# Patient Record
Sex: Male | Born: 1950 | Race: White | Hispanic: No | Marital: Married | Smoking: Current every day smoker
Health system: Southern US, Community
[De-identification: ages and names within clinical notes are randomized; demographics above are authoritative.]

## PROBLEM LIST (undated history)

## (undated) DIAGNOSIS — R7303 Prediabetes: Secondary | ICD-10-CM

## (undated) DIAGNOSIS — M1990 Unspecified osteoarthritis, unspecified site: Secondary | ICD-10-CM

## (undated) DIAGNOSIS — E559 Vitamin D deficiency, unspecified: Secondary | ICD-10-CM

## (undated) DIAGNOSIS — Z87442 Personal history of urinary calculi: Secondary | ICD-10-CM

## (undated) DIAGNOSIS — K649 Unspecified hemorrhoids: Secondary | ICD-10-CM

## (undated) DIAGNOSIS — F419 Anxiety disorder, unspecified: Secondary | ICD-10-CM

## (undated) DIAGNOSIS — L981 Factitial dermatitis: Secondary | ICD-10-CM

## (undated) DIAGNOSIS — E349 Endocrine disorder, unspecified: Secondary | ICD-10-CM

## (undated) DIAGNOSIS — I10 Essential (primary) hypertension: Principal | ICD-10-CM

## (undated) DIAGNOSIS — D126 Benign neoplasm of colon, unspecified: Secondary | ICD-10-CM

## (undated) HISTORY — DX: Morbid (severe) obesity due to excess calories: E66.01

## (undated) HISTORY — DX: Factitial dermatitis: L98.1

## (undated) HISTORY — DX: Benign neoplasm of colon, unspecified: D12.6

## (undated) HISTORY — PX: UMBILICAL HERNIA REPAIR: SHX196

## (undated) HISTORY — DX: Endocrine disorder, unspecified: E34.9

## (undated) HISTORY — DX: Vitamin D deficiency, unspecified: E55.9

## (undated) HISTORY — DX: Essential (primary) hypertension: I10

## (undated) HISTORY — DX: Prediabetes: R73.03

## (undated) HISTORY — PX: KIDNEY STONE SURGERY: SHX686

## (undated) HISTORY — DX: Unspecified hemorrhoids: K64.9

---

## 2009-04-01 LAB — HM COLONOSCOPY

## 2009-11-11 HISTORY — PX: SHOULDER SURGERY: SHX246

## 2011-06-29 ENCOUNTER — Ambulatory Visit (INDEPENDENT_AMBULATORY_CARE_PROVIDER_SITE_OTHER): Payer: BC Managed Care – PPO | Admitting: Family Medicine

## 2011-06-29 ENCOUNTER — Encounter: Payer: Self-pay | Admitting: Family Medicine

## 2011-06-29 DIAGNOSIS — T1490XA Injury, unspecified, initial encounter: Secondary | ICD-10-CM

## 2011-06-29 DIAGNOSIS — I10 Essential (primary) hypertension: Secondary | ICD-10-CM

## 2011-06-29 DIAGNOSIS — R21 Rash and other nonspecific skin eruption: Secondary | ICD-10-CM

## 2011-06-29 DIAGNOSIS — IMO0002 Reserved for concepts with insufficient information to code with codable children: Secondary | ICD-10-CM

## 2011-06-29 HISTORY — DX: Essential (primary) hypertension: I10

## 2011-06-29 NOTE — Progress Notes (Signed)
Subjective:    Patient ID: Erik Estrada, male    DOB: 02-Sep-1951, 60 y.o.   MRN: [id number]  HPI Recently dx with high blood pressure, put on med ( he can't remember the name). Says it made his muscle feel like jelly. Stayed on it for 1 mo. Also felt unfocused on the med.  Then given a new rx and he never had it filled because of the sun sensitivity adn he works outside all the time.    Has had scalp issues for years ago. Daughter had similar issues.  Currently using clindamycin gel which does seem to be working. He says he recently saw a dermatologist for this who prescribed this medication. He was noted if he could have a scan of his pituitary gland. He did some reading online and noted that this could cause a lot of the skin condition similar to what he has and to see if this a possible cause. He has had the symptoms from his 30 years.  He complains that he has gotten some metal fragments in his skin. 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 In fact there is a lesion near his umbilicus as well as a lesion on his left inner earlobe that he says he tried to cut open himself to get out the middle fragment. He actually takes a piece of the metal fragment or piece of paper to show me. He also saw a r dermatologist for for this who gave him a prescription medicated type of tape to use over the lesions for approximately one month to see if this would help.   Review of Systems  Constitutional: Negative for fever, diaphoresis and unexpected weight change.  HENT: Negative for hearing loss, rhinorrhea, sneezing and tinnitus.   Eyes: Negative for visual disturbance.  Respiratory: Negative for cough and wheezing.   Cardiovascular: Negative for chest pain and palpitations.  Gastrointestinal: Negative for nausea, vomiting, diarrhea and blood in stool.  Genitourinary: Negative for dysuria and discharge.  Musculoskeletal: Positive for myalgias. Negative for arthralgias.  Skin: Negative for rash.    Neurological: Negative for headaches.  Hematological: Negative for adenopathy.  Psychiatric/Behavioral: Positive for sleep disturbance. Negative for dysphoric mood. The patient is nervous/anxious.     BP 129/86  Pulse 89  Ht 5\' 4"  (1.626 m)  Wt 214 lb (97.07 kg)  BMI 36.73 kg/m2  SpO2 96%    Not on File  No past medical history on file.  Past Surgical History  Procedure Date  . Shoulder surgery December 2010    Left  . Kidney stone surgery   . Umbilical hernia repair     History   Social History  . Marital Status: Unknown    Spouse Name: Erik Estrada    Number of Children: 2  . Years of Education: N/A   Occupational History  . Auto Body Tech     [employer]   Social History Main Topics  . Smoking status: Current Everyday Smoker -- 2.0 packs/day    Types: Cigarettes  . Smokeless tobacco: Not on file  . Alcohol Use: No  . Drug Use: No  . Sexually Active: Yes -- Male partner(s)   Other Topics Concern  . Not on file   Social History Narrative   2 caffeinated drinks daily. No regular exercise he does have a physical job.    Family History  Problem Relation Age of Onset  . Colon cancer Brother   . Hypertension Mother   . Stroke Brother  Mr. Erik Estrada does not currently have medications on file.      Objective:   Physical Exam  Constitutional: He is oriented to person, place, and time. He appears well-developed and well-nourished.  HENT:  Head: Normocephalic and atraumatic.  Neck: Neck supple. No thyromegaly present.  Cardiovascular: Normal rate, regular rhythm and normal heart sounds.        No carotid bruits  Pulmonary/Chest: Effort normal and breath sounds normal.  Lymphadenopathy:    He has no cervical adenopathy.  Neurological: He is alert and oriented to person, place, and time.  Skin: Skin is warm and dry.       On his scalp he has several erythematous papular lesions that he is clearly scratch to have small scabs in the center. No active  sign of impetigo or infection or drainage. On his left inner earlobe he has a slice mark which appears to be actually down to the cartilage. It is very tender to touch but no sign of infection or drainage. Around the umbilicus he actually has 2 lesions that appear somewhat ulcerated patch he has also tried to cut to remove metal fragments. There again no sign of active infection or cellulitis. He also has a smaller lesion on his left jaw that is erythematous and is clearly been taking as well.  Psychiatric: He has a normal mood and affect. His behavior is normal.          Assessment & Plan:  Scalp rash-I really think that this is self-induced. I do think the clindamycin gel helps mainly because he scratches at the areas and creates mild infections and hair follicles. I do think he has some scarring as well. I would like him to start in acetylcysteine 600 mg twice a day to see if this helps her for the next month. He can certainly continue with the clindamycin topical treatment as well. I explained to him that I do not think this is related to his pituitary gland. He was somewhat fearful that he could have a tumor but I explained that after 30 years but that tumor would have caused a lot of problems in his neurologic system including seizures, vision changes, abnormal changes as well. Thus it is extraordinarily unlikely for this to be related to his pituitary gland.  Foreign body-metal fragments-at this point time I don't see any evidence of metal fragments and certainly they could be embedded in the skin. I do think he should call the dermatologist instructions to continue using the taper of the lesions and to not pick at them. I explained to him that oftentimes foreign bodies will be push themselves out of the body on their own. And certainly in the next month he would be able to tell this. Also let him know that we could get an x-ray of his ear if he is still worried about a metal fragment. I explained  him that this point is still very tender because it is trying to heal and it is an open wound.

## 2011-06-29 NOTE — Assessment & Plan Note (Signed)
His blood pressure is just borderline today. We discussed following the DASH diet. Followup in one month for repeat check. If it is elevated at the next visit then I would like to start medication. We will try avoid medications that will increase his sun sensitivity since he does work outside. I also want him to get me the name of the first blood pressure prescription so that I don't prescribe the exact same 1. We will try to get a copy of his records.

## 2011-06-29 NOTE — Patient Instructions (Addendum)
NAC (N-acetyl cysteine) 600 twice a day for one month.   Can try B 12 complex.  DASH diet (google that .nih/gov) - Goes over a low sodium diet.   Work on weight loss.

## 2011-07-20 ENCOUNTER — Encounter: Payer: Self-pay | Admitting: Family Medicine

## 2011-07-20 DIAGNOSIS — Z720 Tobacco use: Secondary | ICD-10-CM | POA: Insufficient documentation

## 2011-07-20 DIAGNOSIS — E785 Hyperlipidemia, unspecified: Secondary | ICD-10-CM | POA: Insufficient documentation

## 2011-07-20 DIAGNOSIS — D72829 Elevated white blood cell count, unspecified: Secondary | ICD-10-CM | POA: Insufficient documentation

## 2011-07-20 DIAGNOSIS — E291 Testicular hypofunction: Secondary | ICD-10-CM | POA: Insufficient documentation

## 2011-08-04 ENCOUNTER — Ambulatory Visit (INDEPENDENT_AMBULATORY_CARE_PROVIDER_SITE_OTHER): Payer: BC Managed Care – PPO | Admitting: Family Medicine

## 2011-08-04 ENCOUNTER — Encounter: Payer: Self-pay | Admitting: Family Medicine

## 2011-08-04 VITALS — BP 150/87 | HR 77 | Wt 217.0 lb

## 2011-08-04 DIAGNOSIS — Z0000 Encounter for general adult medical examination without abnormal findings: Secondary | ICD-10-CM

## 2011-08-04 DIAGNOSIS — I10 Essential (primary) hypertension: Secondary | ICD-10-CM

## 2011-08-04 LAB — TSH: TSH: 1.502 u[IU]/mL (ref 0.350–4.500)

## 2011-08-04 LAB — LIPID PANEL
HDL: 40 mg/dL (ref >39)
Total CHOL/HDL Ratio: 5 Ratio

## 2011-08-04 MED ORDER — LISINOPRIL 20 MG PO TABS: 20.0000 mg | ORAL_TABLET | Freq: Every day | ORAL | Status: DC

## 2011-08-04 MED ORDER — HYDROCHLOROTHIAZIDE 25 MG PO TABS: 25.0000 mg | ORAL_TABLET | Freq: Every day | ORAL | Status: DC

## 2011-08-04 NOTE — Progress Notes (Signed)
  Subjective:    Patient ID: Erik Estrada, male    DOB: January 13, 1951, 60 y.o.   MRN: [id number]  HPI Here for CPE.  Doing well overall.  No specific concerns today.  BP still elevated. No regular exercise.     Review of Systems Comprehensive ROS is neg.     BP 150/87  Pulse 77  Wt 217 lb (98.431 kg)    Allergies  Allergen Reactions  . Lisinopril Other (See Comments)    Muscle weakness    No past medical history on file.  Past Surgical History  Procedure Date  . Shoulder surgery December 2010    Left  . Kidney stone surgery   . Umbilical hernia repair     History   Social History  . Marital Status: Unknown    Spouse Name: Erik Estrada    Number of Children: 2  . Years of Education: N/A   Occupational History  . Auto Body Tech     [employer]   Social History Main Topics  . Smoking status: Current Everyday Smoker -- 2.0 packs/day    Types: Cigarettes  . Smokeless tobacco: Not on file  . Alcohol Use: No  . Drug Use: No  . Sexually Active: Yes -- Male partner(s)   Other Topics Concern  . Not on file   Social History Narrative   2 caffeinated drinks daily. No regular exercise he does have a physical job.    Family History  Problem Relation Age of Onset  . Colon cancer Brother   . Hypertension Mother   . Stroke Brother     Erik Estrada does not currently have medications on file.  Objective:   Physical Exam  Constitutional: He is oriented to person, place, and time. He appears well-developed and well-nourished.       Overweight.   HENT:  Head: Normocephalic and atraumatic.  Right Ear: External ear normal.  Left Ear: External ear normal.  Nose: Nose normal.  Mouth/Throat: Oropharynx is clear and moist.  Eyes: Conjunctivae and EOM are normal. Pupils are equal, round, and reactive to light.  Neck: Normal range of motion. Neck supple. No thyromegaly present.  Cardiovascular: Normal rate, regular rhythm, normal heart sounds and intact distal  pulses.   Pulmonary/Chest: Effort normal and breath sounds normal.  Abdominal: Soft. Bowel sounds are normal. He exhibits no distension and no mass. There is no tenderness. There is no rebound and no guarding.  Musculoskeletal: Normal range of motion.  Lymphadenopathy:    He has no cervical adenopathy.  Neurological: He is alert and oriented to person, place, and time. He has normal reflexes.  Skin: Skin is warm and dry.  Psychiatric: He has a normal mood and affect. His behavior is normal. Judgment and thought content normal.          Assessment & Plan:  Start a regular exercise program and make sure you are eating a healthy diet Try to eat 4 servings of dairy a day or take a calcium supplement (500mg  twice a day). Your vaccines are up to date.  Due fore screening labs. Did have coffee wth milk this AM

## 2011-08-04 NOTE — Patient Instructions (Signed)
Start a regular exercise program and make sure you are eating a healthy diet Try to eat 4 servings of dairy a day or take a calcium supplement (500mg twice a day). Your vaccines are up to date.   

## 2011-08-04 NOTE — Assessment & Plan Note (Signed)
Discussed need to start BP med. Will start lisinopril and f/u in 1 months.  Will get gasting labs today. Discussed DASH diet.  Work on weight loss as well.

## 2011-08-05 ENCOUNTER — Telehealth: Payer: Self-pay | Admitting: Family Medicine

## 2011-08-05 LAB — COMPLETE METABOLIC PANEL WITH GFR
ALT: 16 U/L (ref 0–53)
Alkaline Phosphatase: 64 U/L (ref 39–117)
GFR, Est Non African American: >60 mL/min (ref >60)
Glucose, Bld: 106 mg/dL — ABNORMAL HIGH (ref 70–99)
Sodium: 136 mEq/L (ref 135–145)
Total Bilirubin: 0.7 mg/dL (ref 0.3–1.2)
Total Protein: 7.5 g/dL (ref 6.0–8.3)

## 2011-08-05 NOTE — Telephone Encounter (Signed)
Call pt: thyroid is normal. Blood sugar is elevated in teh prediebetic range. Ecletrolytes are normal Chol is high. I really want him to work on weight loss and lets recheck LDL chol in 6 mo.

## 2011-08-05 NOTE — Telephone Encounter (Signed)
Pt notified of results. KJ LPN 

## 2011-09-06 ENCOUNTER — Ambulatory Visit (INDEPENDENT_AMBULATORY_CARE_PROVIDER_SITE_OTHER): Payer: BC Managed Care – PPO | Admitting: Family Medicine

## 2011-09-06 ENCOUNTER — Encounter: Payer: Self-pay | Admitting: Family Medicine

## 2011-09-06 DIAGNOSIS — I10 Essential (primary) hypertension: Secondary | ICD-10-CM

## 2011-09-06 NOTE — Progress Notes (Signed)
  Subjective:    Patient ID: Erik Estrada, male    DOB: 1951/11/01, 60 y.o.   MRN: [id number]  HPI HTN- He never filled the BP pill. Has started health smoothies and has dec his sugar intake.  Says the shakes are supposed ot help his cholesterol and BP. Never filled the HCTZ.    Vitamin B really helped the spots on his scalp.  Says feels hte metal pieces are coming out of his skin in his ears, cheeks and and abdomen.    Review of Systems     Objective:   Physical Exam  Constitutional: He is oriented to person, place, and time. He appears well-developed and well-nourished.  HENT:  Head: Normocephalic and atraumatic.  Cardiovascular: Normal rate, regular rhythm and normal heart sounds.   Pulmonary/Chest: Effort normal and breath sounds normal.  Neurological: He is alert and oriented to person, place, and time.  Skin: Skin is warm and dry.  Psychiatric: He has a normal mood and affect. His behavior is normal.          Assessment & Plan:  HTN - Will continue to work on diet and exericise and then recheck in 2 months.  His BP is btter today and has lost some weigh  Ht is still picking at his skin and causing trauma. None of the lesions look infected today. He never tried the N-acetyl cysteine that I has prescribed him   He is clearly non-compliant.   Declined flu vaccine.

## 2011-09-28 ENCOUNTER — Telehealth: Payer: Self-pay | Admitting: Family Medicine

## 2011-09-28 NOTE — Telephone Encounter (Signed)
Pt's daughter called and said her father is experiencing blood in his urine and needs an appt.  Pt also needs another appt for fine bits metal in his scalp and facial area. Plan:  Nurse appt made for 09-29-11, and an appt made with Dr. Linford Arnold for 10-10-11. Jarvis Newcomer, LPN [facility]

## 2011-09-29 ENCOUNTER — Telehealth: Payer: Self-pay | Admitting: Family Medicine

## 2011-09-29 ENCOUNTER — Ambulatory Visit (INDEPENDENT_AMBULATORY_CARE_PROVIDER_SITE_OTHER): Payer: BC Managed Care – PPO | Admitting: Family Medicine

## 2011-09-29 VITALS — BP 150/87 | HR 73 | Temp 98.7°F | Ht 64.0 in

## 2011-09-29 DIAGNOSIS — R319 Hematuria, unspecified: Secondary | ICD-10-CM

## 2011-09-29 LAB — POCT URINALYSIS DIPSTICK
Bilirubin, UA: NEGATIVE
Ketones, UA: NEGATIVE
Nitrite, UA: NEGATIVE
pH, UA: 7

## 2011-09-29 NOTE — Progress Notes (Signed)
  Subjective:    Patient ID: Erik Estrada, male    DOB: 11-06-1951, 60 y.o.   MRN: [id number]  HPI   Blood in urine since Monday. Burning with urination Monday only. Denies any other sxs Review of Systems     Objective:   Physical Exam        Assessment & Plan:

## 2011-09-29 NOTE — Telephone Encounter (Signed)
Pt calling because he had received a call from Avon Gully, CMA earlier today. Plan:  Sue Lush was trying to let the pt know that the urinalysis showed large blood and that she was going ahead and sending for a UC.  Pt voiced understanding. Jarvis Newcomer, LPN [facility]

## 2011-10-02 NOTE — Patient Instructions (Signed)
Will need to treat if urine culture is positive. May need urological referral.

## 2011-10-03 ENCOUNTER — Telehealth: Payer: Self-pay | Admitting: Family Medicine

## 2011-10-03 DIAGNOSIS — R361 Hematospermia: Secondary | ICD-10-CM

## 2011-10-03 MED ORDER — SULFAMETHOXAZOLE-TRIMETHOPRIM 800-160 MG PO TABS: 1.0000 | ORAL_TABLET | Freq: Two times a day (BID) | ORAL | Status: AC

## 2011-10-03 NOTE — Telephone Encounter (Signed)
Pt called for results of his urine culture done last week.  Pt has noted blood in his semen over the weekend. Pt is also having a downward pulling sensation with his LT testicle. Plan:  Called [facility] and Darl Pikes downstairs will call client servs to check on culture.  In the interim will notify Dr. Linford Arnold to see if we can work this pt back on the schedule today or tomorrow to be seen or send to UC. Routed to Dr. Marlyne Beards, LPN [facility]

## 2011-10-03 NOTE — Telephone Encounter (Signed)
Called and spoke with Darl Pikes at [facility] and she called client servs and they do not have the urine culture.  Please advise. Routed to Dr. Linford Arnold. Jarvis Newcomer, LPN [facility]

## 2011-10-03 NOTE — Telephone Encounter (Signed)
LMOM

## 2011-10-03 NOTE — Telephone Encounter (Signed)
Will have to recollect urine specimen and send for culture. I will call in an antibiotic and refer him to urology. Bactrim for 6 weeks. He will need to drop off another urine specimen starting the antibiotic. Will put in order so can go directly downstairs for urine and culture.

## 2011-10-06 ENCOUNTER — Telehealth: Payer: Self-pay | Admitting: *Deleted

## 2011-10-06 LAB — URINE CULTURE

## 2011-10-06 MED ORDER — NITROFURANTOIN MONOHYD MACRO 100 MG PO CAPS: 100.0000 mg | ORAL_CAPSULE | Freq: Two times a day (BID) | ORAL | Status: AC

## 2011-10-06 NOTE — Telephone Encounter (Signed)
Per Dr. Thurmond Butts urine culture was not sensitive to bactrim so he wants to change rx to macrobid. Called and left message on pt's vm and sent rx

## 2011-10-06 NOTE — Telephone Encounter (Signed)
Message copied by Wyline Beady on Thu Oct 06, 2011  1:20 PM ------      Message from: Hassan Rowan      Created: Thu Oct 06, 2011  1:06 PM       Sue Lush can we place him on macrobid 100 mg 1 po twice a day for 7 days . But since he had blood he needs a repeat UA to prove cure.

## 2011-10-06 NOTE — Telephone Encounter (Signed)
Dr. Thurmond Butts i put in urine culture for this patient and Dr. Linford Arnold placed him on Bactrim and pt was notified

## 2011-10-10 ENCOUNTER — Ambulatory Visit: Payer: BC Managed Care – PPO | Admitting: Family Medicine

## 2011-11-07 ENCOUNTER — Ambulatory Visit: Payer: BC Managed Care – PPO | Admitting: Family Medicine

## 2012-05-16 DIAGNOSIS — R7303 Prediabetes: Secondary | ICD-10-CM

## 2012-05-16 DIAGNOSIS — Z800 Family history of malignant neoplasm of digestive organs: Secondary | ICD-10-CM | POA: Insufficient documentation

## 2012-05-16 HISTORY — DX: Prediabetes: R73.03

## 2015-11-30 LAB — HM COLONOSCOPY

## 2016-07-27 DIAGNOSIS — L981 Factitial dermatitis: Secondary | ICD-10-CM

## 2016-07-27 HISTORY — DX: Factitial dermatitis: L98.1

## 2017-08-01 DIAGNOSIS — E559 Vitamin D deficiency, unspecified: Secondary | ICD-10-CM

## 2017-08-01 HISTORY — DX: Vitamin D deficiency, unspecified: E55.9

## 2018-07-31 LAB — PSA: PSA: 4.5

## 2018-08-01 DIAGNOSIS — K649 Unspecified hemorrhoids: Secondary | ICD-10-CM

## 2018-08-01 HISTORY — DX: Unspecified hemorrhoids: K64.9

## 2018-09-10 ENCOUNTER — Other Ambulatory Visit: Payer: Self-pay | Admitting: Urology

## 2018-09-11 LAB — PSA: Prostate Specific Ag, Serum: 4.7 ng/mL — ABNORMAL HIGH (ref 0.0–4.0)

## 2018-10-12 ENCOUNTER — Encounter: Payer: Self-pay | Admitting: Family Medicine

## 2018-10-12 ENCOUNTER — Ambulatory Visit (INDEPENDENT_AMBULATORY_CARE_PROVIDER_SITE_OTHER): Payer: 59 | Admitting: Family Medicine

## 2018-10-12 VITALS — BP 131/63 | HR 75 | Wt 219.0 lb

## 2018-10-12 DIAGNOSIS — E782 Mixed hyperlipidemia: Secondary | ICD-10-CM

## 2018-10-12 DIAGNOSIS — M7061 Trochanteric bursitis, right hip: Secondary | ICD-10-CM | POA: Insufficient documentation

## 2018-10-12 DIAGNOSIS — Z720 Tobacco use: Secondary | ICD-10-CM

## 2018-10-12 DIAGNOSIS — R7303 Prediabetes: Secondary | ICD-10-CM | POA: Diagnosis not present

## 2018-10-12 DIAGNOSIS — E349 Endocrine disorder, unspecified: Secondary | ICD-10-CM | POA: Insufficient documentation

## 2018-10-12 DIAGNOSIS — E559 Vitamin D deficiency, unspecified: Secondary | ICD-10-CM

## 2018-10-12 DIAGNOSIS — L981 Factitial dermatitis: Secondary | ICD-10-CM

## 2018-10-12 DIAGNOSIS — E291 Testicular hypofunction: Secondary | ICD-10-CM

## 2018-10-12 DIAGNOSIS — R972 Elevated prostate specific antigen [PSA]: Secondary | ICD-10-CM | POA: Diagnosis not present

## 2018-10-12 DIAGNOSIS — M7062 Trochanteric bursitis, left hip: Secondary | ICD-10-CM | POA: Insufficient documentation

## 2018-10-12 DIAGNOSIS — I10 Essential (primary) hypertension: Secondary | ICD-10-CM

## 2018-10-12 DIAGNOSIS — E785 Hyperlipidemia, unspecified: Secondary | ICD-10-CM | POA: Insufficient documentation

## 2018-10-12 DIAGNOSIS — R5383 Other fatigue: Secondary | ICD-10-CM

## 2018-10-12 DIAGNOSIS — Z800 Family history of malignant neoplasm of digestive organs: Secondary | ICD-10-CM

## 2018-10-12 HISTORY — DX: Morbid (severe) obesity due to excess calories: E66.01

## 2018-10-12 HISTORY — DX: Endocrine disorder, unspecified: E34.9

## 2018-10-12 MED ORDER — TRIAMCINOLONE ACETONIDE 0.1 % EX OINT: 1.0000 "application " | TOPICAL_OINTMENT | Freq: Two times a day (BID) | CUTANEOUS | 6 refills | Status: DC

## 2018-10-12 NOTE — Patient Instructions (Addendum)
Thank you for coming in today. Get labs now.  You should hear from the urologist soon.  If you dont hear anything in 1 week let me know.  Recheck in 1 month.  Keep the wounds covered with Vaseline.  Use triamcinolone sparingly on the itchy wound.   Do the side leg raises 30 reps 2x daily.  Do the cross over stretch

## 2018-10-12 NOTE — Progress Notes (Signed)
Erik Estrada is a 67 y.o. male who presents to [facility] today for establish care and discuss multiple medical problems. He has been seen by several providers in the past but up until recently did not have health insurance.  He just was accepted to Medicare and today is his first visit to reestablish health care.  He has several outstanding issues some are worse than others.  1) elevated PSA.  Erik Estrada has a history of acute urinary retention seen in urgent care on August 19.  He was started on Flomax which seemed to help and is no longer taking it and doing reasonably well from a urinary standpoint.  PSA was assessed at that visit and it had increased from a baseline of about 2 up to about 4.5.  He denies any pelvic pain weight loss fevers or chills or significant bone pain.  He agrees that this is an important issue and should be followed up.  2) wounds on skin.  Erik Estrada has an extensive history of multiple wounds and skin irritation on his extremities and face and ears.  He attributes these wounds to retained foreign bodies.  He was a Building control surveyor and notes that multiple times he did have incidences of the cut off wheel and welding fragments hitting him in the arms and face causing small wounds.  He suspects that there is still pieces of the metal fragments or ceramic abrasive fragments in his skin causing irritation.  He notes he tends to pick at his skin in the hopes of removing these bothersome fragments and foreign bodies.  He notes his skin will become irritated and tingly or itchy and once there is a wound will become irritated and discharge.  He tends to treat the wounds using frequent applications of hydrogen peroxide.  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  He said trials of gabapentin which made him feel sleepy.  He was unable to afford Lyrica.   3)  hypogonadism: Erik Estrada notes a history of decreased testosterone and low libido.  He had labs for this back in 2011 and describes being on what sounds like topical testosterone which did help.  As he lost his insurance is not been on this and notes that he has low libido and fatigue.  4) hip pain bilaterally.  Erik Estrada notes pain in his bilateral lateral hips.  This is been ongoing for months without injury.  Pain is worse with activity standing from a seated position.  He denies any radiating pain weakness or numbness.  He denies any significant calf pain with exertion.  Erik Estrada has several other medical problems including hypertension managed with lisinopril, hyperlipidemia having refused statin therapy due to concern of side effects, vitamin D deficiency currently taking vitamin D supplementation.  Additionally he has [facility]  He is on a 5-year recall.  ROS as above: No headache, visual changes, nausea, vomiting, diarrhea, , dizziness, abdominal pain, , fevers, chills, night sweats, weight loss, swollen lymph nodes, joint swelling, muscle aches, chest pain, shortness of breath, mood changes, visual or auditory hallucinations.  Positive for hearing change, foot swelling, cough, constipation, muscle and joint pain, itching, difficulty urinating, numbness and tingling, and sleep problems  Exam:  BP 131/63   Pulse 75   Wt 219 lb (99.3 kg)   BMI 37.59 kg/m  Wt Readings from Last 5 Encounters:  10/12/18 219  lb (99.3 kg)  09/06/11 211 lb (95.7 kg)  08/04/11 217 lb (98.4 kg)  06/29/11 214 lb (97.1 kg)    Gen: Well NAD HEENT: EOMI,  MMM Lungs: Normal work of breathing. CTABL Heart: RRR no MRG Abd: NABS, Soft. Nondistended, Nontender Exts: Brisk capillary refill, warm and well perfused.  Skin: Multiple well healed scars on trunk and extremities with hyperpigmentation.  Multiple current open areas of excoriation and shallow  ulcerations on the left forearm and right ear and right lateral face.  No foreign bodies visible.  No surrounding erythema. Psych alert and oriented normal speech thought process and affect. MSK: Hips normal-appearing bilaterally with normal motion. Right hip tender to palpation greater trochanter.  Hip abduction strength diminished 4/5.  Pain with resisted abduction. Left hip: Normal-appearing tender palpation greater trochanter.  Hip abduction strength diminished 4+/5.  Pain with resisted abduction. Feet normal-appearing bilaterally with intact pulses capillary refill and sensation to monofilament testing.  Lab and Radiology Results Labs and radiology reviewed in care everywhere. Will be abstracted.  PSA trend 4.5 July 30, 2018, 2.5 August 03, 2015, 2.4 Apr 28, 2016, 1.7 July 01, 2014    Assessment and Plan: 67 y.o. male with establish care with multiple active and chronic medical problems.  1) elevated PSA.  Patient had a recent history of significantly elevated PSA compared to his baseline associated with acute urinary retention.  He is feeling better now however I am concerned.  Plan to recheck PSA and refer to urology.  Discussed expected outcome including possible serial monitoring versus possible prostate biopsy.  Patient declined rectal exam today.  2) low libido and hypogonadism: Patient has existing diagnosis of low testosterone from 2011.  He had improvement with testosterone supplementation.  Plan to recheck testosterone along with other basic fasting labs.  If PSA is found to be non-concerning for malignancy reasonable to consider testosterone supplementation in the future.  3) skin wounds.  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  I am very doubtful for any retained foreign body.  We discussed simple pragmatic approach.  Recommend patient discontinue hydrogen peroxide as that is interfering with wound healing.  Recommend continued use of topical Vaseline as this  will help with wound healing.  Prescribed triamcinolone ointment.  Use sparingly for itching sensations.  Noted that this may interfere with some wound healing however I believe he will likely do better overall with less itchy irritated skin and will likely have fewer wounds. Severe issue.   4) hip pain: Patient has bilateral lateral hip pain likely trochanteric bursitis versus hip abductor tendinopathy.  Plan for home exercise program including stretching and strengthening.  Erik Estrada has multiple other chronic medical issues that are on the back burner with the first visit.  These include hypertension obesity smoking further depression and anxiety evaluation, vitamin D deficiency, fatigue and hyperlipidemia with increased CVD risk.  Plan to check basic fasting labs below for these issues.  Additionally will slowly work our way through all of these chronic issues over the next few months.  Plan on rechecking in 1 month or sooner if needed.  Will obtain medical records not currently available in care everywhere.   Orders Placed This Encounter  Procedures  . CBC  . COMPLETE METABOLIC PANEL WITH GFR  . Lipid Panel w/reflex Direct LDL  . Hemoglobin A1c  . TSH  . PSA  . Testosterone  . VITAMIN D 25 Hydroxy (Vit-D Deficiency, Fractures)   Meds ordered this encounter  Medications  .  triamcinolone ointment (KENALOG) 0.1 %    Sig: Apply 1 application topically 2 (two) times daily. To affected areas    Dispense:  60 g    Refill:  6     Historical information moved to improve visibility of documentation.  Past Medical History:  Diagnosis Date  . Essential hypertension, benign 06/29/2011  . Hemorrhoids 08/01/2018  . Morbid obesity ([place]) 10/12/2018  . 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 Prediabetes 05/16/2012  . Testosterone deficiency 10/12/2018  . Vitamin D deficiency 08/01/2017   Past Surgical History:  Procedure Laterality Date  . KIDNEY STONE SURGERY    . SHOULDER SURGERY  December 2010    Left  . UMBILICAL HERNIA REPAIR     Social History   Tobacco Use  . Smoking status: Current Every Day Smoker    Packs/day: 2.00    Types: Cigarettes  . Smokeless tobacco: Never Used  Substance Use Topics  . Alcohol use: No   family history includes Colon cancer in his brother; Hypertension in his mother; Parkinson's disease in his mother; Stroke in his brother.  Medications: Current Outpatient Medications  Medication Sig Dispense Refill  . lisinopril (PRINIVIL,ZESTRIL) 5 MG tablet TK 1 T PO D  1  . triamcinolone ointment (KENALOG) 0.1 % Apply 1 application topically 2 (two) times daily. To affected areas 60 g 6  . Vitamin D, Cholecalciferol, 1000 units TABS Take 4,000 Units by mouth daily.     No current facility-administered medications for this visit.    Allergies  Allergen Reactions  . Lisinopril Other (See Comments)    Muscle weakness  . Hydrochlorothiazide Other (See Comments)    Fatigue     Discussed warning signs or symptoms. Please see discharge instructions. Patient expresses understanding.

## 2018-10-12 NOTE — Progress Notes (Signed)
Note duplication error 

## 2018-10-13 LAB — COMPLETE METABOLIC PANEL WITH GFR
AG Ratio: 1.6 (calc) (ref 1.0–2.5)
ALBUMIN MSPROF: 4.3 g/dL (ref 3.6–5.1)
ALT: 11 U/L (ref 9–46)
AST: 13 U/L (ref 10–35)
Alkaline phosphatase (APISO): 65 U/L (ref 40–115)
BILIRUBIN TOTAL: 0.6 mg/dL (ref 0.2–1.2)
BUN: 8 mg/dL (ref 7–25)
CHLORIDE: 104 mmol/L (ref 98–110)
CO2: 24 mmol/L (ref 20–32)
Calcium: 9.5 mg/dL (ref 8.6–10.3)
Creat: 1.04 mg/dL (ref 0.70–1.25)
GFR, EST AFRICAN AMERICAN: 88 mL/min/{1.73_m2} (ref >=60)
GFR, EST NON AFRICAN AMERICAN: 76 mL/min/{1.73_m2} (ref >=60)
GLUCOSE: 98 mg/dL (ref 65–139)
Globulin: 2.7 g/dL (calc) (ref 1.9–3.7)
Potassium: 4.4 mmol/L (ref 3.5–5.3)
Sodium: 138 mmol/L (ref 135–146)
TOTAL PROTEIN: 7 g/dL (ref 6.1–8.1)

## 2018-10-13 LAB — CBC
HCT: 44.9 % (ref 38.5–50.0)
HEMOGLOBIN: 15.1 g/dL (ref 13.2–17.1)
MCH: 30.9 pg (ref 27.0–33.0)
MCHC: 33.6 g/dL (ref 32.0–36.0)
MCV: 91.8 fL (ref 80.0–100.0)
MPV: 11 fL (ref 7.5–12.5)
Platelets: 250 10*3/uL (ref 140–400)
RBC: 4.89 10*6/uL (ref 4.20–5.80)
RDW: 13 % (ref 11.0–15.0)
WBC: 8.9 10*3/uL (ref 3.8–10.8)

## 2018-10-13 LAB — VITAMIN D 25 HYDROXY (VIT D DEFICIENCY, FRACTURES): Vit D, 25-Hydroxy: 49 ng/mL (ref 30–100)

## 2018-10-13 LAB — LIPID PANEL W/REFLEX DIRECT LDL
CHOL/HDL RATIO: 6.5 (calc) — AB (ref <5.0)
Cholesterol: 196 mg/dL (ref <200)
HDL: 30 mg/dL — ABNORMAL LOW (ref >40)
LDL CHOLESTEROL (CALC): 133 mg/dL — AB
NON-HDL CHOLESTEROL (CALC): 166 mg/dL — AB (ref <130)
Triglycerides: 192 mg/dL — ABNORMAL HIGH (ref <150)

## 2018-10-13 LAB — TSH: TSH: 3.21 mIU/L (ref 0.40–4.50)

## 2018-10-13 LAB — HEMOGLOBIN A1C
HEMOGLOBIN A1C: 5.9 %{Hb} — AB (ref <5.7)
MEAN PLASMA GLUCOSE: 123 (calc)
eAG (mmol/L): 6.8 (calc)

## 2018-10-13 LAB — TESTOSTERONE: Testosterone: 374 ng/dL (ref 250–827)

## 2018-10-13 LAB — PSA: PSA: 4 ng/mL (ref <=4.0)

## 2018-10-16 MED ORDER — ATORVASTATIN CALCIUM 40 MG PO TABS: 40.0000 mg | ORAL_TABLET | Freq: Every day | ORAL | 3 refills | Status: DC

## 2018-10-16 NOTE — Addendum Note (Signed)
Addended by: Gregor Hams on: 10/16/2018 07:26 AM   Modules accepted: Orders

## 2018-10-17 ENCOUNTER — Encounter: Payer: Self-pay | Admitting: Family Medicine

## 2018-10-17 ENCOUNTER — Telehealth: Payer: Self-pay | Admitting: Family Medicine

## 2018-10-17 DIAGNOSIS — D126 Benign neoplasm of colon, unspecified: Secondary | ICD-10-CM

## 2018-10-17 HISTORY — DX: Benign neoplasm of colon, unspecified: D12.6

## 2018-10-17 NOTE — Telephone Encounter (Signed)
Received colonoscopy records from 2016.  Plan for recheck in 5 years.  Pathology showed tubular adenoma

## 2018-11-12 ENCOUNTER — Encounter: Payer: Self-pay | Admitting: Family Medicine

## 2018-11-12 ENCOUNTER — Ambulatory Visit (INDEPENDENT_AMBULATORY_CARE_PROVIDER_SITE_OTHER): Payer: Medicare HMO | Admitting: Family Medicine

## 2018-11-12 VITALS — BP 157/68 | HR 100 | Ht 64.0 in | Wt 217.0 lb

## 2018-11-12 DIAGNOSIS — L981 Factitial dermatitis: Secondary | ICD-10-CM | POA: Diagnosis not present

## 2018-11-12 DIAGNOSIS — Z720 Tobacco use: Secondary | ICD-10-CM

## 2018-11-12 DIAGNOSIS — E782 Mixed hyperlipidemia: Secondary | ICD-10-CM | POA: Diagnosis not present

## 2018-11-12 DIAGNOSIS — I10 Essential (primary) hypertension: Secondary | ICD-10-CM | POA: Diagnosis not present

## 2018-11-12 MED ORDER — LISINOPRIL 10 MG PO TABS: 10.0000 mg | ORAL_TABLET | Freq: Every day | ORAL | 1 refills | Status: DC

## 2018-11-12 NOTE — Patient Instructions (Addendum)
Thank you for coming in today. Increase lisinopril to 10mg  daily. It works best at bedtime but take whenever you can.  I do recommend that you take your atorvastatin for cholesterol and to reduce your risk of hear attack and stroke.  Follow up with Urology about prostate.    Recheck in 1 month.

## 2018-11-12 NOTE — Progress Notes (Signed)
Erik Estrada is a 67 y.o. male who presents to [facility] today for follow-up hypertension and hyperlipidemia.  Erik Estrada was seen for first encounter about a month ago.  He had multiple medical problems including neurotic excoriations, hypertension, hyperlipidemia, tobacco abuse.  He is skin lesions were treated with avoiding hydrogen peroxide, of trying to avoid picking, mild triamcinolone cream for itching, and barrier ointment/Vaseline as needed.  He notes that he has had significant improvement but not full resolution of symptoms.  Hypertension.  Erik Estrada was previously prescribed 5 mg of lisinopril.  He notes that he takes it inconsistently but does tolerated it he is not sure if he took it recently.  No chest pain palpitations shortness of breath lightheadedness or dizziness.  Hyperlipidemia: [name] has increased CVD risk of around 35% due to smoking lipids and hypertension.  He was prescribed atorvastatin but did not take it at the last visit because he is concerned about possible side effects including liver inflammation.  He is not sure if he is ever tried statins before.   ROS as above:  Exam:  BP (!) 157/68   Pulse 100   Ht 5\' 4"  (1.626 m)   Wt 217 lb (98.4 kg)   BMI 37.25 kg/m  Wt Readings from Last 5 Encounters:  11/12/18 217 lb (98.4 kg)  10/12/18 219 lb (99.3 kg)  09/06/11 211 lb (95.7 kg)  08/04/11 217 lb (98.4 kg)  06/29/11 214 lb (97.1 kg)    Gen: Well NAD HEENT: EOMI,  MMM Lungs: Normal work of breathing. CTABL Heart: RRR no MRG Abd: NABS, Soft. Nondistended, Nontender Exts: Brisk capillary refill, warm and well perfused.  Skin: Several excoriations and wounds on right face and left dorsal hand improved compared to previous visit. Psych alert and oriented normal speech thought process and affect.  Depression screen [facility] 2/9 11/12/2018  10/12/2018  Decreased Interest 3 1  Down, Depressed, Hopeless 0 3  PHQ - 2 Score 3 4  Altered sleeping 2 3  Tired, decreased energy 3 2  Change in appetite 0 1  Feeling bad or failure about yourself  0 3  Trouble concentrating 0 2  Moving slowly or fidgety/restless 0 1  Suicidal thoughts 0 1  PHQ-9 Score 8 17  Difficult doing work/chores Not difficult at all Somewhat difficult    GAD 7 : Generalized Anxiety Score 11/12/2018 10/12/2018  Nervous, Anxious, on Edge 0 3  Control/stop worrying 0 3  Worry too much - different things 0 3  Trouble relaxing 0 3  Restless 0 1  Easily annoyed or irritable 0 2  Afraid - awful might happen 0 2  Total GAD 7 Score 0 17  Anxiety Difficulty Not difficult at all Somewhat difficult      Lab and Radiology Results   Chemistry      Component Value Date/Time   NA 138 10/12/2018 1204   K 4.4 10/12/2018 1204   CL 104 10/12/2018 1204   CO2 24 10/12/2018 1204   BUN 8 10/12/2018 1204   CREATININE 1.04 10/12/2018 1204      Component Value Date/Time   CALCIUM 9.5 10/12/2018 1204   ALKPHOS 64 08/04/2011 0920   AST 13 10/12/2018 1204   ALT 11 10/12/2018 1204   BILITOT 0.6 10/12/2018 1204      Lab Results  Component Value Date   CHOL 196 10/12/2018   HDL 30 (L) 10/12/2018   LDLCALC 133 (H)  10/12/2018   TRIG 192 (H) 10/12/2018   CHOLHDL 6.5 (H) 10/12/2018    The 10-year ASCVD risk score Erik Estrada DC Jr., et al., 2013) is: 35.4%   Values used to calculate the score:     Age: 60 years     Sex: Male     Is Non-Hispanic African American: No     Diabetic: No     Tobacco smoker: Yes     Systolic Blood Pressure: 283 mmHg     Is BP treated: Yes     HDL Cholesterol: 30 mg/dL     Total Cholesterol: 196 mg/dL   Assessment and Plan: 67 y.o. male with  Hypertension: Not well controlled.  Increase lisinopril and recheck in 1 month.  Discuss strategies to help remember to take medicines.  Hyperlipidemia with increased CVD risk: Discussed that his  risk of not being on statins is much higher than his risk of statins.  Plan to try atorvastatin recheck in a month.  Excoriations: Improving watchful waiting try to avoid picking.  Patient is not ready to quit smoking.   No orders of the defined types were placed in this encounter.  Meds ordered this encounter  Medications  . lisinopril (PRINIVIL,ZESTRIL) 10 MG tablet    Sig: Take 1 tablet (10 mg total) by mouth daily.    Dispense:  90 tablet    Refill:  1     Historical information moved to improve visibility of documentation.  Past Medical History:  Diagnosis Date  . Essential hypertension, benign 06/29/2011  . Hemorrhoids 08/01/2018  . Morbid obesity ([place]) 10/12/2018  . 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 Prediabetes 05/16/2012  . Testosterone deficiency 10/12/2018  . Tubular adenoma of colon 10/17/2018   Colonoscopy 2016 at [facility].  Plan for 5-year recheck.  . Vitamin D deficiency 08/01/2017   Past Surgical History:  Procedure Laterality Date  . KIDNEY STONE SURGERY    . SHOULDER SURGERY  December 2010   Left  . UMBILICAL HERNIA REPAIR     Social History   Tobacco Use  . Smoking status: Current Every Day Smoker    Packs/day: 2.00    Types: Cigarettes  . Smokeless tobacco: Never Used  Substance Use Topics  . Alcohol use: No   family history includes Colon cancer in his brother; Hypertension in his mother; Parkinson's disease in his mother; Stroke in his brother.  Medications: Current Outpatient Medications  Medication Sig Dispense Refill  . atorvastatin (LIPITOR) 40 MG tablet Take 1 tablet (40 mg total) by mouth daily. 90 tablet 3  . lisinopril (PRINIVIL,ZESTRIL) 10 MG tablet Take 1 tablet (10 mg total) by mouth daily. 90 tablet 1  . triamcinolone ointment (KENALOG) 0.1 % Apply 1 application topically 2 (two) times daily. To affected areas 60 g 6  . Vitamin D, Cholecalciferol, 1000 units TABS Take 4,000 Units by mouth daily.     No current  facility-administered medications for this visit.    Allergies  Allergen Reactions  . Lisinopril Other (See Comments)    Muscle weakness  . Hydrochlorothiazide Other (See Comments)    Fatigue     Discussed warning signs or symptoms. Please see discharge instructions. Patient expresses understanding.

## 2018-11-29 ENCOUNTER — Encounter: Payer: Self-pay | Admitting: Family Medicine

## 2018-12-13 ENCOUNTER — Ambulatory Visit (INDEPENDENT_AMBULATORY_CARE_PROVIDER_SITE_OTHER): Payer: Medicare HMO | Admitting: Family Medicine

## 2018-12-13 VITALS — BP 150/64 | HR 91 | Ht 64.0 in | Wt 218.0 lb

## 2018-12-13 DIAGNOSIS — I10 Essential (primary) hypertension: Secondary | ICD-10-CM | POA: Diagnosis not present

## 2018-12-13 DIAGNOSIS — Z23 Encounter for immunization: Secondary | ICD-10-CM | POA: Diagnosis not present

## 2018-12-13 DIAGNOSIS — L853 Xerosis cutis: Secondary | ICD-10-CM

## 2018-12-13 DIAGNOSIS — Z720 Tobacco use: Secondary | ICD-10-CM | POA: Diagnosis not present

## 2018-12-13 DIAGNOSIS — L981 Factitial dermatitis: Secondary | ICD-10-CM

## 2018-12-13 DIAGNOSIS — E782 Mixed hyperlipidemia: Secondary | ICD-10-CM | POA: Diagnosis not present

## 2018-12-13 MED ORDER — TRIAMCINOLONE 0.1 % CREAM:EUCERIN CREAM 1:1: 1.0000 "application " | TOPICAL_CREAM | Freq: Two times a day (BID) | CUTANEOUS | 12 refills | Status: DC

## 2018-12-13 NOTE — Patient Instructions (Addendum)
Thank you for coming in today. Increase the lisinopril to 10mg  daily.  This will help the blood pressure.  Start atorvastatin for cholesterol and to reduce the risk of heart attack and stroke.   Use the cream on the rash on the legs especially after the shower.   Compression stockings.   Recheck in 1 month.

## 2018-12-13 NOTE — Progress Notes (Signed)
Erik Estrada is a 68 y.o. male who presents to [name] today for hypertension, hyperlipidemia, skin wounds.  Patient was originally seen in November and then again in December.  Hypertension: [name] has been taking 5 mg of lisinopril.  About a month ago he was prescribed 10 mg but never filled it because he was worried about side effects.  He is still taking 5 mg and denies chest pain palpitations lightheadedness dizziness or cough.  Additionally about a month ago he was prescribed atorvastatin.  He has not filled it because he is worried about side effects.  Additionally he has multiple skin excoriations thought to be neurotic in nature.  He also was treating these wounds with hydrogen peroxide.  In the interval he has continued taking an occasional hydrogen peroxide.  Overall he is improved but notes continued symptoms.  Additionally patient notes small itchy dry skin area on his lower extremities bilaterally.  This is been going on for a few weeks and is worse after he gets out of a hot shower.  He is not tried any treatment yet.  ROS as above:  Exam:  BP (!) 150/64   Pulse 91   Ht 5\' 4"  (1.626 m)   Wt 218 lb (98.9 kg)   BMI 37.42 kg/m  Wt Readings from Last 5 Encounters:  12/13/18 218 lb (98.9 kg)  11/12/18 217 lb (98.4 kg)  10/12/18 219 lb (99.3 kg)  09/06/11 211 lb (95.7 kg)  08/04/11 217 lb (98.4 kg)    Gen: Well NAD HEENT: EOMI,  MMM Lungs: Normal work of breathing. CTABL Heart: RRR no MRG Abd: NABS, Soft. Nondistended, Nontender Exts: Brisk capillary refill, warm and well perfused.  Skin: Small excoriated lesion left dorsal wrist.  Otherwise old scars but no new lesions Small macular erythematous lesions lower extremity bilaterally.    Assessment and Plan: 68 y.o. male with  Hypertension blood pressure not controlled.  Discussion with  patient regarding importance of medication.  Increase lisinopril to 10 mg and recheck in 1 month.  Lipids and CVD risk: Lengthy discussion regarding likely side effects of this medication and what to do if he does have side effects.  Additionally discussed risk versus benefits.  He agrees to start taking atorvastatin and will recheck in 1 month.  Skin wounds and skin excoriation: Discussion.  Commend against picking.  Skin lower extremities: Likely dry skin versus possible early venous stasis dermatitis.    Obesity: Work on diet and exercise.   PDMP not reviewed this encounter. No orders of the defined types were placed in this encounter.  No orders of the defined types were placed in this encounter.    Historical information moved to improve visibility of documentation.  Past Medical History:  Diagnosis Date  . Essential hypertension, benign 06/29/2011  . Hemorrhoids 08/01/2018  . Morbid obesity ([place]) 10/12/2018  . 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 Prediabetes 05/16/2012  . Testosterone deficiency 10/12/2018  . Tubular adenoma of colon 10/17/2018   Colonoscopy 2016 at [facility].  Plan for 5-year recheck.  . Vitamin D deficiency 08/01/2017   Past Surgical History:  Procedure Laterality Date  . KIDNEY STONE SURGERY    . SHOULDER SURGERY  December 2010   Left  . UMBILICAL HERNIA REPAIR     Social History   Tobacco Use  . Smoking status: Current Every Day Smoker    Packs/day: 2.00    Types: Cigarettes  .  Smokeless tobacco: Never Used  Substance Use Topics  . Alcohol use: No   family history includes Colon cancer in his brother; Hypertension in his mother; Parkinson's disease in his mother; Stroke in his brother.  Medications: Current Outpatient Medications  Medication Sig Dispense Refill  . atorvastatin (LIPITOR) 40 MG tablet Take 1 tablet (40 mg total) by mouth daily. 90 tablet 3  . lisinopril (PRINIVIL,ZESTRIL) 10 MG tablet Take 1 tablet (10 mg total) by  mouth daily. 90 tablet 1  . triamcinolone ointment (KENALOG) 0.1 % Apply 1 application topically 2 (two) times daily. To affected areas 60 g 6  . Vitamin D, Cholecalciferol, 1000 units TABS Take 4,000 Units by mouth daily.     No current facility-administered medications for this visit.    Allergies  Allergen Reactions  . Lisinopril Other (See Comments)    Muscle weakness  . Hydrochlorothiazide Other (See Comments)    Fatigue     Discussed warning signs or symptoms. Please see discharge instructions. Patient expresses understanding.

## 2018-12-13 NOTE — Addendum Note (Signed)
Addended by: Doree Albee on: 12/13/2018 02:28 PM   Modules accepted: Orders

## 2018-12-20 ENCOUNTER — Encounter: Payer: Self-pay | Admitting: Family Medicine

## 2019-01-14 ENCOUNTER — Encounter: Payer: Self-pay | Admitting: Family Medicine

## 2019-01-14 ENCOUNTER — Ambulatory Visit (INDEPENDENT_AMBULATORY_CARE_PROVIDER_SITE_OTHER): Payer: Medicare HMO | Admitting: Family Medicine

## 2019-01-14 VITALS — BP 149/72 | HR 85 | Wt 224.0 lb

## 2019-01-14 DIAGNOSIS — R5383 Other fatigue: Secondary | ICD-10-CM | POA: Diagnosis not present

## 2019-01-14 DIAGNOSIS — M791 Myalgia, unspecified site: Secondary | ICD-10-CM

## 2019-01-14 DIAGNOSIS — L853 Xerosis cutis: Secondary | ICD-10-CM | POA: Diagnosis not present

## 2019-01-14 DIAGNOSIS — I10 Essential (primary) hypertension: Secondary | ICD-10-CM

## 2019-01-14 DIAGNOSIS — T466X5A Adverse effect of antihyperlipidemic and antiarteriosclerotic drugs, initial encounter: Secondary | ICD-10-CM

## 2019-01-14 MED ORDER — LISINOPRIL 20 MG PO TABS: 20.0000 mg | ORAL_TABLET | Freq: Every day | ORAL | 1 refills | Status: DC

## 2019-01-14 MED ORDER — TRIAMCINOLONE ACETONIDE 0.1 % EX CREA: 1.0000 "application " | TOPICAL_CREAM | Freq: Two times a day (BID) | CUTANEOUS | 12 refills | Status: DC

## 2019-01-14 NOTE — Patient Instructions (Addendum)
Thank you for coming in today. Work on the side leg raises.  Do about 30 reps 2x daily.  Do the stretch.  STOP lispinopril 10.  Start lisinorpil 20.  Use the cream in the tub on the ankles as needed  Use the ointment on the wounds.  Recheck in 1 month.   Return sooner if needed.   TENS UNIT: This is helpful for muscle pain and spasm.   Search and Purchase a TENS 7000 2nd edition at  www.tenspros.com or www.[employer].com It should be less than $30.     TENS unit instructions: Do not shower or bathe with the unit on Turn the unit off before removing electrodes or batteries If the electrodes lose stickiness add a drop of water to the electrodes after they are disconnected from the unit and place on plastic sheet. If you continued to have difficulty, call the TENS unit company to purchase more electrodes. Do not apply lotion on the skin area prior to use. Make sure the skin is clean and dry as this will help prolong the life of the electrodes. After use, always check skin for unusual red areas, rash or other skin difficulties. If there are any skin problems, does not apply electrodes to the same area. Never remove the electrodes from the unit by pulling the wires. Do not use the TENS unit or electrodes other than as directed. Do not change electrode placement without consultating your therapist or physician. Keep 2 fingers with between each electrode. Wear time ratio is 2:1, on to off times.    For example on for 30 minutes off for 15 minutes and then on for 30 minutes off for 15 minutes     Trochanteric Bursitis Trochanteric bursitis is a condition that causes hip pain. Trochanteric bursitis happens when fluid-filled sacs (bursae) in the hip get irritated. Normally these sacs absorb shock and help strong bands of tissue (tendons) in your hip glide smoothly over each other and over your hip bones. What are the causes? This condition results from increased friction between the hip  bones and the tendons that go over them. This condition can happen if you:  Have weak hips.  Use your hip muscles too much (overuse).  Get hit in the hip. What increases the risk? This condition is more likely to develop in:  Women.  Adults who are middle-aged or older.  People with arthritis or a spinal condition.  People with weak buttocks muscles (gluteal muscles).  People who have one leg that is shorter than the other.  People who participate in certain kinds of athletic activities, such as: ? Running sports, especially long-distance running. ? Contact sports, like football or martial arts. ? Sports in which falls may occur, like skiing. What are the signs or symptoms? The main symptom of this condition is pain and tenderness over the point of your hip. The pain may be:  Sharp and intense.  Dull and achy.  Felt on the outside of your thigh. It may increase when you:  Lie on your side.  Walk or run.  Go up on stairs.  Sit.  Stand up after sitting.  Stand for long periods of time. How is this diagnosed? This condition may be diagnosed based on:  Your symptoms.  Your medical history.  A physical exam.  Imaging tests, such as: ? X-rays to check your bones. ? An MRI or ultrasound to check your tendons and muscles. During your physical exam, your health care provider will check  the movement and strength of your hip. He or she may press on the point of your hip to check for pain. How is this treated? This condition may be treated by:  Resting.  Reducing your activity.  Avoiding activities that cause pain.  Using crutches, a cane, or a walker to decrease the strain on your hip.  Taking medicine to help with swelling.  Having medicine injected into the bursae to help with swelling.  Using ice, heat, and massage therapy for pain relief.  Physical therapy exercises for strength and flexibility.  Surgery (rare). Follow these instructions at  home: Activity  Rest.  Avoid activities that cause pain.  Return to your normal activities as told by your health care provider. Ask your health care provider what activities are safe for you. Managing pain, stiffness, and swelling  Take over-the-counter and prescription medicines only as told by your health care provider.  If directed, apply heat to the injured area as told by your health care provider. ? Place a towel between your skin and the heat source. ? Leave the heat on for 20-30 minutes. ? Remove the heat if your skin turns bright red. This is especially important if you are unable to feel pain, heat, or cold. You may have a greater risk of getting burned.  If directed, apply ice to the injured area: ? Put ice in a plastic bag. ? Place a towel between your skin and the bag. ? Leave the ice on for 20 minutes, 2-3 times a day. General instructions  If the affected leg is one that you use for driving, ask your health care provider when it is safe to drive.  Use crutches, a cane, or a walker as told by your health care provider.  If one of your legs is shorter than the other, get fitted for a shoe insert.  Lose weight if you are overweight. How is this prevented?  Wear supportive footwear that is appropriate for your sport.  If you have hip pain, start any new exercise or sport slowly.  Maintain physical fitness, including: ? Strength. ? Flexibility. Contact a health care provider if:  Your pain does not improve with 2-4 weeks. Get help right away if:  You develop severe pain.  You have a fever.  You develop increased redness over your hip.  You have a change in your bowel function or bladder function.  You cannot control the muscles in your feet. This information is not intended to replace advice given to you by your health care provider. Make sure you discuss any questions you have with your health care provider. Document Released: 01/05/2005 Document  Revised: 08/03/2016 Document Reviewed: 11/13/2015 Elsevier Interactive Patient Education  2019 [employer].

## 2019-01-14 NOTE — Progress Notes (Signed)
Erik Estrada is a 68 y.o. male who presents to [facility]: [facility] today for hypertension, hyperlipidemia, itchy rash on legs.  Hypertension: Erik Estrada takes lisinopril 10 mg daily.  He denies chest pain palpitations shortness of breath lightheadedness or dizziness.  He tolerates his medication well.  He does not check his blood pressure regularly.  Hyperlipidemia: Erik Estrada was tried on atorvastatin.  He notes significant muscle aches and pains in this medication.  The muscle aches and pains resolved when he discontinued the medication.  Rash on legs: Erik Estrada notes an itchy rash on his ankles bilaterally.  He notes it occurs with some swelling.  He has not tried much treatment yet.  Additionally Erik Estrada notes significant fatigue.  He feels tired and wakes up in the morning.  He notes that he does snore.  He is not sure if he stops breathing at night.  He is never had evaluation for sleep apnea. ROS as above:  Exam:  BP (!) 149/72   Pulse 85   Wt 224 lb (101.6 kg)   BMI 38.45 kg/m  Wt Readings from Last 5 Encounters:  01/14/19 224 lb (101.6 kg)  12/13/18 218 lb (98.9 kg)  11/12/18 217 lb (98.4 kg)  10/12/18 219 lb (99.3 kg)  09/06/11 211 lb (95.7 kg)    Gen: Well NAD HEENT: EOMI,  MMM Lungs: Normal work of breathing. CTABL Heart: RRR no MRG Abd: NABS, Soft. Nondistended, Nontender Exts: Brisk capillary refill, warm and well perfused.  Trace edema bilateral lower extremities Skin: Scaly lacy mild erythema ankles bilaterally.  No palpable lesions.  Nontender.  Lab and Radiology Results No results found for this or any previous visit (from the past 72 hour(s)). No results found.    Assessment and Plan: 68 y.o. male with  Hypertension: Blood pressure elevated today.  Increase lisinopril to 20 mg and recheck in 1 month.  Hyperlipidemia intolerant of atorvastatin.  Will  reassess in 1 month.  Likely try pravastatin.  Itchy skin ankles: Likely dry skin.  Trial of triamcinolone cream.  Fatigue: Likely sleep apnea.  TSH and testosterone and anemia checked fall 2019.  Plan for home sleep study.   Orders Placed This Encounter  Procedures  . Home sleep test    Standing Status:   Future    Standing Expiration Date:   01/15/2020    Scheduling Instructions:     Local if able.    Order Specific Question:   Where should this test be performed:    Answer:   Other   Meds ordered this encounter  Medications  . triamcinolone cream (KENALOG) 0.1 %    Sig: Apply 1 application topically 2 (two) times daily. On itchy dry skin    Dispense:  453.6 g    Refill:  12  . lisinopril (PRINIVIL,ZESTRIL) 20 MG tablet    Sig: Take 1 tablet (20 mg total) by mouth daily.    Dispense:  90 tablet    Refill:  1    Replaces Lisinopril 10     Historical information moved to improve visibility of documentation.  Past Medical History:  Diagnosis Date  . Essential hypertension, benign 06/29/2011  . Hemorrhoids 08/01/2018  . Morbid obesity ([place]) 10/12/2018  . 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 Prediabetes 05/16/2012  . Testosterone deficiency 10/12/2018  . Tubular adenoma of colon 10/17/2018   Colonoscopy 2016 at [facility].  Plan for 5-year recheck.  . Vitamin D deficiency 08/01/2017  Past Surgical History:  Procedure Laterality Date  . KIDNEY STONE SURGERY    . SHOULDER SURGERY  December 2010   Left  . UMBILICAL HERNIA REPAIR     Social History   Tobacco Use  . Smoking status: Current Every Day Smoker    Packs/day: 2.00    Types: Cigarettes  . Smokeless tobacco: Never Used  Substance Use Topics  . Alcohol use: No   family history includes Colon cancer in his brother; Hypertension in his mother; Parkinson's disease in his mother; Stroke in his brother.  Medications: Current Outpatient Medications  Medication Sig Dispense Refill  . triamcinolone  ointment (KENALOG) 0.1 % Apply 1 application topically 2 (two) times daily. To affected areas 60 g 6  . Vitamin D, Cholecalciferol, 1000 units TABS Take 4,000 Units by mouth daily.    Marland Kitchen lisinopril (PRINIVIL,ZESTRIL) 20 MG tablet Take 1 tablet (20 mg total) by mouth daily. 90 tablet 1  . triamcinolone cream (KENALOG) 0.1 % Apply 1 application topically 2 (two) times daily. On itchy dry skin 453.6 g 12   No current facility-administered medications for this visit.    Allergies  Allergen Reactions  . Hydrochlorothiazide Other (See Comments)    Fatigue  . Lipitor [Atorvastatin Calcium] Other (See Comments)    Myalgia     Discussed warning signs or symptoms. Please see discharge instructions. Patient expresses understanding.

## 2019-02-12 ENCOUNTER — Other Ambulatory Visit: Payer: Self-pay

## 2019-02-12 ENCOUNTER — Ambulatory Visit (INDEPENDENT_AMBULATORY_CARE_PROVIDER_SITE_OTHER): Payer: Medicare HMO | Admitting: Family Medicine

## 2019-02-12 VITALS — BP 130/70 | HR 85 | Wt 220.0 lb

## 2019-02-12 DIAGNOSIS — I10 Essential (primary) hypertension: Secondary | ICD-10-CM

## 2019-02-12 DIAGNOSIS — L981 Factitial dermatitis: Secondary | ICD-10-CM

## 2019-02-12 DIAGNOSIS — E782 Mixed hyperlipidemia: Secondary | ICD-10-CM

## 2019-02-12 MED ORDER — PRAVASTATIN SODIUM 20 MG PO TABS: 20.0000 mg | ORAL_TABLET | Freq: Every day | ORAL | 2 refills | Status: DC

## 2019-02-12 NOTE — Telephone Encounter (Signed)
Patient requested Pravastatin sent to [facility] order. Circe Chilton,CMA

## 2019-02-12 NOTE — Patient Instructions (Addendum)
Thank you for coming in today. Try taking pravastatin for cholesterol.  Let me know if you have muscle pain.  Recheck in 3 months.  Return sooner if needed.   Let me know if you have any issues.   Pravastatin tablets What is this medicine? PRAVASTATIN (PRA va stat in) is known as a HMG-CoA reductase inhibitor or 'statin'. It lowers the level of cholesterol and triglycerides in the blood. This drug may also reduce the risk of heart attack, stroke, or other health problems in patients with risk factors for heart disease. Diet and lifestyle changes are often used with this drug. This medicine may be used for other purposes; ask your health care provider or pharmacist if you have questions. COMMON BRAND NAME(S): Pravachol What should I tell my health care provider before I take this medicine? They need to know if you have any of these conditions: -diabetes -if you often drink alcohol -history of stroke -kidney disease -liver disease -muscle aches or weakness -thyroid disease -an unusual or allergic reaction to pravastatin, other medicines, foods, dyes, or preservatives -pregnant or trying to get pregnant -breast-feeding How should I use this medicine? Take pravastatin tablets by mouth. Swallow the tablets with a drink of water. Pravastatin can be taken at anytime of the day, with or without food. Follow the directions on the prescription label. Take your doses at regular intervals. Do not take your medicine more often than directed. Talk to your pediatrician regarding the use of this medicine in children. Special care may be needed. Pravastatin has been used in children as young as 53 years of age. Overdosage: If you think you have taken too much of this medicine contact a poison control center or emergency room at once. NOTE: This medicine is only for you. Do not share this medicine with others. What if I miss a dose? If you miss a dose, take it as soon as you can. If it is almost time for  your next dose, take only that dose. Do not take double or extra doses. What may interact with this medicine? This medicine may interact with the following medications: -colchicine -cyclosporine -other medicines for high cholesterol -some antibiotics like azithromycin, clarithromycin, erythromycin, and telithromycin This list may not describe all possible interactions. Give your health care provider a list of all the medicines, herbs, non-prescription drugs, or dietary supplements you use. Also tell them if you smoke, drink alcohol, or use illegal drugs. Some items may interact with your medicine. What should I watch for while using this medicine? Visit your doctor or health care professional for regular check-ups. You may need regular tests to make sure your liver is working properly. Your health care professional may tell you to stop taking this medicine if you develop muscle problems. If your muscle problems do not go away after stopping this medicine, contact your health care professional. Do not become pregnant while taking this medicine. Women should inform their health care professional if they wish to become pregnant or think they might be pregnant. There is a potential for serious side effects to an unborn child. Talk to your health care professional or pharmacist for more information. Do not breast-feed an infant while taking this medicine. This medicine may affect blood sugar levels. If you have diabetes, check with your doctor or health care professional before you change your diet or the dose of your diabetic medicine. If you are going to need surgery or other procedure, tell your doctor that you are using this  medicine. This drug is only part of a total heart-health program. Your doctor or a dietician can suggest a low-cholesterol and low-fat diet to help. Avoid alcohol and smoking, and keep a proper exercise schedule. This medicine may cause a decrease in Co-Enzyme Q-10. You should make  sure that you get enough Co-Enzyme Q-10 while you are taking this medicine. Discuss the foods you eat and the vitamins you take with your health care professional. What side effects may I notice from receiving this medicine? Side effects that you should report to your doctor or health care professional as soon as possible: -allergic reactions like skin rash, itching or hives, swelling of the face, lips, or tongue -dark urine -fever -muscle pain, cramps, or weakness -redness, blistering, peeling or loosening of the skin, including inside the mouth -trouble passing urine or change in the amount of urine -unusually weak or tired -yellowing of the eyes or skin Side effects that usually do not require medical attention (report to your doctor or health care professional if they continue or are bothersome): -gas -headache -heartburn -indigestion -stomach pain This list may not describe all possible side effects. Call your doctor for medical advice about side effects. You may report side effects to FDA at 1-800-FDA-1088. Where should I keep my medicine? Keep out of the reach of children. Store at room temperature between 15 to 30 degrees C (59 to 86 degrees F). Protect from light. Keep container tightly closed. Throw away any unused medicine after the expiration date. NOTE: This sheet is a summary. It may not cover all possible information. If you have questions about this medicine, talk to your doctor, pharmacist, or health care provider.  2019 Elsevier/Gold Standard (2017-08-01 12:37:09)

## 2019-02-12 NOTE — Progress Notes (Signed)
Erik Estrada is a 68 y.o. male who presents to [facility]: [place] today for follow-up hypertension, hyperlipidemia, skin lesions, and fatigue.  Hypertension: Patient has been slowly titrated on his blood pressure medication and currently taking lisinopril 20 mg daily.  He notes his blood pressures typically well controlled at home in the 120s.  No chest pain palpitations shortness of breath.  Hyperlipidemia: Patient had significant intolerance to atorvastatin with muscle aches or pains.  He has been off of the medication now for over a month.  In the interim he notes that feels fine but is not restarted any anti-cholesterol medication treatments.  Fatigue: Initially quite concerned for sleep apnea.  Home sleep study was ordered.  He decided not to get it.  Rash: Patient has frequent rashes in areas of skin irritation.  Some of this is thought to be due to skin picking.  He has been prescribed triamcinolone cream in the past.  Additionally he had some dry skin last month and was given triamcinolone cream as well.  In the interim he notes that he had improvement.    ROS as above:  Exam:  BP 130/70   Pulse 85   Wt 220 lb (99.8 kg)   BMI 37.76 kg/m  Wt Readings from Last 5 Encounters:  02/12/19 220 lb (99.8 kg)  01/14/19 224 lb (101.6 kg)  12/13/18 218 lb (98.9 kg)  11/12/18 217 lb (98.4 kg)  10/12/18 219 lb (99.3 kg)    Gen: Well NAD HEENT: EOMI,  MMM Lungs: Normal work of breathing. CTABL Heart: RRR no MRG Abd: NABS, Soft. Nondistended, Nontender Exts: Brisk capillary refill, warm and well perfused.  Skin: Multiple scars and areas of excoriation on face and forearms.  Lab and Radiology Results No results found for this or any previous visit (from the past 72 hour(s)). No results found.    Assessment and Plan: 68 y.o. male with  Hypertension: Blood pressure  better controlled today.  Continue current regimen and recheck in about 3 months.  Hyperlipidemia: Trial of low-dose pravastatin.  If intolerant will discontinue.  Recheck 3 months.  Rash: Patient has neurotic excoriation with multiple areas of skin excoriation.  Use triamcinolone for comfort.  Discourage digging and cutting.  Reassess need for sleep study in the future.  PDMP not reviewed this encounter. No orders of the defined types were placed in this encounter.  Meds ordered this encounter  Medications  . pravastatin (PRAVACHOL) 20 MG tablet    Sig: Take 1 tablet (20 mg total) by mouth daily.    Dispense:  30 tablet    Refill:  2     Historical information moved to improve visibility of documentation.  Past Medical History:  Diagnosis Date  . Essential hypertension, benign 06/29/2011  . Hemorrhoids 08/01/2018  . Morbid obesity ([place]) 10/12/2018  . 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 Prediabetes 05/16/2012  . Testosterone deficiency 10/12/2018  . Tubular adenoma of colon 10/17/2018   Colonoscopy [facility].  Plan for 5-year recheck.  . Vitamin D deficiency 08/01/2017   Past Surgical History:  Procedure Laterality Date  . KIDNEY STONE SURGERY    . SHOULDER SURGERY  December 2010   Left  . UMBILICAL HERNIA REPAIR     Social History   Tobacco Use  . Smoking status: Current Every Day Smoker    Packs/day: 2.00    Types: Cigarettes  . Smokeless tobacco: Never Used  Substance  Use Topics  . Alcohol use: No   family history includes Colon cancer in his brother; Hypertension in his mother; Parkinson's disease in his mother; Stroke in his brother.  Medications: Current Outpatient Medications  Medication Sig Dispense Refill  . lisinopril (PRINIVIL,ZESTRIL) 20 MG tablet Take 1 tablet (20 mg total) by mouth daily. 90 tablet 1  . triamcinolone cream (KENALOG) 0.1 % Apply 1 application topically 2 (two) times daily. On itchy dry skin 453.6 g 12  .  triamcinolone ointment (KENALOG) 0.1 % Apply 1 application topically 2 (two) times daily. To affected areas 60 g 6  . Vitamin D, Cholecalciferol, 1000 units TABS Take 4,000 Units by mouth daily.    . pravastatin (PRAVACHOL) 20 MG tablet Take 1 tablet (20 mg total) by mouth daily. 30 tablet 2   No current facility-administered medications for this visit.    Allergies  Allergen Reactions  . Hydrochlorothiazide Other (See Comments)    Fatigue  . Lipitor [Atorvastatin Calcium] Other (See Comments)    Myalgia     Discussed warning signs or symptoms. Please see discharge instructions. Patient expresses understanding.

## 2019-04-24 ENCOUNTER — Other Ambulatory Visit: Payer: Self-pay | Admitting: Family Medicine

## 2019-04-24 MED ORDER — PRAVASTATIN SODIUM 20 MG PO TABS: 20.0000 mg | ORAL_TABLET | Freq: Every day | ORAL | 2 refills | Status: DC

## 2019-04-24 NOTE — Telephone Encounter (Signed)
Received refill or new prescription request for pravastatin via fax.  I can tell in the fax which pharmacy requested at.  We will send a prescription to [facility] as that seems to be his preferred pharmacy however it is a bit unclear.

## 2019-04-24 NOTE — Telephone Encounter (Signed)
Subsequent fax was from [place].  We will send pravastatin prescription to [place].

## 2019-05-15 ENCOUNTER — Encounter: Payer: Self-pay | Admitting: Family Medicine

## 2019-05-15 ENCOUNTER — Ambulatory Visit (INDEPENDENT_AMBULATORY_CARE_PROVIDER_SITE_OTHER): Payer: Medicare HMO | Admitting: Family Medicine

## 2019-05-15 VITALS — BP 119/79 | HR 82 | Temp 98.2°F | Wt 226.0 lb

## 2019-05-15 DIAGNOSIS — E782 Mixed hyperlipidemia: Secondary | ICD-10-CM

## 2019-05-15 DIAGNOSIS — I10 Essential (primary) hypertension: Secondary | ICD-10-CM

## 2019-05-15 DIAGNOSIS — R7303 Prediabetes: Secondary | ICD-10-CM | POA: Diagnosis not present

## 2019-05-15 DIAGNOSIS — W57XXXA Bitten or stung by nonvenomous insect and other nonvenomous arthropods, initial encounter: Secondary | ICD-10-CM

## 2019-05-15 DIAGNOSIS — Z20828 Contact with and (suspected) exposure to other viral communicable diseases: Secondary | ICD-10-CM | POA: Diagnosis not present

## 2019-05-15 DIAGNOSIS — Z20822 Contact with and (suspected) exposure to covid-19: Secondary | ICD-10-CM

## 2019-05-15 DIAGNOSIS — Z720 Tobacco use: Secondary | ICD-10-CM

## 2019-05-15 LAB — SAR COV2 SEROLOGY (COVID19)AB(IGG),IA: SARS CoV2 AB IGG: NEGATIVE

## 2019-05-15 MED ORDER — NICOTINE POLACRILEX 4 MG MT GUM: 4.0000 mg | CHEWING_GUM | OROMUCOSAL | 1 refills | Status: DC | PRN

## 2019-05-15 NOTE — Patient Instructions (Addendum)
Thank you for coming in today. OK to apply the traimcinolone cream to the rash.  Keep me updated.   Keep me updated.  Recheck in 6 months or sooner if needed.   I would recommend starting pravastatin for cholesterol.   Use nicotine gum.    Insect Bite, Adult An insect bite can make your skin red, itchy, and swollen. An insect bite is different from an insect sting, which happens when an insect injects poison (venom) into the skin. Some insects can spread disease to people through a bite. However, most insect bites do not lead to disease and are not serious. What are the causes? Insects may bite for a variety of reasons, including:  Hunger.  To defend themselves. Insects that bite include:  Spiders.  Mosquitoes.  Ticks.  Fleas.  Ants.  Flies.  Kissing bugs.  Chiggers. What are the signs or symptoms? Symptoms of this condition include:  Itching or pain in the bite area.  Redness and swelling in the bite area.  An open wound (skin ulcer). In many cases, symptoms last for 2-4 days. In rare cases, a person may have a severe allergic reaction (anaphylactic reaction) to a bite. Symptoms of an anaphylactic reaction may include:  Feeling warm in the face (flushed). This may include redness.  Itchy, red, swollen areas of skin (hives).  Swelling of the eyes, lips, face, mouth, tongue, or throat.  Difficulty breathing, speaking, or swallowing.  Noisy breathing (wheezing).  Dizziness or light-headedness.  Fainting.  Pain or cramping in the abdomen.  Vomiting.  Diarrhea. How is this diagnosed? This condition is usually diagnosed based on symptoms and a physical exam. How is this treated? Treatment is usually not needed. Symptoms often go away on their own. When treatment is recommended, it may involve:  Applying a cream or lotion to the bite area. This treatment helps with itching.  Taking an antibiotic medicine. This treatment is needed if the bite area  gets infected.  Getting a tetanus shot, if you are not up to date on this vaccine.  Applying ice to the affected area.  Allergy medicines called antihistamines. This treatment may be needed if you develop itching or an allergic reaction to the insect bite.  Giving yourself an epinephrine injection if you have an anaphylactic reaction to a bite. To give the injection, you will use what is commonly called an auto-injector "pen" (pre-filled automatic epinephrine injection device). Your health care provider will teach you how to use an auto-injector pen. Follow these instructions at home: Bite area care   Do not scratch the bite area.  Keep the bite area clean and dry. Wash it every day with soap and water as told by your health care provider.  Check the bite area every day for signs of infection. Check for: ? Redness, swelling, or pain. ? Fluid or blood. ? Warmth. ? Pus or a bad smell. Managing pain, itching, and swelling   You may apply cortisone cream, calamine lotion, or a paste made of baking soda and water to the bite area as told by your health care provider.  If directed, put ice on the bite area. ? Put ice in a plastic bag. ? Place a towel between your skin and the bag. ? Leave the ice on for 20 minutes, 2-3 times a day. General instructions  Apply or take over-the-counter and prescription medicines only as told by your health care provider.  If you were prescribed an antibiotic medicine, take or apply it  as told by your health care provider. Do not stop using the antibiotic even if your condition improves.  Keep all follow-up visits as told by your health care provider. This is important. How is this prevented? To help reduce your risk of insect bites:  When you are outdoors, wear clothing that covers your arms and legs. This is especially important in the early morning and evening.  Use insect repellent. The best insect repellents contain DEET, picaridin, oil of  lemon eucalyptus (OLE), or IR3535.  Consider spraying your clothing with a pesticide called permethrin. Permethrin helps prevent insect bites. It works for several weeks and for up to 5-6 clothing washes. Do not apply permethrin directly to the skin.  If your home windows do not have screens, consider installing them.  If you will be sleeping in an area where there are mosquitoes, consider covering your sleeping area with a mosquito net. Contact a health care provider if:  You have redness, swelling, or pain in the bite area.  You have fluid or blood coming from the bite area.  The bite area feels warm to the touch.  You have pus or a bad smell coming from the bite area.  You have a fever. Get help right away if:  You have joint pain.  You have a rash.  You feel unusually tired or sleepy.  You have neck pain.  You have a headache.  You have unusual weakness.  You develop symptoms of an anaphylactic reaction. These may include: ? Flushed skin. ? Hives. ? Swelling of the eyes, lips, face, mouth, tongue, or throat. ? Difficulty breathing, speaking, or swallowing. ? Wheezing. ? Dizziness or light-headedness. ? Fainting. ? Pain or cramping in the abdomen. ? Vomiting. ? Diarrhea. These symptoms may represent a serious problem that is an emergency. Do not wait to see if the symptoms will go away. Do the following right away:  Use the auto-injector pen as you have been instructed.  Get medical help. Call your local emergency services (911 in the [place] Do not drive yourself to the hospital. Summary  An insect bite can make your skin red, itchy, and swollen.  Treatment is usually not needed. Symptoms often go away on their own. When treatment is recommended, it may involve taking medicine, applying medicine to the area, or applying ice.  Apply or take over-the-counter and prescription medicines only as told by your health care provider.  Use insect repellent to help  prevent insect bites.  Contact a health care provider if you have any signs of infection in the bite area. This information is not intended to replace advice given to you by your health care provider. Make sure you discuss any questions you have with your health care provider. Document Released: 01/05/2005 Document Revised: 06/08/2018 Document Reviewed: 06/08/2018 Elsevier Interactive Patient Education  2019 [employer].

## 2019-05-15 NOTE — Progress Notes (Signed)
Erik Estrada is a 68 y.o. male who presents to [facility]: [place] today for follow-up hypertension hyperlipidemia and smoking as well as discuss new rash.  Hypertension: Taking lisinopril.  Patient tolerates it well with no chest pain palpitations shortness of breath lightheadedness or dizziness.  Hyperlipidemia: Patient intolerant to atorvastatin causing muscle pains and aches.  He refuses to consider trying pravastatin.  He is happy with how things are.  He currently smoking Marlboro Lights 100s about 1 to 2 packs/day.  He is interested in cutting back or quitting.  He would like to try nicotine replacement gum.  Additionally he notes a itchy rash on his right leg.  He was doing some weed whacking and thinks he was stung by a an ant or some other bug.  He notes he applied some rubbing alcohol which helped a little.  He notes is not worsening.  He denies pain or fever.  Patient denies any tick bite.  Additionally [name] would like a COVID antibody test.  He thinks he had COVID-19 earlier this year and would like confirmatory testing if possible.  He is currently asymptomatic. ROS as above:  Exam:  BP 119/79   Pulse 82   Temp 98.2 F (36.8 C) (Oral)   Wt 226 lb (102.5 kg)   BMI 38.79 kg/m  Wt Readings from Last 5 Encounters:  05/15/19 226 lb (102.5 kg)  02/12/19 220 lb (99.8 kg)  01/14/19 224 lb (101.6 kg)  12/13/18 218 lb (98.9 kg)  11/12/18 217 lb (98.4 kg)    Gen: Well NAD HEENT: EOMI,  MMM Lungs: Normal work of breathing. CTABL Heart: RRR no MRG Abd: NABS, Soft. Nondistended, Nontender Exts: Brisk capillary refill, warm and well perfused.  Skin: Small erythematous circular blanchable nontender macular lesion right anterior knee.  Multiple excoriated wounds present on right forearm and face as previously described.        Assessment and Plan: 69 y.o.  male with  Hypertension: Blood pressure controlled continue current regimen.  Labs recently were normal.  Hyperlipidemia: Watchful waiting at this point as patient is refusing to try other medications.  Rash on leg: Likely arthropod bite.  Plan for trial of triamcinolone cream.  Smoking: Agreed to start smoking cessation.  Will use nicotine replacement therapy.  PDMP not reviewed this encounter. Orders Placed This Encounter  Procedures  . SAR CoV2 Serology (COVID 19)AB(IGG)IA  . POCT HgB A1C   Meds ordered this encounter  Medications  . nicotine polacrilex ([employer] NICOTINE) 4 MG gum    Sig: Take 1 each (4 mg total) by mouth as needed for smoking cessation.    Dispense:  100 tablet    Refill:  1    Ok to sub     Historical information moved to improve visibility of documentation.  Past Medical History:  Diagnosis Date  . Essential hypertension, benign 06/29/2011  . Hemorrhoids 08/01/2018  . Morbid obesity ([place]) 10/12/2018  . 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 Prediabetes 05/16/2012  . Testosterone deficiency 10/12/2018  . Tubular adenoma of colon 10/17/2018   Colonoscopy 2016 at [facility].  Plan for 5-year recheck.  . Vitamin D deficiency 08/01/2017   Past Surgical History:  Procedure Laterality Date  . KIDNEY STONE SURGERY    . SHOULDER SURGERY  December 2010   Left  . UMBILICAL HERNIA REPAIR     Social History   Tobacco Use  . Smoking status: Current Every Day  Smoker    Packs/day: 2.00    Types: Cigarettes  . Smokeless tobacco: Never Used  Substance Use Topics  . Alcohol use: No   family history includes Colon cancer in his brother; Hypertension in his mother; Parkinson's disease in his mother; Stroke in his brother.  Medications: Current Outpatient Medications  Medication Sig Dispense Refill  . lisinopril (PRINIVIL,ZESTRIL) 20 MG tablet Take 1 tablet (20 mg total) by mouth daily. 90 tablet 1  . triamcinolone cream (KENALOG) 0.1 % Apply 1  application topically 2 (two) times daily. On itchy dry skin 453.6 g 12  . triamcinolone ointment (KENALOG) 0.1 % Apply 1 application topically 2 (two) times daily. To affected areas 60 g 6  . Vitamin D, Cholecalciferol, 1000 units TABS Take 4,000 Units by mouth daily.    . nicotine polacrilex ([employer]S NICOTINE) 4 MG gum Take 1 each (4 mg total) by mouth as needed for smoking cessation. 100 tablet 1   No current facility-administered medications for this visit.    Allergies  Allergen Reactions  . Hydrochlorothiazide Other (See Comments)    Fatigue  . Lipitor [Atorvastatin Calcium] Other (See Comments)    Myalgia     Discussed warning signs or symptoms. Please see discharge instructions. Patient expresses understanding.

## 2019-05-22 ENCOUNTER — Telehealth: Payer: Self-pay | Admitting: Family Medicine

## 2019-05-22 DIAGNOSIS — Z720 Tobacco use: Secondary | ICD-10-CM

## 2019-05-22 MED ORDER — NICOTINE POLACRILEX 4 MG MT GUM: 4.0000 mg | CHEWING_GUM | OROMUCOSAL | 1 refills | Status: DC | PRN

## 2019-05-22 NOTE — Telephone Encounter (Signed)
Pt's wife called and stated pharmacy did not receive RX for nicotine gum. I resent rx and advised for pt to call if any problems.

## 2019-05-24 MED ORDER — NICOTINE POLACRILEX 4 MG MT GUM: 4.0000 mg | CHEWING_GUM | OROMUCOSAL | 1 refills | Status: DC | PRN

## 2019-05-24 NOTE — Telephone Encounter (Signed)
Pt wife left msg asking for RX to be sent to [facility].  RX sent, wife advised.

## 2019-05-24 NOTE — Addendum Note (Signed)
Addended by: Towana Badger on: 05/24/2019 08:40 AM   Modules accepted: Orders

## 2019-07-09 ENCOUNTER — Other Ambulatory Visit: Payer: Self-pay | Admitting: Family Medicine

## 2019-07-09 MED ORDER — LISINOPRIL 20 MG PO TABS: 20.0000 mg | ORAL_TABLET | Freq: Every day | ORAL | 1 refills | Status: DC

## 2019-08-11 ENCOUNTER — Other Ambulatory Visit: Payer: Self-pay | Admitting: Family Medicine

## 2019-11-03 ENCOUNTER — Other Ambulatory Visit: Payer: Self-pay | Admitting: Family Medicine

## 2020-02-09 ENCOUNTER — Other Ambulatory Visit: Payer: Self-pay | Admitting: Family Medicine

## 2020-02-13 ENCOUNTER — Telehealth (INDEPENDENT_AMBULATORY_CARE_PROVIDER_SITE_OTHER): Payer: Medicare HMO | Admitting: Family Medicine

## 2020-02-13 ENCOUNTER — Encounter: Payer: Self-pay | Admitting: Family Medicine

## 2020-02-13 DIAGNOSIS — I10 Essential (primary) hypertension: Secondary | ICD-10-CM | POA: Diagnosis not present

## 2020-02-13 MED ORDER — LISINOPRIL 20 MG PO TABS: 20.0000 mg | ORAL_TABLET | Freq: Every day | ORAL | 1 refills | Status: DC

## 2020-02-13 NOTE — Progress Notes (Signed)
Needs a follows up for his BP medication and wants 90 day supply.    Phone call was ended abruptly.

## 2020-02-13 NOTE — Assessment & Plan Note (Signed)
BP is elevated at home today.  Lisinopril refilled, he will f/u in clinic in 1 month for annual exam and HTN f/u.  We'll see what readings look like at that time and update labs.

## 2020-02-13 NOTE — Progress Notes (Signed)
Erik Estrada - 69 y.o. male MRN [id number]  Date of birth: 1951-06-16   This visit type was conducted due to national recommendations for restrictions regarding the COVID-19 Pandemic (e.g. social distancing).  This format is felt to be most appropriate for this patient at this time.  All issues noted in this document were discussed and addressed.  No physical exam was performed (except for noted visual exam findings with Video Visits).  I discussed the limitations of evaluation and management by telemedicine and the availability of in person appointments. The patient expressed understanding and agreed to proceed.  I connected with@ on 02/13/20 at  3:20 PM EST by a video enabled telemedicine application and verified that I am speaking with the correct person using two identifiers.  Present at visit: Erik Nutting, DO Erik Estrada   Patient Location: Home  [address] [place] [address]   Provider location:   [facility]  Chief Complaint  Patient presents with  . Hypertension    HPI  Erik Estrada is a 69 y.o. male who presents via audio/video conferencing for a telehealth visit today.  He is following up today for hypertension.  He is currently treated with lisinopril 20mg  daily.  He reports he is doing well with this.  His BP at home is a little elevated today.  He denies symptoms including headache, chest pain, shortness of breath, or vision changes.    He is a current smoker.  He has no desire to quit at this time.    ROS:  A comprehensive ROS was completed and negative except as noted per HPI  Past Medical History:  Diagnosis Date  . Essential hypertension, benign 06/29/2011  . Hemorrhoids 08/01/2018  . Morbid obesity ([place]) 10/12/2018  . 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 Prediabetes 05/16/2012  . Testosterone deficiency 10/12/2018  . Tubular adenoma of colon 10/17/2018   Colonoscopy 2016 at [facility].  Plan for 5-year recheck.  . Vitamin D deficiency  08/01/2017    Past Surgical History:  Procedure Laterality Date  . KIDNEY STONE SURGERY    . SHOULDER SURGERY  December 2010   Left  . UMBILICAL HERNIA REPAIR      Family History  Problem Relation Age of Onset  . Colon cancer Brother   . Hypertension Mother   . Parkinson's disease Mother   . Stroke Brother     Social History   Socioeconomic History  . Marital status: Married    Spouse name: Caren Griffins  . Number of children: 2  . Years of education: Not on file  . Highest education level: Not on file  Occupational History  . Occupation: Public librarian    Comment: [employer]  Tobacco Use  . Smoking status: Current Every Day Smoker    Packs/day: 2.00    Types: Cigarettes  . Smokeless tobacco: Never Used  Substance and Sexual Activity  . Alcohol use: No  . Drug use: No  . Sexual activity: Yes    Partners: Female  Other Topics Concern  . Not on file  Social History Narrative   2 caffeinated drinks daily. No regular exercise he does have a physical job.   Social Determinants of Health   Financial Resource Strain:   . Difficulty of Paying Living Expenses: Not on file  Food Insecurity:   . Worried About Charity fundraiser in the Last Year: Not on file  . Ran Out of Food in the Last Year: Not on file  Transportation Needs:   .  Lack of Transportation (Medical): Not on file  . Lack of Transportation (Non-Medical): Not on file  Physical Activity:   . Days of Exercise per Week: Not on file  . Minutes of Exercise per Session: Not on file  Stress:   . Feeling of Stress : Not on file  Social Connections:   . Frequency of Communication with Friends and Family: Not on file  . Frequency of Social Gatherings with Friends and Family: Not on file  . Attends Religious Services: Not on file  . Active Member of Clubs or Organizations: Not on file  . Attends Archivist Meetings: Not on file  . Marital Status: Not on file  Intimate Partner Violence:   . Fear of  Current or Ex-Partner: Not on file  . Emotionally Abused: Not on file  . Physically Abused: Not on file  . Sexually Abused: Not on file     Current Outpatient Medications:  .  lisinopril (ZESTRIL) 20 MG tablet, Take 1 tablet (20 mg total) by mouth daily., Disp: 90 tablet, Rfl: 1 .  Vitamin D, Cholecalciferol, 1000 units TABS, Take 4,000 Units by mouth daily., Disp: , Rfl:   EXAM:  VITALS per patient if applicable: Ht 5\' 4"  (1.626 m)   Wt 220 lb (99.8 kg)   BMI 37.76 kg/m   GENERAL: alert, oriented, appears well and in no acute distress  HEENT: atraumatic, conjunttiva clear, no obvious abnormalities on inspection of external nose and ears  NECK: normal movements of the head and neck  LUNGS: on inspection no signs of respiratory distress, breathing rate appears normal, no obvious gross SOB, gasping or wheezing  CV: no obvious cyanosis  MS: moves all visible extremities without noticeable abnormality  PSYCH/NEURO: pleasant and cooperative, no obvious depression or anxiety, speech and thought processing grossly intact  ASSESSMENT AND PLAN:  Discussed the following assessment and plan:  Essential hypertension, benign BP is elevated at home today.  Lisinopril refilled, he will f/u in clinic in 1 month for annual exam and HTN f/u.  We'll see what readings look like at that time and update labs.   Meds ordered this encounter  Medications  . lisinopril (ZESTRIL) 20 MG tablet    Sig: Take 1 tablet (20 mg total) by mouth daily.    Dispense:  90 tablet    Refill:  1   25 minutes spent including pre visit preparation, review of prior notes and labs, encounter with patient via video visit and same day documentation.    I discussed the assessment and treatment plan with the patient. The patient was provided an opportunity to ask questions and all were answered. The patient agreed with the plan and demonstrated an understanding of the instructions.   The patient was advised to  call back or seek an in-person evaluation if the symptoms worsen or if the condition fails to improve as anticipated.    Erik Nutting, DO

## 2020-03-12 ENCOUNTER — Ambulatory Visit (INDEPENDENT_AMBULATORY_CARE_PROVIDER_SITE_OTHER): Payer: Medicare HMO | Admitting: Family Medicine

## 2020-03-12 ENCOUNTER — Encounter: Payer: Self-pay | Admitting: Family Medicine

## 2020-03-12 ENCOUNTER — Other Ambulatory Visit: Payer: Self-pay

## 2020-03-12 VITALS — BP 140/82 | HR 90 | Ht 64.0 in | Wt 226.0 lb

## 2020-03-12 DIAGNOSIS — Z125 Encounter for screening for malignant neoplasm of prostate: Secondary | ICD-10-CM

## 2020-03-12 DIAGNOSIS — R7303 Prediabetes: Secondary | ICD-10-CM

## 2020-03-12 DIAGNOSIS — I10 Essential (primary) hypertension: Secondary | ICD-10-CM

## 2020-03-12 DIAGNOSIS — E782 Mixed hyperlipidemia: Secondary | ICD-10-CM | POA: Diagnosis not present

## 2020-03-12 DIAGNOSIS — D485 Neoplasm of uncertain behavior of skin: Secondary | ICD-10-CM | POA: Insufficient documentation

## 2020-03-12 DIAGNOSIS — E291 Testicular hypofunction: Secondary | ICD-10-CM | POA: Diagnosis not present

## 2020-03-12 DIAGNOSIS — Z720 Tobacco use: Secondary | ICD-10-CM

## 2020-03-12 NOTE — Assessment & Plan Note (Signed)
Counseled on quitting, declines medication to help with this.

## 2020-03-12 NOTE — Assessment & Plan Note (Signed)
Reports decreased libido and fatigue. Update testosterone.

## 2020-03-12 NOTE — Assessment & Plan Note (Signed)
Blood pressure is at goal at for age and co-morbidities.  I recommend he continue lisinopril at current dose.  In addition they were instructed to follow a low sodium diet with regular exercise to help to maintain adequate control of blood pressure.

## 2020-03-12 NOTE — Patient Instructions (Signed)
Great to see you today! Continue current medications.  Have labs completed today, we'll be in touch with results.  We'll see you again in about 6 months.

## 2020-03-12 NOTE — Progress Notes (Signed)
Erik Estrada - 69 y.o. male MRN [id number]  Date of birth: Feb 28, 1951  Subjective No chief complaint on file.   HPI Erik Estrada is a 69 y.o. male with history of HTN, HLD, nicotine dependence and prediabetes here today for follow up.   -HTN:  He is currently taking lisinopril 20mg  daily.  He reports that he is doing well with current strength of this.  BP readings at home have been well controlled.  He denies symptoms related to HTN including headache, chest pain, shortness of breath, or vision changes.   He is not on any medications for HLD.  He does have several skin lesion on his scalp and neck.  Has history of neurotic excoriations on he arms but denies picking at areas on scalp.    ROS:  A comprehensive ROS was completed and negative except as noted per HPI  Allergies  Allergen Reactions  . Hydrochlorothiazide Other (See Comments)    Fatigue  . Lipitor [Atorvastatin Calcium] Other (See Comments)    Myalgia    Past Medical History:  Diagnosis Date  . Essential hypertension, benign 06/29/2011  . Hemorrhoids 08/01/2018  . Morbid obesity ([place]) 10/12/2018  . 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 Prediabetes 05/16/2012  . Testosterone deficiency 10/12/2018  . Tubular adenoma of colon 10/17/2018   Colonoscopy 2016 at [facility].  Plan for 5-year recheck.  . Vitamin D deficiency 08/01/2017    Past Surgical History:  Procedure Laterality Date  . KIDNEY STONE SURGERY    . SHOULDER SURGERY  December 2010   Left  . UMBILICAL HERNIA REPAIR      Social History   Socioeconomic History  . Marital status: Married    Spouse name: Caren Griffins  . Number of children: 2  . Years of education: Not on file  . Highest education level: Not on file  Occupational History  . Occupation: Public librarian    Comment: [employer]  Tobacco Use  . Smoking status: Current Every Day Smoker    Packs/day: 2.00    Types: Cigarettes  . Smokeless tobacco: Never Used  Substance  and Sexual Activity  . Alcohol use: No  . Drug use: No  . Sexual activity: Yes    Partners: Female  Other Topics Concern  . Not on file  Social History Narrative   2 caffeinated drinks daily. No regular exercise he does have a physical job.   Social Determinants of Health   Financial Resource Strain:   . Difficulty of Paying Living Expenses:   Food Insecurity:   . Worried About Charity fundraiser in the Last Year:   . Arboriculturist in the Last Year:   Transportation Needs:   . Film/video editor (Medical):   Marland Kitchen Lack of Transportation (Non-Medical):   Physical Activity:   . Days of Exercise per Week:   . Minutes of Exercise per Session:   Stress:   . Feeling of Stress :   Social Connections:   . Frequency of Communication with Friends and Family:   . Frequency of Social Gatherings with Friends and Family:   . Attends Religious Services:   . Active Member of Clubs or Organizations:   . Attends Archivist Meetings:   Marland Kitchen Marital Status:     Family History  Problem Relation Age of Onset  . Colon cancer Brother   . Hypertension Mother   . Parkinson's disease Mother   . Stroke Brother     Health Maintenance  Topic Date Due  . COLONOSCOPY  11/29/2018  . INFLUENZA VACCINE  07/12/2020  . PNA vac Low Risk Adult (2 of 2 - PPSV23) 08/01/2022  . TETANUS/TDAP  11/15/2022  . Hepatitis C Screening  Completed     ----------------------------------------------------------------------------------------------------------------------------------------------------------------------------------------------------------------- Physical Exam BP 140/82   Pulse 90   Ht 5\' 4"  (1.626 m)   Wt 226 lb (102.5 kg)   BMI 38.79 kg/m   Physical Exam HENT:     Head: Normocephalic and atraumatic.     Right Ear: Tympanic membrane normal.     Left Ear: Tympanic membrane normal.  Eyes:     General: No scleral icterus. Cardiovascular:     Rate and Rhythm: Normal rate and  regular rhythm.  Pulmonary:     Effort: Pulmonary effort is normal.     Breath sounds: Normal breath sounds.  Musculoskeletal:     Cervical back: Neck supple.  Skin:    Comments: Several ulcerated appearing areas on scalp and neck.   Neurological:     General: No focal deficit present.     Mental Status: He is alert.  Psychiatric:        Mood and Affect: Mood normal.        Behavior: Behavior normal.     ------------------------------------------------------------------------------------------------------------------------------------------------------------------------------------------------------------------- Assessment and Plan  Essential hypertension, benign Blood pressure is at goal at for age and co-morbidities.  I recommend he continue lisinopril at current dose.  In addition they were instructed to follow a low sodium diet with regular exercise to help to maintain adequate control of blood pressure.    Hypogonadism male Reports decreased libido and fatigue. Update testosterone.   Neoplasm of uncertain behavior of skin Couple areas on scalp suspicious for [facility], referral placed to dermatology.   Tobacco abuse Counseled on quitting, declines medication to help with this.   Prediabetes Check a1c today.   Hyperlipidemia Update lipid panel today.    No orders of the defined types were placed in this encounter.   Return in about 6 months (around 09/11/2020) for HTN.    This visit occurred during the SARS-CoV-2 public health emergency.  Safety protocols were in place, including screening questions prior to the visit, additional usage of staff PPE, and extensive cleaning of exam room while observing appropriate contact time as indicated for disinfecting solutions.

## 2020-03-12 NOTE — Assessment & Plan Note (Signed)
Check a1c today

## 2020-03-12 NOTE — Assessment & Plan Note (Signed)
Update lipid panel today 

## 2020-03-12 NOTE — Assessment & Plan Note (Signed)
Couple areas on scalp suspicious for [facility], referral placed to dermatology.

## 2020-03-14 LAB — TSH: TSH: 2.21 mIU/L (ref 0.40–4.50)

## 2020-03-14 LAB — COMPLETE METABOLIC PANEL WITH GFR
AG Ratio: 1.6 (calc) (ref 1.0–2.5)
ALT: 16 U/L (ref 9–46)
AST: 15 U/L (ref 10–35)
Albumin: 4.4 g/dL (ref 3.6–5.1)
Alkaline phosphatase (APISO): 75 U/L (ref 35–144)
BUN: 11 mg/dL (ref 7–25)
CO2: 27 mmol/L (ref 20–32)
Calcium: 9.8 mg/dL (ref 8.6–10.3)
Chloride: 102 mmol/L (ref 98–110)
Creat: 1.09 mg/dL (ref 0.70–1.25)
GFR, Est African American: 82 mL/min/{1.73_m2} (ref >=60)
GFR, Est Non African American: 70 mL/min/{1.73_m2} (ref >=60)
Globulin: 2.8 g/dL (calc) (ref 1.9–3.7)
Glucose, Bld: 103 mg/dL — ABNORMAL HIGH (ref 65–99)
Potassium: 5.1 mmol/L (ref 3.5–5.3)
Sodium: 138 mmol/L (ref 135–146)
Total Bilirubin: 0.7 mg/dL (ref 0.2–1.2)
Total Protein: 7.2 g/dL (ref 6.1–8.1)

## 2020-03-14 LAB — HEMOGLOBIN A1C
Hgb A1c MFr Bld: 6.1 % of total Hgb — ABNORMAL HIGH (ref <5.7)
Mean Plasma Glucose: 128 (calc)
eAG (mmol/L): 7.1 (calc)

## 2020-03-14 LAB — CBC
HCT: 47 % (ref 38.5–50.0)
Hemoglobin: 15.9 g/dL (ref 13.2–17.1)
MCH: 30.9 pg (ref 27.0–33.0)
MCHC: 33.8 g/dL (ref 32.0–36.0)
MCV: 91.3 fL (ref 80.0–100.0)
MPV: 10.6 fL (ref 7.5–12.5)
Platelets: 253 10*3/uL (ref 140–400)
RBC: 5.15 10*6/uL (ref 4.20–5.80)
RDW: 12.8 % (ref 11.0–15.0)
WBC: 11 10*3/uL — ABNORMAL HIGH (ref 3.8–10.8)

## 2020-03-14 LAB — LIPID PANEL
Cholesterol: 208 mg/dL — ABNORMAL HIGH (ref <200)
HDL: 35 mg/dL — ABNORMAL LOW (ref >=40)
LDL Cholesterol (Calc): 150 mg/dL (calc) — ABNORMAL HIGH
Non-HDL Cholesterol (Calc): 173 mg/dL (calc) — ABNORMAL HIGH (ref <130)
Total CHOL/HDL Ratio: 5.9 (calc) — ABNORMAL HIGH (ref <5.0)
Triglycerides: 115 mg/dL (ref <150)

## 2020-03-14 LAB — TESTOSTERONE: Testosterone: 354 ng/dL (ref 250–827)

## 2020-03-14 LAB — PSA: PSA: 2.2 ng/mL (ref <=4.0)

## 2020-05-18 ENCOUNTER — Ambulatory Visit: Payer: Medicare HMO | Admitting: Dermatology

## 2020-05-18 ENCOUNTER — Other Ambulatory Visit: Payer: Self-pay

## 2020-05-18 DIAGNOSIS — D485 Neoplasm of uncertain behavior of skin: Secondary | ICD-10-CM | POA: Diagnosis not present

## 2020-05-18 DIAGNOSIS — L299 Pruritus, unspecified: Secondary | ICD-10-CM

## 2020-05-18 MED ORDER — MUPIROCIN 2 % EX OINT: 1.0000 "application " | TOPICAL_OINTMENT | Freq: Two times a day (BID) | CUTANEOUS | 1 refills | Status: DC

## 2020-05-18 NOTE — Patient Instructions (Signed)

## 2020-05-24 ENCOUNTER — Encounter: Payer: Self-pay | Admitting: Dermatology

## 2020-05-25 NOTE — Progress Notes (Signed)
   Follow-Up Visit   Subjective  Erik Estrada is a 69 y.o. male who presents for the following: Skin Problem (here to check spot on right neck. Patients PCP Dr. Rodena Piety thinks it should be checked. No bleeding. Check spot right side face x 2-3 days. ).  Nodule Location: Back of neck Duration:  Quality:  Associated Signs/Symptoms: Modifying Factors:  Severity:  Timing: Context:   The following portions of the chart were reviewed this encounter and updated as appropriate: Tobacco  Allergies  Meds  Problems  Med Hx  Surg Hx  Fam Hx      Objective  Well appearing patient in no apparent distress; mood and affect are within normal limits.  All skin waist up examined.   Assessment & Plan  Neoplasm of uncertain behavior of skin Mid Occipital Scalp  Skin / nail biopsy Type of biopsy: tangential   Informed consent: discussed and consent obtained   Timeout: patient name, date of birth, surgical site, and procedure verified   Procedure prep:  Patient was prepped and draped in usual sterile fashion Prep type:  Chlorhexidine Anesthesia: the lesion was anesthetized in a standard fashion   Anesthetic:  1% lidocaine w/ epinephrine 1-100,000 local infiltration Instrument used: flexible razor blade   Hemostasis achieved with: ferric subsulfate   Outcome: patient tolerated procedure well   Post-procedure details: wound care instructions given    Specimen 1 - Surgical pathology Differential Diagnosis: scc vs bcc Check Margins: No  Mupirocin oint ASAP new spot  Ordered Medications: mupirocin ointment (BACTROBAN) 2 %  Pruritus (5) Neck - Posterior; Left Upper Arm - Posterior; Left Upper Back; Right Upper Back (2)  Will await results of neck biopsy.

## 2020-08-14 ENCOUNTER — Other Ambulatory Visit: Payer: Self-pay

## 2020-08-14 ENCOUNTER — Ambulatory Visit: Payer: Medicare HMO | Admitting: Family Medicine

## 2020-08-14 ENCOUNTER — Ambulatory Visit: Payer: Self-pay

## 2020-08-14 VITALS — BP 138/84 | Ht 64.0 in | Wt 225.0 lb

## 2020-08-14 DIAGNOSIS — M25561 Pain in right knee: Secondary | ICD-10-CM

## 2020-08-14 NOTE — Patient Instructions (Signed)
Nice to meet you Please try the pennsaid  Please try the exercises  Please try heat  Please try the brace   Please send me a message in MyChart with any questions or updates.  Please see me back in 4 weeks.   --Dr. Raeford Razor

## 2020-08-14 NOTE — Progress Notes (Signed)
Erik Estrada - 69 y.o. male MRN [id number]  Date of birth: 05/15/1951  SUBJECTIVE:  Including CC & ROS.  No chief complaint on file.   Erik Estrada is a 69 y.o. male that is presenting with right knee pain.  The pain is over the medial aspect.  He was working on fabricating some metal and the drill hit him on the medial aspect of the knee.  Has been ongoing for about 2 months.  No history of similar pain or injury.  Seems to be ongoing.  It is tender to the touch.  Initially had significant ecchymosis and swelling over the area.   Review of Systems See HPI   HISTORY: Past Medical, Surgical, Social, and Family History Reviewed & Updated per EMR.   Pertinent Historical Findings include:  Past Medical History:  Diagnosis Date  . Essential hypertension, benign 06/29/2011  . Hemorrhoids 08/01/2018  . Morbid obesity ([place]) 10/12/2018  . 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 Prediabetes 05/16/2012  . Testosterone deficiency 10/12/2018  . Tubular adenoma of colon 10/17/2018   Colonoscopy 2016 at [facility].  Plan for 5-year recheck.  . Vitamin D deficiency 08/01/2017    Past Surgical History:  Procedure Laterality Date  . KIDNEY STONE SURGERY    . SHOULDER SURGERY  December 2010   Left  . UMBILICAL HERNIA REPAIR      Family History  Problem Relation Age of Onset  . Colon cancer Brother   . Hypertension Mother   . Parkinson's disease Mother   . Stroke Brother     Social History   Socioeconomic History  . Marital status: Married    Spouse name: Erik Estrada  . Number of children: 2  . Years of education: Not on file  . Highest education level: Not on file  Occupational History  . Occupation: Public librarian    Comment: [employer]  Tobacco Use  . Smoking status: Current Every Day Smoker    Packs/day: 2.00    Types: Cigarettes  . Smokeless tobacco: Never Used  Vaping Use  . Vaping Use: Never used  Substance and Sexual Activity  . Alcohol use: No  . Drug  use: No  . Sexual activity: Yes    Partners: Female  Other Topics Concern  . Not on file  Social History Narrative   2 caffeinated drinks daily. No regular exercise he does have a physical job.   Social Determinants of Health   Financial Resource Strain:   . Difficulty of Paying Living Expenses: Not on file  Food Insecurity:   . Worried About Charity fundraiser in the Last Year: Not on file  . Ran Out of Food in the Last Year: Not on file  Transportation Needs:   . Lack of Transportation (Medical): Not on file  . Lack of Transportation (Non-Medical): Not on file  Physical Activity:   . Days of Exercise per Week: Not on file  . Minutes of Exercise per Session: Not on file  Stress:   . Feeling of Stress : Not on file  Social Connections:   . Frequency of Communication with Friends and Family: Not on file  . Frequency of Social Gatherings with Friends and Family: Not on file  . Attends Religious Services: Not on file  . Active Member of Clubs or Organizations: Not on file  . Attends Archivist Meetings: Not on file  . Marital Status: Not on file  Intimate Partner Violence:   . Fear of Current  or Ex-Partner: Not on file  . Emotionally Abused: Not on file  . Physically Abused: Not on file  . Sexually Abused: Not on file     PHYSICAL EXAM:  VS: BP 138/84   Ht 5\' 4"  (1.626 m)   Wt 225 lb (102.1 kg)   BMI 38.62 kg/m  Physical Exam Gen: NAD, alert, cooperative with exam, well-appearing MSK:  Right knee: No obvious effusion. Tender to palpation over the anterior medial knee. Normal range of motion. No instability valgus or varus stress testing. Mild soft tissue swelling. Neurovascularly intact  Limited ultrasound: Right knee:  No effusion in the suprapatellar pouch. Normal-appearing quadricep and patellar tendon. No changes over the medial meniscus or joint space. Hypoechoic change in the soft tissue over the anterior medial aspect of the knee.  This is  overlying the medial retinaculum.  The medial retinaculum appears to be intact with no significant hyperemia in the area.  This appears to be associated with the trauma that he discussed.  Summary: Soft tissue changes in the anterior medial aspect of the knee.  Ultrasound and interpretation by Erik Coots, MD    ASSESSMENT & PLAN:   Acute pain of right knee He has structural soft tissue changes where he was hit from the power drill.  Does not appear to be associated with the medial meniscus or bone. -Counseled on home exercise therapy and supportive care. -Brace. -Provided Pennsaid samples. -If no improvement can consider physical therapy or imaging.

## 2020-08-14 NOTE — Progress Notes (Signed)
Medication Samples have been provided to the patient.  Drug name: pennsaid       Strength: 2%        Qty: 2 boxes  VHQ:[id number]  Exp.Date:3/22  Dosing instructions: use a pea size amount and rub gently  The patient has been instructed regarding the correct time, dose, and frequency of taking this medication, including desired effects and most common side effects.   April Manson 11:55 AM 08/14/2020

## 2020-08-16 ENCOUNTER — Other Ambulatory Visit: Payer: Self-pay | Admitting: Family Medicine

## 2020-08-16 DIAGNOSIS — M1710 Unilateral primary osteoarthritis, unspecified knee: Secondary | ICD-10-CM | POA: Insufficient documentation

## 2020-08-16 DIAGNOSIS — M179 Osteoarthritis of knee, unspecified: Secondary | ICD-10-CM | POA: Insufficient documentation

## 2020-08-16 NOTE — Assessment & Plan Note (Signed)
He has structural soft tissue changes where he was hit from the power drill.  Does not appear to be associated with the medial meniscus or bone. -Counseled on home exercise therapy and supportive care. -Brace. -Provided Pennsaid samples. -If no improvement can consider physical therapy or imaging.

## 2020-09-10 ENCOUNTER — Ambulatory Visit (INDEPENDENT_AMBULATORY_CARE_PROVIDER_SITE_OTHER): Payer: Medicare HMO | Admitting: Family Medicine

## 2020-09-10 ENCOUNTER — Other Ambulatory Visit: Payer: Self-pay

## 2020-09-10 ENCOUNTER — Encounter: Payer: Self-pay | Admitting: Family Medicine

## 2020-09-10 VITALS — BP 126/76 | HR 74 | Wt 225.0 lb

## 2020-09-10 DIAGNOSIS — Z720 Tobacco use: Secondary | ICD-10-CM

## 2020-09-10 DIAGNOSIS — I10 Essential (primary) hypertension: Secondary | ICD-10-CM

## 2020-09-10 DIAGNOSIS — E291 Testicular hypofunction: Secondary | ICD-10-CM

## 2020-09-10 MED ORDER — LISINOPRIL 20 MG PO TABS: 20.0000 mg | ORAL_TABLET | Freq: Every day | ORAL | 0 refills | Status: DC

## 2020-09-10 NOTE — Assessment & Plan Note (Signed)
Counseled on smoking cessation and recommend quitting.  Discussed lung cancer screening, declines testing.

## 2020-09-10 NOTE — Assessment & Plan Note (Signed)
Blood pressure is at goal at for age and co-morbidities.  I recommend he continue lisinopril at current strength.  In addition they were instructed to follow a low sodium diet with regular exercise to help to maintain adequate control of blood pressure.

## 2020-09-10 NOTE — Assessment & Plan Note (Signed)
Update testosterone level.

## 2020-09-10 NOTE — Progress Notes (Signed)
Erik Estrada - 69 y.o. male MRN [id number]  Date of birth: [birth year]  Subjective No chief complaint on file.   HPI Erik Estrada is a 69 y.o. male here today for follow up of HTN.  BP currently treated with lisinopril 20mg  daily.  He is compliant with medication and denies side effects.  He has not had any symptoms related to HTN including chest pain, shortness of breath, palpitations, headache or vision changes.    He does continue to smoke. Currently smoking about 2ppd and has smoked for nearly 25 years.  He has not had lung cancer screening.  He has had several unsuccessful attempts at quitting.   He requests to have testosterone checked again.   ROS:  A comprehensive ROS was completed and negative except as noted per HPI  Allergies  Allergen Reactions  . Hydrochlorothiazide Other (See Comments)    Fatigue  . Lipitor [Atorvastatin Calcium] Other (See Comments)    Myalgia    Past Medical History:  Diagnosis Date  . Essential hypertension, benign 06/29/2011  . Hemorrhoids 08/01/2018  . Morbid obesity ([place]) 10/12/2018  . 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 Prediabetes 05/16/2012  . Testosterone deficiency 10/12/2018  . Tubular adenoma of colon 10/17/2018   [facility]oscopy [facility].  Plan for 5-year recheck.  . Vitamin D deficiency 08/01/2017    Past Surgical History:  Procedure Laterality Date  . KIDNEY STONE SURGERY    . SHOULDER SURGERY  December 2010   Left  . UMBILICAL HERNIA REPAIR      Social History   Socioeconomic History  . Marital status: Married    Spouse name: Caren Griffins  . Number of children: 2  . Years of education: Not on file  . Highest education level: Not on file  Occupational History  . Occupation: Public librarian    Comment: [employer]  Tobacco Use  . Smoking status: Current Every Day Smoker    Packs/day: 2.00    Types: Cigarettes  . Smokeless tobacco: Never Used  Vaping Use  . Vaping Use: Never used  Substance and  Sexual Activity  . Alcohol use: No  . Drug use: No  . Sexual activity: Yes    Partners: Female  Other Topics Concern  . Not on file  Social History Narrative   2 caffeinated drinks daily. No regular exercise he does have a physical job.   Social Determinants of Health   Financial Resource Strain:   . Difficulty of Paying Living Expenses: Not on file  Food Insecurity:   . Worried About Charity fundraiser in the Last Year: Not on file  . Ran Out of Food in the Last Year: Not on file  Transportation Needs:   . Lack of Transportation (Medical): Not on file  . Lack of Transportation (Non-Medical): Not on file  Physical Activity:   . Days of Exercise per Week: Not on file  . Minutes of Exercise per Session: Not on file  Stress:   . Feeling of Stress : Not on file  Social Connections:   . Frequency of Communication with Friends and Family: Not on file  . Frequency of Social Gatherings with Friends and Family: Not on file  . Attends Religious Services: Not on file  . Active Member of Clubs or Organizations: Not on file  . Attends Archivist Meetings: Not on file  . Marital Status: Not on file    Family History  Problem Relation Age of Onset  .  Colon cancer Brother   . Hypertension Mother   . Parkinson's disease Mother   . Stroke Brother     Health Maintenance  Topic Date Due  . COLONOSCOPY  11/29/2018  . INFLUENZA VACCINE  07/12/2020  . PNA vac Low Risk Adult (2 of 2 - PPSV23) 08/01/2022  . TETANUS/TDAP  11/15/2022  . COVID-19 Vaccine  Completed  . Hepatitis C Screening  Completed     ----------------------------------------------------------------------------------------------------------------------------------------------------------------------------------------------------------------- Physical Exam BP 126/76 (BP Location: Left Arm, Patient Position: Sitting)   Pulse 74   Wt 225 lb (102.1 kg)   SpO2 99%   BMI 38.62 kg/m   Physical  Exam Constitutional:      Appearance: Normal appearance.  HENT:     Head: Normocephalic and atraumatic.  Eyes:     General: No scleral icterus. Cardiovascular:     Rate and Rhythm: Normal rate and regular rhythm.  Musculoskeletal:     Cervical back: Neck supple.  Skin:    General: Skin is warm and dry.  Neurological:     General: No focal deficit present.     Mental Status: He is alert.     ------------------------------------------------------------------------------------------------------------------------------------------------------------------------------------------------------------------- Assessment and Plan  Essential hypertension, benign Blood pressure is at goal at for age and co-morbidities.  I recommend he continue lisinopril at current strength.  In addition they were instructed to follow a low sodium diet with regular exercise to help to maintain adequate control of blood pressure.    Tobacco abuse Counseled on smoking cessation and recommend quitting.  Discussed lung cancer screening, declines testing.    Hypogonadism male Update testosterone level.    Meds ordered this encounter  Medications  . lisinopril (ZESTRIL) 20 MG tablet    Sig: Take 1 tablet (20 mg total) by mouth daily.    Dispense:  90 tablet    Refill:  0    Return in about 6 months (around 03/10/2021) for HTN.    This visit occurred during the SARS-CoV-2 public health emergency.  Safety protocols were in place, including screening questions prior to the visit, additional usage of staff PPE, and extensive cleaning of exam room while observing appropriate contact time as indicated for disinfecting solutions.

## 2020-09-10 NOTE — Patient Instructions (Signed)
Continue current medications Consider having lung cancer screening I recommend that you quit smoking.   Follow up with me in 6 months.

## 2020-09-14 ENCOUNTER — Encounter: Payer: Self-pay | Admitting: Family Medicine

## 2020-09-14 ENCOUNTER — Other Ambulatory Visit: Payer: Self-pay

## 2020-09-14 ENCOUNTER — Ambulatory Visit: Payer: Self-pay

## 2020-09-14 ENCOUNTER — Ambulatory Visit (INDEPENDENT_AMBULATORY_CARE_PROVIDER_SITE_OTHER): Payer: Medicare HMO | Admitting: Family Medicine

## 2020-09-14 ENCOUNTER — Ambulatory Visit (HOSPITAL_BASED_OUTPATIENT_CLINIC_OR_DEPARTMENT_OTHER)
Admission: RE | Admit: 2020-09-14 | Discharge: 2020-09-14 | Disposition: A | Discharge status: Home / Self Care | Payer: Medicare HMO | Source: Ambulatory Visit | Attending: Family Medicine | Admitting: Family Medicine

## 2020-09-14 VITALS — BP 150/76 | HR 86 | Ht 64.0 in | Wt 225.0 lb

## 2020-09-14 DIAGNOSIS — S83241A Other tear of medial meniscus, current injury, right knee, initial encounter: Secondary | ICD-10-CM | POA: Diagnosis not present

## 2020-09-14 MED ORDER — TRIAMCINOLONE ACETONIDE 40 MG/ML IJ SUSP
40.0000 mg | Freq: Once | INTRAMUSCULAR | Status: AC
  Administered 2020-09-14: 40 mg via INTRA_ARTICULAR

## 2020-09-14 NOTE — Assessment & Plan Note (Signed)
Pain is still ongoing for the past 3 to 4 months.  Likely result of the trauma sustained by the drill.  Concern for meniscal tear versus occult fracture. -Counseled supportive care. -Injection. -X-ray. -MRI to evaluate for occult fracture versus meniscal tear.

## 2020-09-14 NOTE — Patient Instructions (Signed)
Good to see you Please try ice  I will call with the xray results from today   Please send me a message in MyChart with any questions or updates.  We will setup a virtual visit once the MRI is resulted.   --Dr. Raeford Razor

## 2020-09-14 NOTE — Progress Notes (Signed)
Erik Estrada - 69 y.o. male MRN [id number]  Date of birth: [birth year]  SUBJECTIVE:  Including CC & ROS.  Chief Complaint  Patient presents with  . Follow-up    right knee    Erik Estrada is a 69 y.o. male that is presenting with worsening of his right knee pain.  His pain is still ongoing and seems a worsening of the pain.  Still occurring in the medial aspect.  In all his the results of the trauma he sustained when the drill hit the inside of his knee.   Review of Systems See HPI   HISTORY: Past Medical, Surgical, Social, and Family History Reviewed & Updated per EMR.   Pertinent Historical Findings include:  Past Medical History:  Diagnosis Date  . Essential hypertension, benign 06/29/2011  . Hemorrhoids 08/01/2018  . Morbid obesity ([place]) 10/12/2018  . 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 Prediabetes 05/16/2012  . Testosterone deficiency 10/12/2018  . Tubular adenoma of colon 10/17/2018   [facility]oscopy [facility].  Plan for 5-year recheck.  . Vitamin D deficiency 08/01/2017    Past Surgical History:  Procedure Laterality Date  . KIDNEY STONE SURGERY    . SHOULDER SURGERY  December 2010   Left  . UMBILICAL HERNIA REPAIR      Family History  Problem Relation Age of Onset  . Colon cancer Brother   . Hypertension Mother   . Parkinson's disease Mother   . Stroke Brother     Social History   Socioeconomic History  . Marital status: Married    Spouse name: Caren Griffins  . Number of children: 2  . Years of education: Not on file  . Highest education level: Not on file  Occupational History  . Occupation: Public librarian    Comment: [employer]  Tobacco Use  . Smoking status: Current Every Day Smoker    Packs/day: 2.00    Types: Cigarettes  . Smokeless tobacco: Never Used  Vaping Use  . Vaping Use: Never used  Substance and Sexual Activity  . Alcohol use: No  . Drug use: No  . Sexual activity: Yes    Partners: Female  Other Topics  Concern  . Not on file  Social History Narrative   2 caffeinated drinks daily. No regular exercise he does have a physical job.   Social Determinants of Health   Financial Resource Strain:   . Difficulty of Paying Living Expenses: Not on file  Food Insecurity:   . Worried About Charity fundraiser in the Last Year: Not on file  . Ran Out of Food in the Last Year: Not on file  Transportation Needs:   . Lack of Transportation (Medical): Not on file  . Lack of Transportation (Non-Medical): Not on file  Physical Activity:   . Days of Exercise per Week: Not on file  . Minutes of Exercise per Session: Not on file  Stress:   . Feeling of Stress : Not on file  Social Connections:   . Frequency of Communication with Friends and Family: Not on file  . Frequency of Social Gatherings with Friends and Family: Not on file  . Attends Religious Services: Not on file  . Active Member of Clubs or Organizations: Not on file  . Attends Archivist Meetings: Not on file  . Marital Status: Not on file  Intimate Partner Violence:   . Fear of Current or Ex-Partner: Not on file  . Emotionally Abused: Not on file  .  Physically Abused: Not on file  . Sexually Abused: Not on file     PHYSICAL EXAM:  VS: BP (!) 150/76   Pulse 86   Ht 5\' 4"  (1.626 m)   Wt 225 lb (102.1 kg)   BMI 38.62 kg/m  Physical Exam Gen: NAD, alert, cooperative with exam, well-appearing MSK:  Right knee: Effusion. Normal range of motion. Tenderness to palpation of the medial joint space. Neurovascular intact   Aspiration/Injection Procedure Note Melissa Pulido 1951/01/28  Procedure: Injection Indications: Right knee pain  Procedure Details Consent: Risks of procedure as well as the alternatives and risks of each were explained to the (patient/caregiver).  Consent for procedure obtained. Time Out: Verified patient identification, verified procedure, site/side was marked, verified correct patient position,  special equipment/implants available, medications/allergies/relevent history reviewed, required imaging and test results available.  Performed.  The area was cleaned with iodine and alcohol swabs.    The right knee superior lateral suprapatellar pouch was injected using 1 cc's of 40 mg Kenalog and 4 cc's of 0.25% bupivacaine with a 22 1 1/2" needle.  Ultrasound was used. Images were obtained in long views showing the injection.     A sterile dressing was applied.  Patient did tolerate procedure well.     ASSESSMENT & PLAN:   Right knee meniscal tear Pain is still ongoing for the past 3 to 4 months.  Likely result of the trauma sustained by the drill.  Concern for meniscal tear versus occult fracture. -Counseled supportive care. -Injection. -X-ray. -MRI to evaluate for occult fracture versus meniscal tear.

## 2020-09-15 NOTE — Addendum Note (Signed)
Addended by: Sherrie George F on: 09/15/2020 12:21 PM   Modules accepted: Orders

## 2020-09-16 ENCOUNTER — Telehealth: Payer: Self-pay | Admitting: Family Medicine

## 2020-09-16 NOTE — Telephone Encounter (Signed)
Informed of xray results.   Rosemarie Ax, MD [facility] 09/16/2020, 1:54 PM

## 2020-09-29 ENCOUNTER — Encounter: Payer: Self-pay | Admitting: Family Medicine

## 2020-09-30 ENCOUNTER — Telehealth: Payer: Self-pay | Admitting: Family Medicine

## 2020-10-13 ENCOUNTER — Other Ambulatory Visit: Payer: Self-pay

## 2020-10-13 ENCOUNTER — Telehealth (INDEPENDENT_AMBULATORY_CARE_PROVIDER_SITE_OTHER): Payer: Medicare HMO | Admitting: Family Medicine

## 2020-10-13 DIAGNOSIS — S83211D Bucket-handle tear of medial meniscus, current injury, right knee, subsequent encounter: Secondary | ICD-10-CM

## 2020-10-13 NOTE — Assessment & Plan Note (Signed)
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  Has had significant improvement with the injection.  We have tried bracing as well. -Counseled on home exercise therapy and supportive care. -Could consider gel injection or physical therapy.

## 2020-10-13 NOTE — Progress Notes (Signed)
Virtual Visit via Telephone Note  I connected with Erik Estrada on 10/13/20 at  1:00 PM EDT by telephone and verified that I am speaking with the correct person using two identifiers.  Location: Patient: home Provider: office   I discussed the limitations, risks, security and privacy concerns of performing an evaluation and management service by telephone and the availability of in person appointments. I also discussed with the patient that there may be a patient responsible charge related to this service. The patient expressed understanding and agreed to proceed.   History of Present Illness:  Mr. Erik Estrada is a 69 year old male that is following up after the MRI of his right knee.  This was revealing for a flap of bucket-handle tear of the medial meniscus with displaced meniscal fragment adjacent to the lateral aspect of the anterior root.  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  There is a low-grade chronic appearing sprain of the MCL with small joint effusion as well.   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  Has had significant improvement with the injection.  We have tried bracing as well. -Counseled on home exercise therapy and supportive care. -Could consider gel injection or physical therapy.  Follow Up Instructions:    I discussed the assessment and treatment plan with the patient. The patient was provided an opportunity to ask questions and all were answered. The patient agreed with the plan and demonstrated an understanding of the instructions.   The patient was advised to call back or seek an in-person evaluation if the symptoms worsen or if the condition fails to improve as anticipated.  I provided 9 minutes of non-face-to-face time during this encounter.   Clearance Coots,  MD

## 2021-01-29 ENCOUNTER — Other Ambulatory Visit: Payer: Self-pay

## 2021-01-29 ENCOUNTER — Ambulatory Visit (INDEPENDENT_AMBULATORY_CARE_PROVIDER_SITE_OTHER): Payer: Medicare Other | Admitting: Family Medicine

## 2021-01-29 ENCOUNTER — Ambulatory Visit: Payer: Self-pay

## 2021-01-29 ENCOUNTER — Encounter: Payer: Self-pay | Admitting: Family Medicine

## 2021-01-29 VITALS — BP 126/64 | Ht 64.0 in | Wt 225.0 lb

## 2021-01-29 DIAGNOSIS — M23252 Derangement of posterior horn of lateral meniscus due to old tear or injury, left knee: Secondary | ICD-10-CM | POA: Diagnosis not present

## 2021-01-29 DIAGNOSIS — M25562 Pain in left knee: Secondary | ICD-10-CM

## 2021-01-29 MED ORDER — TRIAMCINOLONE ACETONIDE 40 MG/ML IJ SUSP
40.0000 mg | Freq: Once | INTRAMUSCULAR | Status: AC
  Administered 2021-01-29: 40 mg via INTRA_ARTICULAR

## 2021-01-29 NOTE — Addendum Note (Signed)
Addended by: Cresenciano Lick on: 01/29/2021 12:01 PM   Modules accepted: Orders

## 2021-01-29 NOTE — Assessment & Plan Note (Signed)
Having symptoms more posterior.  Seems less likely for hamstring strain or radicular symptoms.  More likely meniscus irritation with pain with weightbearing. -Counseled on home exercise therapy and supportive care. -Injection. -Could consider physical therapy or imaging.

## 2021-01-29 NOTE — Progress Notes (Signed)
Erik Estrada - 70 y.o. male MRN [id number]  Date of birth: 04/22/1951  SUBJECTIVE:  Including CC & ROS.  No chief complaint on file.   Erik Estrada is a 69 y.o. male that is presenting with acute left knee pain.  Pain has been ongoing for the past week or so.  Is is posterior in nature.  No inciting event or trauma.   Review of Systems See HPI   HISTORY: Past Medical, Surgical, Social, and Family History Reviewed & Updated per EMR.   Pertinent Historical Findings include:  Past Medical History:  Diagnosis Date  . Essential hypertension, benign 06/29/2011  . Hemorrhoids 08/01/2018  . Morbid obesity ([place]) 10/12/2018  . 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 Prediabetes 05/16/2012  . Testosterone deficiency 10/12/2018  . Tubular adenoma of colon 10/17/2018   [facility]oscopy [facility].  Plan for 5-year recheck.  . Vitamin D deficiency 08/01/2017    Past Surgical History:  Procedure Laterality Date  . KIDNEY STONE SURGERY    . SHOULDER SURGERY  December 2010   Left  . UMBILICAL HERNIA REPAIR      Family History  Problem Relation Age of Onset  . Colon cancer Brother   . Hypertension Mother   . Parkinson's disease Mother   . Stroke Brother     Social History   Socioeconomic History  . Marital status: Married    Spouse name: Caren Griffins  . Number of children: 2  . Years of education: Not on file  . Highest education level: Not on file  Occupational History  . Occupation: Public librarian    Comment: [employer]  Tobacco Use  . Smoking status: Current Every Day Smoker    Packs/day: 2.00    Types: Cigarettes  . Smokeless tobacco: Never Used  Vaping Use  . Vaping Use: Never used  Substance and Sexual Activity  . Alcohol use: No  . Drug use: No  . Sexual activity: Yes    Partners: Female  Other Topics Concern  . Not on file  Social History Narrative   2 caffeinated drinks daily. No regular exercise he does have a physical job.   Social  Determinants of Health   Financial Resource Strain: Not on file  Food Insecurity: Not on file  Transportation Needs: Not on file  Physical Activity: Not on file  Stress: Not on file  Social Connections: Not on file  Intimate Partner Violence: Not on file     PHYSICAL EXAM:  VS: BP 126/64 (BP Location: Left Arm, Patient Position: Sitting, Cuff Size: Large)   Ht 5\' 4"  (1.626 m)   Wt 225 lb (102.1 kg)   BMI 38.62 kg/m  Physical Exam Gen: NAD, alert, cooperative with exam, well-appearing MSK:  Left knee: Limited range of motion. Instability with valgus varus stress testing. Positive Murray's test. Neurovascular intact  Limited ultrasound: Left knee:  Mild effusion. Normal-appearing quadricep and patellar tendon. Fairly normal medial joint space. Mild degenerative change of the lateral joint space. Some hyperemia of the posterior lateral corner of the meniscus  Summary: Findings are suggestive of acute irritation of degenerative meniscus.  Ultrasound and interpretation by Clearance Coots, MD   Aspiration/Injection Procedure Note Erik Estrada [birth year]  Procedure: Injection Indications: Left knee pain  Procedure Details Consent: Risks of procedure as well as the alternatives and risks of each were explained to the (patient/caregiver).  Consent for procedure obtained. Time Out: Verified patient identification, verified procedure, site/side was marked, verified correct patient position, special  equipment/implants available, medications/allergies/relevent history reviewed, required imaging and test results available.  Performed.  The area was cleaned with iodine and alcohol swabs.    The left knee superior lateral suprapatellar approach was injected using 1 cc's of 40 mg Kenalog and 4 cc's of 0.25% bupivacaine with a 22 1 1/2" needle.  Ultrasound was used. Images were obtained in long views showing the injection.     A sterile dressing was applied.  Patient did  tolerate procedure well.     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  Seems less likely for hamstring strain or radicular symptoms.  More likely meniscus irritation with pain with weightbearing. -Counseled on home exercise therapy and supportive care. -Injection. -Could consider physical therapy or imaging.

## 2021-01-29 NOTE — Patient Instructions (Signed)
Good to see you Please try ice   Please send me a message in MyChart with any questions or updates.  Please see me back in 4 weeks.   --Dr. Nataki Mccrumb  

## 2021-02-14 ENCOUNTER — Other Ambulatory Visit: Payer: Self-pay | Admitting: Family Medicine

## 2021-02-15 MED ORDER — LISINOPRIL 20 MG PO TABS
20.0000 mg | ORAL_TABLET | Freq: Every day | ORAL | 0 refills | Status: DC
Start: 2021-02-15 — End: 2021-05-19

## 2021-02-26 ENCOUNTER — Other Ambulatory Visit: Payer: Self-pay

## 2021-02-26 ENCOUNTER — Ambulatory Visit (INDEPENDENT_AMBULATORY_CARE_PROVIDER_SITE_OTHER): Payer: Medicare Other | Admitting: Family Medicine

## 2021-02-26 ENCOUNTER — Encounter: Payer: Self-pay | Admitting: Family Medicine

## 2021-02-26 VITALS — BP 138/74 | HR 90 | Ht 64.0 in | Wt 225.0 lb

## 2021-02-26 DIAGNOSIS — S76312A Strain of muscle, fascia and tendon of the posterior muscle group at thigh level, left thigh, initial encounter: Secondary | ICD-10-CM | POA: Diagnosis not present

## 2021-02-26 MED ORDER — PREDNISONE 5 MG PO TABS: ORAL_TABLET | ORAL | 0 refills | Status: DC

## 2021-02-26 NOTE — Progress Notes (Signed)
  Erik Estrada - 70 y.o. male MRN [id number]  Date of birth: March 06, 1951  SUBJECTIVE:  Including CC & ROS.  Chief Complaint  Patient presents with  . Follow-up    Left knee pain. States he is not doing any better. The pain is behind the left knee. Patient states he feels like the muscle is pulsating and has a lot of tension. Worse when going up steps. Cannot put pressure on that knee because it feels like it will give way.     Erik Estrada is a 70 y.o. male that is presenting with left leg pain.  He got an injection last time which did not resolve his symptoms.   Review of Systems See HPI   HISTORY: Past Medical, Surgical, Social, and Family History Reviewed & Updated per EMR.   Pertinent Historical Findings include:  Past Medical History:  Diagnosis Date  . Essential hypertension, benign 06/29/2011  . Hemorrhoids 08/01/2018  . Morbid obesity ([place]) 10/12/2018  . 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 Prediabetes 05/16/2012  . Testosterone deficiency 10/12/2018  . Tubular adenoma of colon 10/17/2018   [facility]onoscopy [facility].  Plan for 5-year recheck.  . Vitamin D deficiency 08/01/2017    Past Surgical History:  Procedure Laterality Date  . KIDNEY STONE SURGERY    . SHOULDER SURGERY  December 2010   Left  . UMBILICAL HERNIA REPAIR      Family History  Problem Relation Age of Onset  . Colon cancer Brother   . Hypertension Mother   . Parkinson's disease Mother   . Stroke Brother     Social History   Socioeconomic History  . Marital status: Married    Spouse name: Caren Griffins  . Number of children: 2  . Years of education: Not on file  . Highest education level: Not on file  Occupational History  . Occupation: Public librarian    Comment: [employer]  Tobacco Use  . Smoking status: Current Every Day Smoker    Packs/day: 2.00    Types: Cigarettes  . Smokeless tobacco: Never Used  Vaping Use  . Vaping Use: Never used  Substance and Sexual  Activity  . Alcohol use: No  . Drug use: No  . Sexual activity: Yes    Partners: Female  Other Topics Concern  . Not on file  Social History Narrative   2 caffeinated drinks daily. No regular exercise he does have a physical job.   Social Determinants of Health   Financial Resource Strain: Not on file  Food Insecurity: Not on file  Transportation Needs: Not on file  Physical Activity: Not on file  Stress: Not on file  Social Connections: Not on file  Intimate Partner Violence: Not on file     PHYSICAL EXAM:  VS: BP 138/74 (BP Location: Left Arm, Patient Position: Sitting, Cuff Size: Large)   Pulse 90   Ht 5\' 4"  (1.626 m)   Wt 225 lb (102.1 kg)   SpO2 96%   BMI 38.62 kg/m  Physical Exam Gen: NAD, alert, cooperative with exam, well-appearing MSK:  Left leg: Tenderness at the distal hamstring. No ecchymosis or swelling. Normal range of motion of the knee. Neurovascular intact     ASSESSMENT & PLAN:   Hamstring strain, left, initial encounter Has pain with the biceps femoris. No ecchymosis on exam. Likely a strain  -Counseled on home exercise therapy and supportive care. -Ace wrap. -Prednisone. -Could consider physical therapy or further imaging.

## 2021-02-26 NOTE — Assessment & Plan Note (Signed)
Has pain with the biceps femoris. No ecchymosis on exam. Likely a strain  -Counseled on home exercise therapy and supportive care. -Ace wrap. -Prednisone. -Could consider physical therapy or further imaging.

## 2021-02-26 NOTE — Patient Instructions (Signed)
Good to see you  Please send me a message in MyChart with any questions or updates.  Please see me back in .   --Dr. Raeford Razor

## 2021-03-10 ENCOUNTER — Other Ambulatory Visit: Payer: Self-pay

## 2021-03-10 ENCOUNTER — Ambulatory Visit (INDEPENDENT_AMBULATORY_CARE_PROVIDER_SITE_OTHER): Payer: Medicare Other | Admitting: Family Medicine

## 2021-03-10 ENCOUNTER — Encounter: Payer: Self-pay | Admitting: Family Medicine

## 2021-03-10 VITALS — BP 130/73 | HR 77 | Ht 64.0 in | Wt 228.0 lb

## 2021-03-10 DIAGNOSIS — I10 Essential (primary) hypertension: Secondary | ICD-10-CM | POA: Diagnosis not present

## 2021-03-10 DIAGNOSIS — E291 Testicular hypofunction: Secondary | ICD-10-CM

## 2021-03-10 DIAGNOSIS — R7303 Prediabetes: Secondary | ICD-10-CM

## 2021-03-10 DIAGNOSIS — E782 Mixed hyperlipidemia: Secondary | ICD-10-CM | POA: Diagnosis not present

## 2021-03-10 NOTE — Assessment & Plan Note (Signed)
Some increased fatigue as well as increased muscle and joint pain.  Update testosterone levels.

## 2021-03-10 NOTE — Assessment & Plan Note (Signed)
Update lipid panel today 

## 2021-03-10 NOTE — Assessment & Plan Note (Signed)
Blood pressure remains well controlled with lisinopril at current strength.  We will continue current medication and update labs.  Discussed smoking cessation as well as following a low-sodium diet.

## 2021-03-10 NOTE — Assessment & Plan Note (Signed)
Recheck A1c 

## 2021-03-10 NOTE — Patient Instructions (Signed)
Great to see you today! Continue current medication.  Follow up with Dr. Raeford Razor for the hamstring.  Have labs completed.  See me again in 6 months.

## 2021-03-10 NOTE — Progress Notes (Signed)
Erik Estrada - 70 y.o. male MRN [id number]  Date of birth: 1951/04/04  Subjective Chief Complaint  Patient presents with  . Hypertension    HPI Erik Estrada is a 70 y.o. male here today for follow-up of hypertension.  Hypertension is currently managed with lisinopril.  He is doing well with this denies side effects related to current medication.  Blood pressures well controlled at this time.  He denies chest pain, shortness of breath, palpitations, headache or vision changes.  He did see Dr. Raeford Razor recently as well for posterior thigh pain.  Diagnosed with hamstring strain.  He was given a course of steroids however did not note significant improvement with this.  He has follow-up with Dr. Raeford Razor in a few weeks.  He notes that he has had increased muscle and joint pain throughout does not feel like he has the same energy that he had couple years ago.  There is admit to not being quite as active.  He does have a history of low testosterone.  ROS:  A comprehensive ROS was completed and negative except as noted per HPI  Allergies  Allergen Reactions  . Hydrochlorothiazide Other (See Comments)    Fatigue  . Lipitor [Atorvastatin Calcium] Other (See Comments)    Myalgia    Past Medical History:  Diagnosis Date  . Essential hypertension, benign 06/29/2011  . Hemorrhoids 08/01/2018  . Morbid obesity ([place]) 10/12/2018  . 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 Prediabetes 05/16/2012  . Testosterone deficiency 10/12/2018  . Tubular adenoma of colon 10/17/2018   [facility].  Plan for 5-year recheck.  . Vitamin D deficiency 08/01/2017    Past Surgical History:  Procedure Laterality Date  . KIDNEY STONE SURGERY    . SHOULDER SURGERY  December 2010   Left  . UMBILICAL HERNIA REPAIR      Social History   Socioeconomic History  . Marital status: Married    Spouse name: Caren Griffins  . Number of children: 2  . Years of education: Not on file  . Highest  education level: Not on file  Occupational History  . Occupation: Public librarian    Comment: [employer]  Tobacco Use  . Smoking status: Current Every Day Smoker    Packs/day: 2.00    Types: Cigarettes  . Smokeless tobacco: Never Used  Vaping Use  . Vaping Use: Never used  Substance and Sexual Activity  . Alcohol use: No  . Drug use: No  . Sexual activity: Yes    Partners: Female  Other Topics Concern  . Not on file  Social History Narrative   2 caffeinated drinks daily. No regular exercise he does have a physical job.   Social Determinants of Health   Financial Resource Strain: Not on file  Food Insecurity: Not on file  Transportation Needs: Not on file  Physical Activity: Not on file  Stress: Not on file  Social Connections: Not on file    Family History  Problem Relation Age of Onset  . Colon cancer Brother   . Hypertension Mother   . Parkinson's disease Mother   . Stroke Brother     Health Maintenance  Topic Date Due  . COLONOSCOPY (Pts 45-29yrs Insurance coverage will need to be confirmed)  11/29/2018  . COVID-19 Vaccine (3 - Pfizer risk 4-dose series) 03/24/2020  . INFLUENZA VACCINE  03/11/2021 (Originally 07/12/2020)  . PNA vac Low Risk Adult (2 of 2 - PPSV23) 08/01/2022  . TETANUS/TDAP  11/15/2022  .  Hepatitis C Screening  Completed  . HPV VACCINES  Aged Out     ----------------------------------------------------------------------------------------------------------------------------------------------------------------------------------------------------------------- Physical Exam BP 130/73 (BP Location: Left Arm, Patient Position: Sitting, Cuff Size: Large)   Pulse 77   Ht 5\' 4"  (1.626 m)   Wt 228 lb (103.4 kg)   SpO2 98%   BMI 39.14 kg/m   Physical Exam Constitutional:      Appearance: Normal appearance.  HENT:     Head: Normocephalic and atraumatic.  Eyes:     General: No scleral icterus. Cardiovascular:     Rate and Rhythm: Normal  rate and regular rhythm.  Pulmonary:     Effort: Pulmonary effort is normal.     Breath sounds: Normal breath sounds.  Musculoskeletal:     Cervical back: Neck supple.  Neurological:     General: No focal deficit present.     Mental Status: He is alert.  Psychiatric:        Mood and Affect: Mood normal.        Behavior: Behavior normal.     ------------------------------------------------------------------------------------------------------------------------------------------------------------------------------------------------------------------- Assessment and Plan  Essential hypertension, benign Blood pressure remains well controlled with lisinopril at current strength.  We will continue current medication and update labs.  Discussed smoking cessation as well as following a low-sodium diet.  Hypogonadism male Some increased fatigue as well as increased muscle and joint pain.  Update testosterone levels.  Hyperlipidemia Update lipid panel today.  Prediabetes Recheck A1c.   No orders of the defined types were placed in this encounter.   Return in about 6 months (around 09/10/2021) for HTN.    This visit occurred during the SARS-CoV-2 public health emergency.  Safety protocols were in place, including screening questions prior to the visit, additional usage of staff PPE, and extensive cleaning of exam room while observing appropriate contact time as indicated for disinfecting solutions.

## 2021-03-16 DIAGNOSIS — I10 Essential (primary) hypertension: Secondary | ICD-10-CM | POA: Diagnosis not present

## 2021-03-16 DIAGNOSIS — E782 Mixed hyperlipidemia: Secondary | ICD-10-CM | POA: Diagnosis not present

## 2021-03-16 DIAGNOSIS — E291 Testicular hypofunction: Secondary | ICD-10-CM | POA: Diagnosis not present

## 2021-03-16 DIAGNOSIS — R7303 Prediabetes: Secondary | ICD-10-CM | POA: Diagnosis not present

## 2021-03-17 LAB — CBC
HCT: 46.1 % (ref 38.5–50.0)
Hemoglobin: 15.8 g/dL (ref 13.2–17.1)
MCH: 31.6 pg (ref 27.0–33.0)
MCHC: 34.3 g/dL (ref 32.0–36.0)
MCV: 92.2 fL (ref 80.0–100.0)
MPV: 10.7 fL (ref 7.5–12.5)
Platelets: 232 10*3/uL (ref 140–400)
RBC: 5 10*6/uL (ref 4.20–5.80)
RDW: 13.3 % (ref 11.0–15.0)
WBC: 7.1 10*3/uL (ref 3.8–10.8)

## 2021-03-17 LAB — COMPLETE METABOLIC PANEL WITH GFR
AG Ratio: 1.7 (calc) (ref 1.0–2.5)
ALT: 11 U/L (ref 9–46)
AST: 12 U/L (ref 10–35)
Albumin: 4.4 g/dL (ref 3.6–5.1)
Alkaline phosphatase (APISO): 69 U/L (ref 35–144)
BUN: 10 mg/dL (ref 7–25)
CO2: 25 mmol/L (ref 20–32)
Calcium: 9.4 mg/dL (ref 8.6–10.3)
Chloride: 104 mmol/L (ref 98–110)
Creat: 0.93 mg/dL (ref 0.70–1.25)
GFR, Est African American: 98 mL/min/{1.73_m2} (ref >=60)
GFR, Est Non African American: 85 mL/min/{1.73_m2} (ref >=60)
Globulin: 2.6 g/dL (calc) (ref 1.9–3.7)
Glucose, Bld: 116 mg/dL — ABNORMAL HIGH (ref 65–99)
Potassium: 4.8 mmol/L (ref 3.5–5.3)
Sodium: 139 mmol/L (ref 135–146)
Total Bilirubin: 0.6 mg/dL (ref 0.2–1.2)
Total Protein: 7 g/dL (ref 6.1–8.1)

## 2021-03-17 LAB — TESTOSTERONE: Testosterone: 368 ng/dL (ref 250–827)

## 2021-03-17 LAB — LIPID PANEL W/REFLEX DIRECT LDL
Cholesterol: 197 mg/dL (ref <200)
HDL: 36 mg/dL — ABNORMAL LOW (ref >=40)
LDL Cholesterol (Calc): 141 mg/dL (calc) — ABNORMAL HIGH
Non-HDL Cholesterol (Calc): 161 mg/dL (calc) — ABNORMAL HIGH (ref <130)
Total CHOL/HDL Ratio: 5.5 (calc) — ABNORMAL HIGH (ref <5.0)
Triglycerides: 90 mg/dL (ref <150)

## 2021-03-17 LAB — HEMOGLOBIN A1C
Hgb A1c MFr Bld: 6.3 % of total Hgb — ABNORMAL HIGH (ref <5.7)
Mean Plasma Glucose: 134 mg/dL
eAG (mmol/L): 7.4 mmol/L

## 2021-03-29 ENCOUNTER — Ambulatory Visit: Payer: Self-pay

## 2021-03-29 ENCOUNTER — Encounter: Payer: Self-pay | Admitting: Family Medicine

## 2021-03-29 ENCOUNTER — Ambulatory Visit (INDEPENDENT_AMBULATORY_CARE_PROVIDER_SITE_OTHER): Payer: Medicare Other | Admitting: Family Medicine

## 2021-03-29 ENCOUNTER — Other Ambulatory Visit: Payer: Self-pay

## 2021-03-29 VITALS — BP 162/70 | Ht 64.0 in | Wt 228.0 lb

## 2021-03-29 DIAGNOSIS — M1712 Unilateral primary osteoarthritis, left knee: Secondary | ICD-10-CM | POA: Diagnosis not present

## 2021-03-29 MED ORDER — TRIAMCINOLONE ACETONIDE 40 MG/ML IJ SUSP
40.0000 mg | Freq: Once | INTRAMUSCULAR | Status: AC
  Administered 2021-03-29: 40 mg via INTRA_ARTICULAR

## 2021-03-29 NOTE — Progress Notes (Signed)
Erik Estrada - 70 y.o. male MRN [id number]  Date of birth: 1951-02-20  SUBJECTIVE:  Including CC & ROS.  No chief complaint on file.   Erik Estrada is a 70 y.o. male that is presenting with worsening of his left knee pain.  The pain is posterior in nature.  Did get some improvement initially with the prednisone.  Pain is worse at the end of the day.   Review of Systems See HPI   HISTORY: Past Medical, Surgical, Social, and Family History Reviewed & Updated per EMR.   Pertinent Historical Findings include:  Past Medical History:  Diagnosis Date  . Essential hypertension, benign 06/29/2011  . Hemorrhoids 08/01/2018  . Morbid obesity ([place]) 10/12/2018  . 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 Prediabetes 05/16/2012  . Testosterone deficiency 10/12/2018  . Tubular adenoma of colon 10/17/2018   [facility]oscopy [facility].  Plan for 5-year recheck.  . Vitamin D deficiency 08/01/2017    Past Surgical History:  Procedure Laterality Date  . KIDNEY STONE SURGERY    . SHOULDER SURGERY  December 2010   Left  . UMBILICAL HERNIA REPAIR      Family History  Problem Relation Age of Onset  . Colon cancer Brother   . Hypertension Mother   . Parkinson's disease Mother   . Stroke Brother     Social History   Socioeconomic History  . Marital status: Married    Spouse name: Caren Griffins  . Number of children: 2  . Years of education: Not on file  . Highest education level: Not on file  Occupational History  . Occupation: Public librarian    Comment: [employer]  Tobacco Use  . Smoking status: Current Every Day Smoker    Packs/day: 2.00    Types: Cigarettes  . Smokeless tobacco: Never Used  Vaping Use  . Vaping Use: Never used  Substance and Sexual Activity  . Alcohol use: No  . Drug use: No  . Sexual activity: Yes    Partners: Female  Other Topics Concern  . Not on file  Social History Narrative   2 caffeinated drinks daily. No regular exercise he does have  a physical job.   Social Determinants of Health   Financial Resource Strain: Not on file  Food Insecurity: Not on file  Transportation Needs: Not on file  Physical Activity: Not on file  Stress: Not on file  Social Connections: Not on file  Intimate Partner Violence: Not on file     PHYSICAL EXAM:  VS: BP (!) 162/70 (BP Location: Left Arm, Patient Position: Sitting, Cuff Size: Large)   Ht 5\' 4"  (1.626 m)   Wt 228 lb (103.4 kg)   BMI 39.14 kg/m  Physical Exam Gen: NAD, alert, cooperative with exam, well-appearing MSK:  Left knee: Mild to moderate effusion. Normal range of motion. Normal strength resistance. Neurovascular intact   Aspiration/Injection Procedure Note Erik Estrada July 02, 1951  Procedure: Injection Indications: Left knee pain  Procedure Details Consent: Risks of procedure as well as the alternatives and risks of each were explained to the (patient/caregiver).  Consent for procedure obtained. Time Out: Verified patient identification, verified procedure, site/side was marked, verified correct patient position, special equipment/implants available, medications/allergies/relevent history reviewed, required imaging and test results available.  Performed.  The area was cleaned with iodine and alcohol swabs.    The Left knee superior lateral suprapatellar pouch was injected using 1 cc's of 40 mg Kenalog and 4 cc's of 0.25% bupivacaine with a 25  1 1/2" needle.  Ultrasound was used. Images were obtained in long views showing the injection.     A sterile dressing was applied.  Patient did tolerate procedure well.     ASSESSMENT & PLAN:   Primary osteoarthritis of left knee Symptoms seem more associated with degenerative changes of the knee as opposed to a hamstring strain.  Does have a mild to moderate effusion on exam today.  Could be associated with radicular component with limited improvement with the injection. -Counseled on home exercise therapy and  supportive care. - injection  -Could consider gel injections and imaging or physical therapy.

## 2021-03-29 NOTE — Assessment & Plan Note (Signed)
Symptoms seem more associated with degenerative changes of the knee as opposed to a hamstring strain.  Does have a mild to moderate effusion on exam today.  Could be associated with radicular component with limited improvement with the injection. -Counseled on home exercise therapy and supportive care. - injection  -Could consider gel injections and imaging or physical therapy.

## 2021-03-29 NOTE — Patient Instructions (Signed)
Good to see you Please try ice  Please let me know how your pain is after a few days   Please send me a message in MyChart with any questions or updates.  Please see me back in 4 weeks.   --Dr. Raeford Razor

## 2021-04-26 ENCOUNTER — Ambulatory Visit (INDEPENDENT_AMBULATORY_CARE_PROVIDER_SITE_OTHER): Payer: Medicare Other | Admitting: Family Medicine

## 2021-04-26 ENCOUNTER — Encounter: Payer: Self-pay | Admitting: Family Medicine

## 2021-04-26 ENCOUNTER — Other Ambulatory Visit: Payer: Self-pay

## 2021-04-26 DIAGNOSIS — M1712 Unilateral primary osteoarthritis, left knee: Secondary | ICD-10-CM

## 2021-04-26 DIAGNOSIS — M1711 Unilateral primary osteoarthritis, right knee: Secondary | ICD-10-CM | POA: Diagnosis not present

## 2021-04-26 NOTE — Assessment & Plan Note (Signed)
Acute on chronic in nature.  MRI from October 2021 showing degenerative changes. -Counseled on home exercise therapy and supportive care. -Pursue gel injection.

## 2021-04-26 NOTE — Assessment & Plan Note (Signed)
Acute on chronic in nature.  Still having intermittent pain from time to time.  Have tried a steroid injection. -Counseled on home exercise therapy and supportive care. -Pursue gel injection.

## 2021-04-26 NOTE — Progress Notes (Signed)
  Elson Ulbrich - 70 y.o. male MRN [id number]  Date of birth: Dec 17, 1950  SUBJECTIVE:  Including CC & ROS.  No chief complaint on file.   Addison Whidbee is a 70 y.o. male that is following up for his bilateral knee pain.  Has gotten improvement from previous steroid injection but has pain intermittently from time to time..   Review of Systems See HPI   HISTORY: Past Medical, Surgical, Social, and Family History Reviewed & Updated per EMR.   Pertinent Historical Findings include:  Past Medical History:  Diagnosis Date  . Essential hypertension, benign 06/29/2011  . Hemorrhoids 08/01/2018  . Morbid obesity ([place]) 10/12/2018  . 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 Prediabetes 05/16/2012  . Testosterone deficiency 10/12/2018  . Tubular adenoma of colon 10/17/2018   [facility].  Plan for 5-year recheck.  . Vitamin D deficiency 08/01/2017    Past Surgical History:  Procedure Laterality Date  . KIDNEY STONE SURGERY    . SHOULDER SURGERY  December 2010   Left  . UMBILICAL HERNIA REPAIR      Family History  Problem Relation Age of Onset  . Colon cancer Brother   . Hypertension Mother   . Parkinson's disease Mother   . Stroke Brother     Social History   Socioeconomic History  . Marital status: Married    Spouse name: Caren Griffins  . Number of children: 2  . Years of education: Not on file  . Highest education level: Not on file  Occupational History  . Occupation: Public librarian    Comment: [employer]  Tobacco Use  . Smoking status: Current Every Day Smoker    Packs/day: 2.00    Types: Cigarettes  . Smokeless tobacco: Never Used  Vaping Use  . Vaping Use: Never used  Substance and Sexual Activity  . Alcohol use: No  . Drug use: No  . Sexual activity: Yes    Partners: Female  Other Topics Concern  . Not on file  Social History Narrative   2 caffeinated drinks daily. No regular exercise he does have a physical job.   Social  Determinants of Health   Financial Resource Strain: Not on file  Food Insecurity: Not on file  Transportation Needs: Not on file  Physical Activity: Not on file  Stress: Not on file  Social Connections: Not on file  Intimate Partner Violence: Not on file     PHYSICAL EXAM:  VS: BP (!) 148/80 (BP Location: Left Arm, Patient Position: Sitting, Cuff Size: Large)   Ht 5\' 4"  (1.626 m)   Wt 228 lb (103.4 kg)   BMI 39.14 kg/m  Physical Exam Gen: NAD, alert, cooperative with exam, well-appearing     ASSESSMENT & PLAN:   Primary osteoarthritis of left knee Acute on chronic in nature.  Still having intermittent pain from time to time.  Have tried a steroid injection. -Counseled on home exercise therapy and supportive care. -Pursue gel injection.  OA (osteoarthritis) of knee Acute on chronic in nature.  MRI from October 2021 showing degenerative changes. -Counseled on home exercise therapy and supportive care. -Pursue gel injection.

## 2021-05-18 ENCOUNTER — Telehealth: Payer: Self-pay | Admitting: *Deleted

## 2021-05-18 ENCOUNTER — Other Ambulatory Visit: Payer: Self-pay | Admitting: Family Medicine

## 2021-05-18 NOTE — Telephone Encounter (Signed)
Received patient's SOB from [id number]. His Medicare plan covers 80% of the cost of Durolane and 80% of the admin coverage. I told him his approximate OOP cost is $420. He also states he has a secondary supplement from [facility]. ( We do not have this card on file) He states his wife just had surgery and he would like for me to speak with her about this as well. He is going to have her call me back so I can explain his plan benefits and provide her with the Durolane J code.

## 2021-05-19 NOTE — Telephone Encounter (Signed)
I resubmitted patients info on [id number] with secondary insurance card.

## 2021-05-26 ENCOUNTER — Encounter: Payer: Self-pay | Admitting: Family Medicine

## 2021-05-26 NOTE — Telephone Encounter (Signed)
Received benefit summary for pt's Durolane. His OOP is $0. OV scheduled for 05/27/21 @ 1:50. I ordered Durolane gel injection. Medication should be here by 10:30 am on 05/27/21. Patient's wife informed via [place].

## 2021-05-27 ENCOUNTER — Ambulatory Visit: Payer: Self-pay

## 2021-05-27 ENCOUNTER — Encounter: Payer: Self-pay | Admitting: Family Medicine

## 2021-05-27 ENCOUNTER — Ambulatory Visit (INDEPENDENT_AMBULATORY_CARE_PROVIDER_SITE_OTHER): Payer: Medicare Other | Admitting: Family Medicine

## 2021-05-27 ENCOUNTER — Other Ambulatory Visit: Payer: Self-pay

## 2021-05-27 ENCOUNTER — Ambulatory Visit (HOSPITAL_BASED_OUTPATIENT_CLINIC_OR_DEPARTMENT_OTHER)
Admission: RE | Admit: 2021-05-27 | Discharge: 2021-05-27 | Disposition: A | Discharge status: Home / Self Care | Payer: Medicare Other | Source: Ambulatory Visit | Attending: Family Medicine | Admitting: Family Medicine

## 2021-05-27 VITALS — BP 150/84 | Ht 64.0 in | Wt 228.0 lb

## 2021-05-27 DIAGNOSIS — M1712 Unilateral primary osteoarthritis, left knee: Secondary | ICD-10-CM | POA: Diagnosis present

## 2021-05-27 MED ORDER — HYDROCODONE-ACETAMINOPHEN 5-325 MG PO TABS: 1.0000 | ORAL_TABLET | Freq: Three times a day (TID) | ORAL | 0 refills | Status: DC | PRN

## 2021-05-27 MED ORDER — NAPROXEN 500 MG PO TABS: 500.0000 mg | ORAL_TABLET | Freq: Two times a day (BID) | ORAL | 1 refills | Status: DC | PRN

## 2021-05-27 NOTE — Progress Notes (Signed)
Medication Samples have been provided to the patient.  Drug name: Pennsaid       Strength: 2%        Qty: 1 box LOT: [id number]  Exp.Date: 06/2022  Dosing instructions: use a pea sized amount twice daiy  The patient has been instructed regarding the correct time, dose, and frequency of taking this medication, including desired effects and most common side effects.   Erik Estrada 2:18 PM 05/27/2021

## 2021-05-27 NOTE — Progress Notes (Signed)
Erik Estrada - 70 y.o. male MRN [id number]  Date of birth: 05-Sep-1951  SUBJECTIVE:  Including CC & ROS.  No chief complaint on file.   Erik Estrada is a 70 y.o. male that is presenting with acute worsening of his left knee pain.  He is able to walk without severe pain.   Review of Systems See HPI   HISTORY: Past Medical, Surgical, Social, and Family History Reviewed & Updated per EMR.   Pertinent Historical Findings include:  Past Medical History:  Diagnosis Date   Essential hypertension, benign 06/29/2011   Hemorrhoids 08/01/2018   Morbid obesity ([place]) 10/12/2018   Neurotic excoriations 07/27/2016   Prediabetes 05/16/2012   Testosterone deficiency 10/12/2018   Tubular adenoma of colon 10/17/2018   [facility].  Plan for 5-year recheck.   Vitamin D deficiency 08/01/2017    Past Surgical History:  Procedure Laterality Date   KIDNEY STONE SURGERY     SHOULDER SURGERY  December 9518   Left   UMBILICAL HERNIA REPAIR      Family History  Problem Relation Age of Onset   Colon cancer Brother    Hypertension Mother    Parkinson's disease Mother    Stroke Brother     Social History   Socioeconomic History   Marital status: Married    Spouse name: Caren Griffins   Number of children: 2   Years of education: Not on file   Highest education level: Not on file  Occupational History   Occupation: Public librarian    Comment: [employer]  Tobacco Use   Smoking status: Every Day    Packs/day: 2.00    Pack years: 0.00    Types: Cigarettes   Smokeless tobacco: Never  Vaping Use   Vaping Use: Never used  Substance and Sexual Activity   Alcohol use: No   Drug use: No   Sexual activity: Yes    Partners: Female  Other Topics Concern   Not on file  Social History Narrative   2 caffeinated drinks daily. No regular exercise he does have a physical job.   Social Determinants of Health   Financial Resource Strain: Not on file  Food  Insecurity: Not on file  Transportation Needs: Not on file  Physical Activity: Not on file  Stress: Not on file  Social Connections: Not on file  Intimate Partner Violence: Not on file     PHYSICAL EXAM:  VS: BP (!) 150/84 (BP Location: Right Arm, Patient Position: Sitting, Cuff Size: Large)   Ht 5\' 4"  (1.626 m)   Wt 228 lb (103.4 kg)   BMI 39.14 kg/m  Physical Exam Gen: NAD, alert, cooperative with exam, well-appearing MSK:  Left knee: Mild effusion. Normal range of motion. Tenderness palpation over the medial joint space in the medial aspect of the patella. Neurovascular intact    Aspiration/Injection Procedure Note Nicholis Stepanek 10/23/1951  Procedure: Injection Indications: Left knee pain  Procedure Details Consent: Risks of procedure as well as the alternatives and risks of each were explained to the (patient/caregiver).  Consent for procedure obtained. Time Out: Verified patient identification, verified procedure, site/side was marked, verified correct patient position, special equipment/implants available, medications/allergies/relevent history reviewed, required imaging and test results available.  Performed.  The area was cleaned with iodine and alcohol swabs.    The left knee superior lateral suprapatellar pouch was injected using 4 cc's of 1% lidocaine with a 22 1 1/2" needle.  The syringe was switched and a  60 mg per 3 mL of durolane was injected. Ultrasound was used. Images were obtained in  Long views showing the injection.    A sterile dressing was applied.  Patient did tolerate procedure well.     ASSESSMENT & PLAN:   Primary osteoarthritis of left knee Acute on chronic in nature. Worsening as of late and having trouble with walking.  - counseled on home exercise therapy and supportive care - xray  - gel injection  - norco  - naproxen.

## 2021-05-27 NOTE — Patient Instructions (Signed)
Good to see you Please try the naproxen to help with the pain until the gel injection kicks in.  Please use the norco for severe pain.  Please try the rub on medicine if the pain is mild   I will call once the xrays of the left knee are done.  Please send me a message in MyChart with any questions or updates.  We can do the gel injection for the right knee whenever you want .   --Dr. Raeford Razor

## 2021-05-27 NOTE — Assessment & Plan Note (Signed)
Acute on chronic in nature. Worsening as of late and having trouble with walking.  - counseled on home exercise therapy and supportive care - xray  - gel injection  - norco  - naproxen.

## 2021-06-03 ENCOUNTER — Encounter: Payer: Self-pay | Admitting: Family Medicine

## 2021-06-03 ENCOUNTER — Other Ambulatory Visit: Payer: Self-pay

## 2021-06-03 ENCOUNTER — Ambulatory Visit (INDEPENDENT_AMBULATORY_CARE_PROVIDER_SITE_OTHER): Payer: Medicare Other

## 2021-06-03 ENCOUNTER — Ambulatory Visit (INDEPENDENT_AMBULATORY_CARE_PROVIDER_SITE_OTHER): Payer: Medicare Other | Admitting: Family Medicine

## 2021-06-03 VITALS — BP 152/79 | HR 94 | Ht 64.0 in | Wt 229.0 lb

## 2021-06-03 DIAGNOSIS — M25551 Pain in right hip: Secondary | ICD-10-CM | POA: Diagnosis not present

## 2021-06-03 DIAGNOSIS — M2550 Pain in unspecified joint: Secondary | ICD-10-CM

## 2021-06-03 DIAGNOSIS — M25552 Pain in left hip: Secondary | ICD-10-CM

## 2021-06-03 DIAGNOSIS — M7062 Trochanteric bursitis, left hip: Secondary | ICD-10-CM

## 2021-06-03 DIAGNOSIS — R339 Retention of urine, unspecified: Secondary | ICD-10-CM

## 2021-06-03 DIAGNOSIS — M7061 Trochanteric bursitis, right hip: Secondary | ICD-10-CM

## 2021-06-03 MED ORDER — PREDNISONE 50 MG PO TABS: ORAL_TABLET | ORAL | 0 refills | Status: DC

## 2021-06-03 MED ORDER — METHYLPREDNISOLONE ACETATE 80 MG/ML IJ SUSP
80.0000 mg | Freq: Once | INTRAMUSCULAR | Status: AC
  Administered 2021-06-03: 80 mg via INTRAMUSCULAR

## 2021-06-03 NOTE — Assessment & Plan Note (Signed)
Given 80 mg of depo medrol today and will continue prednisone 50mg  daily x5 days.  He has had some groin pain as well.  Xrays of hips ordered.

## 2021-06-03 NOTE — Patient Instructions (Signed)
We'll be in touch with lab and xray results.  Pick up prednisone and take for 5 days.

## 2021-06-03 NOTE — Progress Notes (Signed)
Erik Estrada - 70 y.o. male MRN [id number]  Date of birth: 02/14/1951  Subjective Chief Complaint  Patient presents with   Hip Pain    HPI Erik Estrada is a 70 y.o. male here today with complaint of joint pain.  Pain located in knees and bilateral hips.  He has been seeing Dr. Raeford Razor for his knees and had Durolane injection. He really hasn't noticed any significant improvement with this.  He is now having bilateral hip pain.  Pain is located mostly along lateral hips with some pain in the groin area.  He has some mild foot pain as well.  He does not have numbness or tingling.  He has not noted much improvement with naproxen.    ROS:  A comprehensive ROS was completed and negative except as noted per HPI  Allergies  Allergen Reactions   Hydrochlorothiazide Other (See Comments)    Fatigue   Lipitor [Atorvastatin Calcium] Other (See Comments)    Myalgia    Past Medical History:  Diagnosis Date   Essential hypertension, benign 06/29/2011   Hemorrhoids 08/01/2018   Morbid obesity ([place]) 10/12/2018   Neurotic excoriations 07/27/2016   Prediabetes 05/16/2012   Testosterone deficiency 10/12/2018   Tubular adenoma of colon 10/17/2018   [facility].  Plan for 5-year recheck.   Vitamin D deficiency 08/01/2017    Past Surgical History:  Procedure Laterality Date   KIDNEY STONE SURGERY     SHOULDER SURGERY  December 6237   Left   UMBILICAL HERNIA REPAIR      Social History   Socioeconomic History   Marital status: Married    Spouse name: Caren Griffins   Number of children: 2   Years of education: Not on file   Highest education level: Not on file  Occupational History   Occupation: Public librarian    Comment: [employer]  Tobacco Use   Smoking status: Every Day    Packs/day: 2.00    Pack years: 0.00    Types: Cigarettes   Smokeless tobacco: Never  Vaping Use   Vaping Use: Never used  Substance and Sexual Activity   Alcohol use: No   Drug  use: No   Sexual activity: Yes    Partners: Female  Other Topics Concern   Not on file  Social History Narrative   2 caffeinated drinks daily. No regular exercise he does have a physical job.   Social Determinants of Health   Financial Resource Strain: Not on file  Food Insecurity: Not on file  Transportation Needs: Not on file  Physical Activity: Not on file  Stress: Not on file  Social Connections: Not on file    Family History  Problem Relation Age of Onset   Colon cancer Brother    Hypertension Mother    Parkinson's disease Mother    Stroke Brother     Health Maintenance  Topic Date Due   Zoster Vaccines- Shingrix (1 of 2) Never done   COLONOSCOPY (Pts 45-52yrs Insurance coverage will need to be confirmed)  11/29/2018   COVID-19 Vaccine (3 - Pfizer risk series) 03/24/2020   INFLUENZA VACCINE  07/12/2021   PNA vac Low Risk Adult (2 of 2 - PPSV23) 08/01/2022   TETANUS/TDAP  11/15/2022   Hepatitis C Screening  Completed   HPV VACCINES  Aged Out     ----------------------------------------------------------------------------------------------------------------------------------------------------------------------------------------------------------------- Physical Exam BP (!) 152/79 (BP Location: Left Arm, Patient Position: Sitting, Cuff Size: Large)   Pulse 94   Ht  5\' 4"  (1.626 m)   Wt 229 lb (103.9 kg)   SpO2 96%   BMI 39.31 kg/m   Physical Exam Constitutional:      Appearance: Normal appearance.  Eyes:     General: No scleral icterus. Cardiovascular:     Rate and Rhythm: Normal rate and regular rhythm.  Pulmonary:     Effort: Pulmonary effort is normal.     Breath sounds: Normal breath sounds.  Musculoskeletal:     Cervical back: Normal range of motion and neck supple.     Comments: TTP along bilateral greater trochanter.  ROM of hip with some pain on internal rotation.   Neurological:     Mental Status: He is alert.  Psychiatric:        Mood and  Affect: Mood normal.        Behavior: Behavior normal.    ------------------------------------------------------------------------------------------------------------------------------------------------------------------------------------------------------------------- Assessment and Plan  Greater trochanteric bursitis of both hips Given 80 mg of depo medrol today and will continue prednisone 50mg  daily x5 days.  He has had some groin pain as well.  Xrays of hips ordered.   Meds ordered this encounter  Medications   predniSONE (DELTASONE) 50 MG tablet    Sig: Take 1 tab PO daily.    Dispense:  5 tablet    Refill:  0   methylPREDNISolone acetate (DEPO-MEDROL) injection 80 mg    No follow-ups on file.    This visit occurred during the SARS-CoV-2 public health emergency.  Safety protocols were in place, including screening questions prior to the visit, additional usage of staff PPE, and extensive cleaning of exam room while observing appropriate contact time as indicated for disinfecting solutions.

## 2021-06-04 LAB — URINALYSIS, ROUTINE W REFLEX MICROSCOPIC
Bilirubin Urine: NEGATIVE
Glucose, UA: NEGATIVE
Hgb urine dipstick: NEGATIVE
Ketones, ur: NEGATIVE
Leukocytes,Ua: NEGATIVE
Nitrite: NEGATIVE
Protein, ur: NEGATIVE
Specific Gravity, Urine: 1.017 (ref 1.001–1.035)
pH: 6 (ref 5.0–8.0)

## 2021-06-04 LAB — PSA: PSA: 2.67 ng/mL (ref <=4.00)

## 2021-06-04 LAB — C-REACTIVE PROTEIN: CRP: 18.1 mg/L — ABNORMAL HIGH (ref <8.0)

## 2021-06-04 LAB — SEDIMENTATION RATE: Sed Rate: 14 mm/h (ref 0–20)

## 2021-06-07 ENCOUNTER — Telehealth: Payer: Self-pay | Admitting: Family Medicine

## 2021-06-07 NOTE — Telephone Encounter (Signed)
Informed of results.   Rosemarie Ax, MD [facility] 06/07/2021, 12:30 PM

## 2021-06-09 ENCOUNTER — Other Ambulatory Visit: Payer: Self-pay | Admitting: Family Medicine

## 2021-06-09 ENCOUNTER — Encounter: Payer: Self-pay | Admitting: Family Medicine

## 2021-06-09 MED ORDER — CYCLOBENZAPRINE HCL 10 MG PO TABS: 10.0000 mg | ORAL_TABLET | Freq: Three times a day (TID) | ORAL | 0 refills | Status: DC | PRN

## 2021-06-15 ENCOUNTER — Encounter: Payer: Self-pay | Admitting: Family Medicine

## 2021-06-16 ENCOUNTER — Other Ambulatory Visit: Payer: Self-pay | Admitting: Family Medicine

## 2021-06-16 DIAGNOSIS — R29898 Other symptoms and signs involving the musculoskeletal system: Secondary | ICD-10-CM

## 2021-06-16 DIAGNOSIS — G629 Polyneuropathy, unspecified: Secondary | ICD-10-CM

## 2021-06-24 ENCOUNTER — Ambulatory Visit: Payer: Self-pay

## 2021-06-24 ENCOUNTER — Encounter: Payer: Self-pay | Admitting: Family Medicine

## 2021-06-24 ENCOUNTER — Ambulatory Visit (INDEPENDENT_AMBULATORY_CARE_PROVIDER_SITE_OTHER): Payer: Medicare Other | Admitting: Family Medicine

## 2021-06-24 ENCOUNTER — Other Ambulatory Visit: Payer: Self-pay

## 2021-06-24 VITALS — BP 130/82 | Ht 64.0 in | Wt 229.0 lb

## 2021-06-24 DIAGNOSIS — M1711 Unilateral primary osteoarthritis, right knee: Secondary | ICD-10-CM

## 2021-06-24 DIAGNOSIS — M1611 Unilateral primary osteoarthritis, right hip: Secondary | ICD-10-CM

## 2021-06-24 DIAGNOSIS — M1712 Unilateral primary osteoarthritis, left knee: Secondary | ICD-10-CM | POA: Diagnosis not present

## 2021-06-24 MED ORDER — TRIAMCINOLONE ACETONIDE 40 MG/ML IJ SUSP
40.0000 mg | Freq: Once | INTRAMUSCULAR | Status: AC
  Administered 2021-06-24: 40 mg via INTRA_ARTICULAR

## 2021-06-24 NOTE — Progress Notes (Signed)
Orman Matsumura - 70 y.o. male MRN [id number]  Date of birth: 04-24-1951  SUBJECTIVE:  Including CC & ROS.  No chief complaint on file.   Jamail Cullers is a 70 y.o. male that is presenting for acute on chronic left knee pain and right hip pain and gel injection for right knee..  Independent review of his AP pelvis and left and right hip x-ray shows no significant degenerative changes.   Review of Systems See HPI   HISTORY: Past Medical, Surgical, Social, and Family History Reviewed & Updated per EMR.   Pertinent Historical Findings include:  Past Medical History:  Diagnosis Date   Essential hypertension, benign 06/29/2011   Hemorrhoids 08/01/2018   Morbid obesity ([place]) 10/12/2018   Neurotic excoriations 07/27/2016   Prediabetes 05/16/2012   Testosterone deficiency 10/12/2018   Tubular adenoma of colon 10/17/2018   Colonoscopy 2016 at [facility].  Plan for 5-year recheck.   Vitamin D deficiency 08/01/2017    Past Surgical History:  Procedure Laterality Date   KIDNEY STONE SURGERY     SHOULDER SURGERY  December 3474   Left   UMBILICAL HERNIA REPAIR      Family History  Problem Relation Age of Onset   Colon cancer Brother    Hypertension Mother    Parkinson's disease Mother    Stroke Brother     Social History   Socioeconomic History   Marital status: Married    Spouse name: Caren Griffins   Number of children: 2   Years of education: Not on file   Highest education level: Not on file  Occupational History   Occupation: Public librarian    Comment: [employer]  Tobacco Use   Smoking status: Every Day    Packs/day: 2.00    Types: Cigarettes   Smokeless tobacco: Never  Vaping Use   Vaping Use: Never used  Substance and Sexual Activity   Alcohol use: No   Drug use: No   Sexual activity: Yes    Partners: Female  Other Topics Concern   Not on file  Social History Narrative   2 caffeinated drinks daily. No regular exercise he does have a physical  job.   Social Determinants of Health   Financial Resource Strain: Not on file  Food Insecurity: Not on file  Transportation Needs: Not on file  Physical Activity: Not on file  Stress: Not on file  Social Connections: Not on file  Intimate Partner Violence: Not on file     PHYSICAL EXAM:  VS: BP 130/82 (BP Location: Left Arm, Patient Position: Sitting, Cuff Size: Large)   Ht 5\' 4"  (1.626 m)   Wt 229 lb (103.9 kg)   BMI 39.31 kg/m  Physical Exam Gen: NAD, alert, cooperative with exam, well-appearing    Aspiration/Injection Procedure Note Travarus Trudo [birth year]  Procedure: Injection Indications: right knee pain   Procedure Details Consent: Risks of procedure as well as the alternatives and risks of each were explained to the (patient/caregiver).  Consent for procedure obtained. Time Out: Verified patient identification, verified procedure, site/side was marked, verified correct patient position, special equipment/implants available, medications/allergies/relevent history reviewed, required imaging and test results available.  Performed.  The area was cleaned with iodine and alcohol swabs.    The right knee superior lateral suprapatellar pouch was injected using 4 cc's of 1% lidocaine with a 22 1 1/2" needle.  The syringe was switched and a 60 mg/ 3 mL durolane was injected. Ultrasound was used. Images were obtained in  Long views showing the injection.    A sterile dressing was applied.  Patient did tolerate procedure well.   Aspiration/Injection Procedure Note Detrick Dani Mar 13, 1951  Procedure: Injection Indications: Right hip pain  Procedure Details Consent: Risks of procedure as well as the alternatives and risks of each were explained to the (patient/caregiver).  Consent for procedure obtained. Time Out: Verified patient identification, verified procedure, site/side was marked, verified correct patient position, special equipment/implants available,  medications/allergies/relevent history reviewed, required imaging and test results available.  Performed.  The area was cleaned with iodine and alcohol swabs.    The right hip joint was injected using 3 cc 1% lidocaine on a 22-gauge 3-1/2 inch needle.  The sponge was switched to mixture containing 1 cc's of 40 mg Kenalog and 4 cc's of 0.25% bupivacaine was injected.  Ultrasound was used. Images were obtained in long views showing the injection.     A sterile dressing was applied.  Patient did tolerate procedure well.     ASSESSMENT & PLAN:   OA (osteoarthritis) of knee Durolane gel injection today.  Primary osteoarthritis of left knee Still having issues of the left knee.  Seems degenerative in nature with effusion on exam today. -Could consider Zilretta, genicular nerve block or PRP.  Primary osteoarthritis of right hip Has an effusion on exam with irritation of the surrounding area. -Counseled on home exercise therapy and supportive care. -Injection today. -Could consider physical therapy.

## 2021-06-24 NOTE — Patient Instructions (Signed)
Good to see you Please try ice  Please let me know if you want to try physical therapy  Please let me know if you want to try the zilretta   Please send me a message in MyChart with any questions or updates.  Please see me back in 6-8 weeks.   --Dr. Raeford Razor

## 2021-06-24 NOTE — Assessment & Plan Note (Signed)
Durolane gel injection today.

## 2021-06-24 NOTE — Assessment & Plan Note (Signed)
Has an effusion on exam with irritation of the surrounding area. -Counseled on home exercise therapy and supportive care. -Injection today. -Could consider physical therapy.

## 2021-06-24 NOTE — Assessment & Plan Note (Signed)
Still having issues of the left knee.  Seems degenerative in nature with effusion on exam today. -Could consider Zilretta, genicular nerve block or PRP.

## 2021-07-02 ENCOUNTER — Other Ambulatory Visit: Payer: Self-pay | Admitting: Family Medicine

## 2021-07-02 DIAGNOSIS — M1712 Unilateral primary osteoarthritis, left knee: Secondary | ICD-10-CM

## 2021-07-04 MED ORDER — CYCLOBENZAPRINE HCL 10 MG PO TABS: 10.0000 mg | ORAL_TABLET | Freq: Three times a day (TID) | ORAL | 0 refills | Status: DC | PRN

## 2021-07-06 ENCOUNTER — Encounter: Payer: Self-pay | Admitting: Family Medicine

## 2021-07-06 MED ORDER — HYDROCODONE-ACETAMINOPHEN 5-325 MG PO TABS: 1.0000 | ORAL_TABLET | Freq: Three times a day (TID) | ORAL | 0 refills | Status: DC | PRN

## 2021-07-22 ENCOUNTER — Other Ambulatory Visit: Payer: Self-pay | Admitting: Family Medicine

## 2021-07-22 DIAGNOSIS — M1712 Unilateral primary osteoarthritis, left knee: Secondary | ICD-10-CM

## 2021-07-29 ENCOUNTER — Other Ambulatory Visit: Payer: Self-pay

## 2021-07-29 ENCOUNTER — Encounter: Payer: Self-pay | Admitting: Family Medicine

## 2021-07-29 ENCOUNTER — Ambulatory Visit: Payer: Self-pay

## 2021-07-29 ENCOUNTER — Ambulatory Visit (INDEPENDENT_AMBULATORY_CARE_PROVIDER_SITE_OTHER): Payer: Medicare Other | Admitting: Family Medicine

## 2021-07-29 VITALS — BP 160/78 | Ht 64.0 in | Wt 229.0 lb

## 2021-07-29 DIAGNOSIS — M1711 Unilateral primary osteoarthritis, right knee: Secondary | ICD-10-CM

## 2021-07-29 DIAGNOSIS — M25561 Pain in right knee: Secondary | ICD-10-CM | POA: Diagnosis present

## 2021-07-29 DIAGNOSIS — M1712 Unilateral primary osteoarthritis, left knee: Secondary | ICD-10-CM

## 2021-07-29 DIAGNOSIS — M25551 Pain in right hip: Secondary | ICD-10-CM | POA: Diagnosis not present

## 2021-07-29 DIAGNOSIS — M1611 Unilateral primary osteoarthritis, right hip: Secondary | ICD-10-CM

## 2021-07-29 DIAGNOSIS — M25562 Pain in left knee: Secondary | ICD-10-CM | POA: Diagnosis not present

## 2021-07-29 MED ORDER — TRIAMCINOLONE ACETONIDE 40 MG/ML IJ SUSP
20.0000 mg | Freq: Once | INTRAMUSCULAR | Status: AC
  Administered 2021-07-29: 20 mg via INTRA_ARTICULAR

## 2021-07-29 MED ORDER — TRIAMCINOLONE ACETONIDE 40 MG/ML IJ SUSP
40.0000 mg | Freq: Once | INTRAMUSCULAR | Status: AC
Start: 2021-07-29 — End: 2021-07-29
  Administered 2021-07-29: 40 mg via INTRA_ARTICULAR

## 2021-07-29 NOTE — Progress Notes (Signed)
Erik Estrada - 70 y.o. male MRN [id number]  Date of birth: 1951/08/22  SUBJECTIVE:  Including CC & ROS.  No chief complaint on file.   Erik Estrada is a 70 y.o. male that is presenting with acute on chronic left and right knee pain.  Also presenting with acute on chronic right hip pain.  He has trouble with flexion of his hip.  The pain in his knees gets worse when he is up on his feet.    Review of Systems See HPI   HISTORY: Past Medical, Surgical, Social, and Family History Reviewed & Updated per EMR.   Pertinent Historical Findings include:  Past Medical History:  Diagnosis Date   Essential hypertension, benign 06/29/2011   Hemorrhoids 08/01/2018   Morbid obesity ([place]) 10/12/2018   Neurotic excoriations 07/27/2016   Prediabetes 05/16/2012   Testosterone deficiency 10/12/2018   Tubular adenoma of colon 10/17/2018   Colonoscopy [facility].  Plan for 5-year recheck.   Vitamin D deficiency 08/01/2017    Past Surgical History:  Procedure Laterality Date   KIDNEY STONE SURGERY     SHOULDER SURGERY  December [id number]   Left   UMBILICAL HERNIA REPAIR      Family History  Problem Relation Age of Onset   Colon cancer Brother    Hypertension Mother    Parkinson's disease Mother    Stroke Brother     Social History   Socioeconomic History   Marital status: Married    Spouse name: Caren Griffins   Number of children: 2   Years of education: Not on file   Highest education level: Not on file  Occupational History   Occupation: Public librarian    Comment: [employer]  Tobacco Use   Smoking status: Every Day    Packs/day: 2.00    Types: Cigarettes   Smokeless tobacco: Never  Vaping Use   Vaping Use: Never used  Substance and Sexual Activity   Alcohol use: No   Drug use: No   Sexual activity: Yes    Partners: Female  Other Topics Concern   Not on file  Social History Narrative   2 caffeinated drinks daily. No regular exercise he does have a  physical job.   Social Determinants of Health   Financial Resource Strain: Not on file  Food Insecurity: Not on file  Transportation Needs: Not on file  Physical Activity: Not on file  Stress: Not on file  Social Connections: Not on file  Intimate Partner Violence: Not on file     PHYSICAL EXAM:  VS: BP (!) 160/78 (BP Location: Left Arm, Patient Position: Sitting, Cuff Size: Large)   Ht '5\' 4"'$  (1.626 m)   Wt 229 lb (103.9 kg)   BMI 39.31 kg/m  Physical Exam Gen: NAD, alert, cooperative with exam, well-appearing    Aspiration/Injection Procedure Note Erik Estrada 1951-09-20  Procedure: Injection Indications: Right knee pain  Procedure Details Consent: Risks of procedure as well as the alternatives and risks of each were explained to the (patient/caregiver).  Consent for procedure obtained. Time Out: Verified patient identification, verified procedure, site/side was marked, verified correct patient position, special equipment/implants available, medications/allergies/relevent history reviewed, required imaging and test results available.  Performed.  The area was cleaned with iodine and alcohol swabs.    The right knee superior lateral suprapatellar pouch was injected using 3 cc of 1% lidocaine on a 21-gauge 1-1/2 inch needle.  The syringe was switched and a mixture containing 0.5 cc's of  40 mg Kenalog and 4 cc's of 0.25% bupivacaine was injected.  Ultrasound was used. Images were obtained in long views showing the injection.     A sterile dressing was applied.  Patient did tolerate procedure well.  Aspiration/Injection Procedure Note Erik Estrada 1950-12-30  Procedure: Injection Indications: Left knee pain  Procedure Details Consent: Risks of procedure as well as the alternatives and risks of each were explained to the (patient/caregiver).  Consent for procedure obtained. Time Out: Verified patient identification, verified procedure, site/side was marked,  verified correct patient position, special equipment/implants available, medications/allergies/relevent history reviewed, required imaging and test results available.  Performed.  The area was cleaned with iodine and alcohol swabs.    The left knee superior lateral suprapatellar pouch was injected using 3 cc of 1% lidocaine on a 21-gauge 1-1/2 inch needle.  The syringe was switched and a mixture containing 0.5 cc's of 40 mg Kenalog and 4 cc's of 0.25% bupivacaine was injected.  Ultrasound was used. Images were obtained in long views showing the injection.     A sterile dressing was applied.  Patient did tolerate procedure well.   Aspiration/Injection Procedure Note Erik Estrada [birth year]  Procedure: Injection Indications: Right hip pain  Procedure Details Consent: Risks of procedure as well as the alternatives and risks of each were explained to the (patient/caregiver).  Consent for procedure obtained. Time Out: Verified patient identification, verified procedure, site/side was marked, verified correct patient position, special equipment/implants available, medications/allergies/relevent history reviewed, required imaging and test results available.  Performed.  The area was cleaned with iodine and alcohol swabs.    The right hip joint was injected using 3 cc of 1% lidocaine on a 22-gauge 3-1/2 inch needle.  The syringe was switched to mixture containing 1 cc's of 40 mg Kenalog and 4 cc's of 0.25% bupivacaine was injected.  Ultrasound was used. Images were obtained in long views showing the injection.     A sterile dressing was applied.  Patient did tolerate procedure well.    ASSESSMENT & PLAN:   OA (osteoarthritis) of knee Acute on chronic in nature.  Previous MRI has demonstrating significant buckle handle meniscal tear.  He was pain-free for period of time.  We have done gel injection and previous steroid injection. -Counseled on home exercise therapy and supportive  care. -Injection today. -Could consider Zilretta injection versus referral to surgery.  Primary osteoarthritis of left knee Acute on chronic in nature.  Has an effusion on exam today.  Pain more likely related to degenerative changes -Counseled on home exercise therapy and supportive care. -Injection today. -Could consider physical therapy or Zilretta.  Primary osteoarthritis of right hip Has an effusion with degenerative changes. -Counseled on home exercise therapy and supportive care. -Injection today. -Could consider further imaging or physical therapy.

## 2021-07-29 NOTE — Patient Instructions (Signed)
Good to see you   Please send me a message in MyChart with any questions or updates.  Please see me back in.   --Dr. Raeford Razor

## 2021-07-30 NOTE — Assessment & Plan Note (Signed)
Acute on chronic in nature.  Previous MRI has demonstrating significant buckle handle meniscal tear.  He was pain-free for period of time.  We have done gel injection and previous steroid injection. -Counseled on home exercise therapy and supportive care. -Injection today. -Could consider Zilretta injection versus referral to surgery.

## 2021-07-30 NOTE — Assessment & Plan Note (Signed)
Has an effusion with degenerative changes. -Counseled on home exercise therapy and supportive care. -Injection today. -Could consider further imaging or physical therapy.

## 2021-07-30 NOTE — Assessment & Plan Note (Signed)
Acute on chronic in nature.  Has an effusion on exam today.  Pain more likely related to degenerative changes -Counseled on home exercise therapy and supportive care. -Injection today. -Could consider physical therapy or Zilretta.

## 2021-08-15 ENCOUNTER — Other Ambulatory Visit: Payer: Self-pay | Admitting: Family Medicine

## 2021-09-01 ENCOUNTER — Other Ambulatory Visit: Payer: Self-pay

## 2021-09-01 ENCOUNTER — Encounter: Payer: Self-pay | Admitting: Family Medicine

## 2021-09-01 ENCOUNTER — Ambulatory Visit (INDEPENDENT_AMBULATORY_CARE_PROVIDER_SITE_OTHER): Payer: Medicare Other | Admitting: Family Medicine

## 2021-09-01 DIAGNOSIS — M170 Bilateral primary osteoarthritis of knee: Secondary | ICD-10-CM | POA: Diagnosis present

## 2021-09-01 NOTE — Assessment & Plan Note (Signed)
Acute on chronic in nature.  Does get improvement with steroid injection but only lasted about a week. -Counseled on home exercise therapy and supportive care. -Pursue Zilretta injection. -Can consider physical therapy or further imaging of the left knee.

## 2021-09-01 NOTE — Progress Notes (Signed)
  Erik Estrada - 70 y.o. male MRN [id number]  Date of birth: September 28, 1951  SUBJECTIVE:  Including CC & ROS.  No chief complaint on file.   Erik Lemmerman is a 70 y.o. male that is  presenting with acute worsening of his bilateral knee pain. Has tried steroid and gel injections.   Review of Systems See HPI   HISTORY: Past Medical, Surgical, Social, and Family History Reviewed & Updated per EMR.   Pertinent Historical Findings include:  Past Medical History:  Diagnosis Date   Essential hypertension, benign 06/29/2011   Hemorrhoids 08/01/2018   Morbid obesity ([place]) 10/12/2018   Neurotic excoriations 07/27/2016   Prediabetes 05/16/2012   Testosterone deficiency 10/12/2018   Tubular adenoma of colon 10/17/2018   [facility].  Plan for 5-year recheck.   Vitamin D deficiency 08/01/2017    Past Surgical History:  Procedure Laterality Date   KIDNEY STONE SURGERY     SHOULDER SURGERY  December 0962   Left   UMBILICAL HERNIA REPAIR      Family History  Problem Relation Age of Onset   Colon cancer Brother    Hypertension Mother    Parkinson's disease Mother    Stroke Brother     Social History   Socioeconomic History   Marital status: Married    Spouse name: Caren Griffins   Number of children: 2   Years of education: Not on file   Highest education level: Not on file  Occupational History   Occupation: Public librarian    Comment: [employer]  Tobacco Use   Smoking status: Every Day    Packs/day: 2.00    Types: Cigarettes   Smokeless tobacco: Never  Vaping Use   Vaping Use: Never used  Substance and Sexual Activity   Alcohol use: No   Drug use: No   Sexual activity: Yes    Partners: Female  Other Topics Concern   Not on file  Social History Narrative   2 caffeinated drinks daily. No regular exercise he does have a physical job.   Social Determinants of Health   Financial Resource Strain: Not on file  Food Insecurity: Not on file   Transportation Needs: Not on file  Physical Activity: Not on file  Stress: Not on file  Social Connections: Not on file  Intimate Partner Violence: Not on file     PHYSICAL EXAM:  VS: Ht 5\' 4"  (1.626 m)   Wt 229 lb (103.9 kg)   BMI 39.31 kg/m  Physical Exam Gen: NAD, alert, cooperative with exam, well-appearing     ASSESSMENT & PLAN:   OA (osteoarthritis) of knee Acute on chronic in nature.  Does get improvement with steroid injection but only lasted about a week. -Counseled on home exercise therapy and supportive care. -Pursue Zilretta injection. -Can consider physical therapy or further imaging of the left knee.

## 2021-09-07 ENCOUNTER — Other Ambulatory Visit: Payer: Self-pay

## 2021-09-07 ENCOUNTER — Ambulatory Visit (INDEPENDENT_AMBULATORY_CARE_PROVIDER_SITE_OTHER): Payer: Medicare Other | Admitting: Family Medicine

## 2021-09-07 ENCOUNTER — Ambulatory Visit: Payer: Self-pay

## 2021-09-07 ENCOUNTER — Encounter: Payer: Self-pay | Admitting: Family Medicine

## 2021-09-07 VITALS — Ht 64.0 in | Wt 229.0 lb

## 2021-09-07 DIAGNOSIS — M170 Bilateral primary osteoarthritis of knee: Secondary | ICD-10-CM | POA: Diagnosis present

## 2021-09-07 NOTE — Patient Instructions (Signed)
Good to see you  Please use ice as needed  Please send me a message in MyChart with any questions or updates.  Please see me back in 4 weeks.   --Dr. Charidy Cappelletti  

## 2021-09-07 NOTE — Assessment & Plan Note (Signed)
Completed zilretta injections today.  - could consider nerve block

## 2021-09-07 NOTE — Progress Notes (Signed)
Erik Estrada - 70 y.o. male MRN [id number]  Date of birth: 1951/04/15  SUBJECTIVE:  Including CC & ROS.  No chief complaint on file.   Erik Estrada is a 70 y.o. male that is here for Zilretta injection.   Review of Systems See HPI   HISTORY: Past Medical, Surgical, Social, and Family History Reviewed & Updated per EMR.   Pertinent Historical Findings include:  Past Medical History:  Diagnosis Date   Essential hypertension, benign 06/29/2011   Hemorrhoids 08/01/2018   Morbid obesity ([place]) 10/12/2018   Neurotic excoriations 07/27/2016   Prediabetes 05/16/2012   Testosterone deficiency 10/12/2018   Tubular adenoma of colon 10/17/2018   [facility]oscopy [facility].  Plan for 5-year recheck.   Vitamin D deficiency 08/01/2017    Past Surgical History:  Procedure Laterality Date   KIDNEY STONE SURGERY     SHOULDER SURGERY  December 6387   Left   UMBILICAL HERNIA REPAIR      Family History  Problem Relation Age of Onset   Colon cancer Brother    Hypertension Mother    Parkinson's disease Mother    Stroke Brother     Social History   Socioeconomic History   Marital status: Married    Spouse name: Caren Griffins   Number of children: 2   Years of education: Not on file   Highest education level: Not on file  Occupational History   Occupation: Public librarian    Comment: [employer]  Tobacco Use   Smoking status: Every Day    Packs/day: 2.00    Types: Cigarettes   Smokeless tobacco: Never  Vaping Use   Vaping Use: Never used  Substance and Sexual Activity   Alcohol use: No   Drug use: No   Sexual activity: Yes    Partners: Female  Other Topics Concern   Not on file  Social History Narrative   2 caffeinated drinks daily. No regular exercise he does have a physical job.   Social Determinants of Health   Financial Resource Strain: Not on file  Food Insecurity: Not on file  Transportation Needs: Not on file  Physical Activity: Not on file   Stress: Not on file  Social Connections: Not on file  Intimate Partner Violence: Not on file     PHYSICAL EXAM:  VS: Ht 5\' 4"  (1.626 m)   Wt 229 lb (103.9 kg)   BMI 39.31 kg/m  Physical Exam Gen: NAD, alert, cooperative with exam, well-appearing    Aspiration/Injection Procedure Note Erik Estrada [birth year]  Procedure: Injection Indications: right knee pain   Procedure Details Consent: Risks of procedure as well as the alternatives and risks of each were explained to the (patient/caregiver).  Consent for procedure obtained. Time Out: Verified patient identification, verified procedure, site/side was marked, verified correct patient position, special equipment/implants available, medications/allergies/relevent history reviewed, required imaging and test results available.  Performed.  The area was cleaned with iodine and alcohol swabs.    The right knee superior lateral suprapatellar pouch was injected using 3 cc of 1% lidocaine on a 25-gauge 1-1/2 inch needle.  The syringe was switched and a mixture containing 5 cc's of 32 mg Zilretta and 4 cc's of 0.25% bupivacaine was injected.  Ultrasound was used. Images were obtained in long views showing the injection.    A sterile dressing was applied.  Patient did tolerate procedure well.    Aspiration/Injection Procedure Note Erik Estrada 1951/06/27  Procedure: Aspiration and Injection Indications: left knee  pain   Procedure Details Consent: Risks of procedure as well as the alternatives and risks of each were explained to the (patient/caregiver).  Consent for procedure obtained. Time Out: Verified patient identification, verified procedure, site/side was marked, verified correct patient position, special equipment/implants available, medications/allergies/relevent history reviewed, required imaging and test results available.  Performed.  The area was cleaned with iodine and alcohol swabs.    The left knee superior  lateral suprapatellar pouch was injected using 3 cc of 1% lidocaine on a 25-gauge 1-1/2 inch needle.  An 18-gauge 1-1/2 needle was used to achieve aspiration.  The syringe was switched and a mixture containing 5 cc's of 32 mg Zilretta and 4 cc's of 0.25% bupivacaine was injected.  Ultrasound was used. Images were obtained in long views showing the injection.   Amount of Fluid Aspirated:  9mL Character of Fluid: clear and straw colored Fluid was sent for: n/a  A sterile dressing was applied.  Patient did tolerate procedure well.         ASSESSMENT & PLAN:   OA (osteoarthritis) of knee Completed zilretta injections today.  - could consider nerve block

## 2021-09-10 ENCOUNTER — Ambulatory Visit (INDEPENDENT_AMBULATORY_CARE_PROVIDER_SITE_OTHER): Payer: Medicare Other | Admitting: Family Medicine

## 2021-09-10 ENCOUNTER — Encounter: Payer: Self-pay | Admitting: Family Medicine

## 2021-09-10 ENCOUNTER — Other Ambulatory Visit: Payer: Self-pay

## 2021-09-10 DIAGNOSIS — I10 Essential (primary) hypertension: Secondary | ICD-10-CM

## 2021-09-10 MED ORDER — LISINOPRIL 20 MG PO TABS: 20.0000 mg | ORAL_TABLET | Freq: Every day | ORAL | 1 refills | Status: DC

## 2021-09-12 NOTE — Progress Notes (Signed)
Erik Estrada - 70 y.o. male MRN [id number]  Date of birth: 07/30/1951  Subjective Chief Complaint  Patient presents with   Hypertension    HPI Erik Estrada is a 70 year old male here today for follow-up of hypertension.  Since his last visit with me he did have a visit with Dr. Raeford Razor and had injection in his knees.  He reports that these have been quite helpful for managing his chronic pain.  He has been out of lisinopril for few days.  He has done well with this and denies side effects.  He does not check blood pressure at home regularly.  He has not had chest pain, shortness of breath, palpitations, headache or vision changes.  ROS:  A comprehensive ROS was completed and negative except as noted per HPI  Allergies  Allergen Reactions   Hydrochlorothiazide Other (See Comments)    Fatigue   Lipitor [Atorvastatin Calcium] Other (See Comments)    Myalgia    Past Medical History:  Diagnosis Date   Essential hypertension, benign 06/29/2011   Hemorrhoids 08/01/2018   Morbid obesity ([place]) 10/12/2018   Neurotic excoriations 07/27/2016   Prediabetes 05/16/2012   Testosterone deficiency 10/12/2018   Tubular adenoma of colon 10/17/2018   Colonoscopy 2016 at [facility].  Plan for 5-year recheck.   Vitamin D deficiency 08/01/2017    Past Surgical History:  Procedure Laterality Date   KIDNEY STONE SURGERY     SHOULDER SURGERY  December 7654   Left   UMBILICAL HERNIA REPAIR      Social History   Socioeconomic History   Marital status: Married    Spouse name: Erik Estrada   Number of children: 2   Years of education: Not on file   Highest education level: Not on file  Occupational History   Occupation: Public librarian    Comment: [employer]  Tobacco Use   Smoking status: Every Day    Packs/day: 2.00    Types: Cigarettes   Smokeless tobacco: Never  Vaping Use   Vaping Use: Never used  Substance and Sexual Activity   Alcohol use: No   Drug use: No   Sexual activity:  Yes    Partners: Female  Other Topics Concern   Not on file  Social History Narrative   2 caffeinated drinks daily. No regular exercise he does have a physical job.   Social Determinants of Health   Financial Resource Strain: Not on file  Food Insecurity: Not on file  Transportation Needs: Not on file  Physical Activity: Not on file  Stress: Not on file  Social Connections: Not on file    Family History  Problem Relation Age of Onset   Colon cancer Brother    Hypertension Mother    Parkinson's disease Mother    Stroke Brother     Health Maintenance  Topic Date Due   Zoster Vaccines- Shingrix (1 of 2) Never done   COLONOSCOPY (Pts 45-46yrs Insurance coverage will need to be confirmed)  11/29/2018   COVID-19 Vaccine (3 - Pfizer risk series) 03/24/2020   INFLUENZA VACCINE  07/12/2021   TETANUS/TDAP  11/15/2022   Hepatitis C Screening  Completed   HPV VACCINES  Aged Out     ----------------------------------------------------------------------------------------------------------------------------------------------------------------------------------------------------------------- Physical Exam BP (!) 150/81 (BP Location: Left Arm, Patient Position: Sitting, Cuff Size: Large)   Pulse 80   Temp 98.2 F (36.8 C)   Ht 5\' 4"  (1.626 m)   Wt 216 lb (98 kg)   SpO2 98%  BMI 37.08 kg/m   Physical Exam Constitutional:      Appearance: Normal appearance.  HENT:     Head: Normocephalic and atraumatic.  Cardiovascular:     Rate and Rhythm: Normal rate and regular rhythm.  Pulmonary:     Effort: Pulmonary effort is normal.     Breath sounds: Normal breath sounds.  Neurological:     General: No focal deficit present.     Mental Status: He is alert.  Psychiatric:        Mood and Affect: Mood normal.        Behavior: Behavior normal.     ------------------------------------------------------------------------------------------------------------------------------------------------------------------------------------------------------------------- Assessment and Plan  Essential hypertension, benign Blood pressure is elevated today however has been out of lisinopril.  Prescription renewed.  He does have a cuff to check his blood pressure at home and I encouraged him to start checking this.  Low-sodium diet encouraged.   Meds ordered this encounter  Medications   lisinopril (ZESTRIL) 20 MG tablet    Sig: Take 1 tablet (20 mg total) by mouth daily.    Dispense:  90 tablet    Refill:  1    Return in about 6 months (around 03/10/2022) for HTN.    This visit occurred during the SARS-CoV-2 public health emergency.  Safety protocols were in place, including screening questions prior to the visit, additional usage of staff PPE, and extensive cleaning of exam room while observing appropriate contact time as indicated for disinfecting solutions.

## 2021-09-12 NOTE — Assessment & Plan Note (Signed)
Blood pressure is elevated today however has been out of lisinopril.  Prescription renewed.  He does have a cuff to check his blood pressure at home and I encouraged him to start checking this.  Low-sodium diet encouraged.

## 2021-11-03 ENCOUNTER — Other Ambulatory Visit: Payer: Self-pay | Admitting: Family Medicine

## 2021-11-03 MED ORDER — PREDNISONE 20 MG PO TABS: ORAL_TABLET | ORAL | 0 refills | Status: DC

## 2021-11-11 ENCOUNTER — Other Ambulatory Visit: Payer: Self-pay | Admitting: Family Medicine

## 2021-11-11 MED ORDER — PREDNISONE 20 MG PO TABS: ORAL_TABLET | ORAL | 0 refills | Status: DC

## 2021-11-26 ENCOUNTER — Ambulatory Visit: Payer: Self-pay

## 2021-11-26 ENCOUNTER — Ambulatory Visit (INDEPENDENT_AMBULATORY_CARE_PROVIDER_SITE_OTHER): Payer: Medicare Other | Admitting: Family Medicine

## 2021-11-26 ENCOUNTER — Encounter: Payer: Self-pay | Admitting: Family Medicine

## 2021-11-26 VITALS — BP 138/80 | Ht 64.0 in | Wt 216.0 lb

## 2021-11-26 DIAGNOSIS — M170 Bilateral primary osteoarthritis of knee: Secondary | ICD-10-CM | POA: Diagnosis present

## 2021-11-26 DIAGNOSIS — M1611 Unilateral primary osteoarthritis, right hip: Secondary | ICD-10-CM | POA: Diagnosis not present

## 2021-11-26 MED ORDER — KETOROLAC TROMETHAMINE 30 MG/ML IJ SOLN
30.0000 mg | Freq: Once | INTRAMUSCULAR | Status: AC
  Administered 2021-11-26: 30 mg via INTRA_ARTICULAR

## 2021-11-26 NOTE — Patient Instructions (Signed)
Good to see you Please use ice as needed   Please send me a message in MyChart with any questions or updates.  Please see me back in 4-6 weeks.   --Dr. Anaysha Andre  

## 2021-11-26 NOTE — Assessment & Plan Note (Signed)
Acute on chronic in nature.  - counseled on home exercise therapy and supportive care - injection today.

## 2021-11-26 NOTE — Progress Notes (Signed)
Erik Estrada - 70 y.o. male MRN [id number]  Date of birth: [birth year]  SUBJECTIVE:  Including CC & ROS.  No chief complaint on file.   Erik Estrada is a 70 y.o. male that is presenting with acute on chronic bilateral knee pain.  Also presenting with acute on chronic right hip pain.  Pain has been severe recently.  No injuries or falls.   Review of Systems See HPI   HISTORY: Past Medical, Surgical, Social, and Family History Reviewed & Updated per EMR.   Pertinent Historical Findings include:  Past Medical History:  Diagnosis Date   Essential hypertension, benign 06/29/2011   Hemorrhoids 08/01/2018   Morbid obesity ([place]) 10/12/2018   Neurotic excoriations 07/27/2016   Prediabetes 05/16/2012   Testosterone deficiency 10/12/2018   Tubular adenoma of colon 10/17/2018   Colonoscopy 2016 at [facility].  Plan for 5-year recheck.   Vitamin D deficiency 08/01/2017    Past Surgical History:  Procedure Laterality Date   KIDNEY STONE SURGERY     SHOULDER SURGERY  December 6378   Left   UMBILICAL HERNIA REPAIR      Family History  Problem Relation Age of Onset   Colon cancer Brother    Hypertension Mother    Parkinson's disease Mother    Stroke Brother     Social History   Socioeconomic History   Marital status: Married    Spouse name: Caren Griffins   Number of children: 2   Years of education: Not on file   Highest education level: Not on file  Occupational History   Occupation: Public librarian    Comment: [employer]  Tobacco Use   Smoking status: Every Day    Packs/day: 2.00    Types: Cigarettes   Smokeless tobacco: Never  Vaping Use   Vaping Use: Never used  Substance and Sexual Activity   Alcohol use: No   Drug use: No   Sexual activity: Yes    Partners: Female  Other Topics Concern   Not on file  Social History Narrative   2 caffeinated drinks daily. No regular exercise he does have a physical job.   Social Determinants of Health    Financial Resource Strain: Not on file  Food Insecurity: Not on file  Transportation Needs: Not on file  Physical Activity: Not on file  Stress: Not on file  Social Connections: Not on file  Intimate Partner Violence: Not on file     PHYSICAL EXAM:  VS: BP 138/80 (BP Location: Left Arm, Patient Position: Sitting)    Ht 5\' 4"  (1.626 m)    Wt 216 lb (98 kg)    BMI 37.08 kg/m  Physical Exam Gen: NAD, alert, cooperative with exam, well-appearing    Aspiration/Injection Procedure Note Erik Estrada 15-Jun-1951  Procedure: Injection Indications: left knee pain   Procedure Details Consent: Risks of procedure as well as the alternatives and risks of each were explained to the (patient/caregiver).  Consent for procedure obtained. Time Out: Verified patient identification, verified procedure, site/side was marked, verified correct patient position, special equipment/implants available, medications/allergies/relevent history reviewed, required imaging and test results available.  Performed.  The area was cleaned with iodine and alcohol swabs.    The left knee superior lateral suprapatellar pouch was injected using 3 cc of 1% lidocaine on a 25-gauge 1-1/2 inch needle.  The syringe was switched and a mixture containing 5 cc's of 32 mg Zilretta and 4 cc's of 0.25% bupivacaine was injected.  Ultrasound was used. Images  were obtained in long views showing the injection.    A sterile dressing was applied.  Patient did tolerate procedure well.   Aspiration/Injection Procedure Note Erik Estrada [birth year]  Procedure: Injection Indications: right knee pain   Procedure Details Consent: Risks of procedure as well as the alternatives and risks of each were explained to the (patient/caregiver).  Consent for procedure obtained. Time Out: Verified patient identification, verified procedure, site/side was marked, verified correct patient position, special equipment/implants available,  medications/allergies/relevent history reviewed, required imaging and test results available.  Performed.  The area was cleaned with iodine and alcohol swabs.    The right knee superior lateral suprapatellar pouch was injected using 3 cc of 1% lidocaine on a 25-gauge 1-1/2 inch needle.  The syringe was switched and a mixture containing 5 cc's of 32 mg Zilretta and 4 cc's of 0.25% bupivacaine was injected.  Ultrasound was used. Images were obtained in long views showing the injection.    A sterile dressing was applied.  Patient did tolerate procedure well.   Aspiration/Injection Procedure Note Erik Estrada [birth year]  Procedure: Injection Indications: Right hip pain  Procedure Details Consent: Risks of procedure as well as the alternatives and risks of each were explained to the (patient/caregiver).  Consent for procedure obtained. Time Out: Verified patient identification, verified procedure, site/side was marked, verified correct patient position, special equipment/implants available, medications/allergies/relevent history reviewed, required imaging and test results available.  Performed.  The area was cleaned with iodine and alcohol swabs.    The right hip joint was injected using 1 cc's of 30 mg Toradol and 4 cc's of 0.25% bupivacaine with a 22 3 1/2" needle.  Ultrasound was used. Images were obtained in long views showing the injection.     A sterile dressing was applied.  Patient did tolerate procedure well.    ASSESSMENT & PLAN:   OA (osteoarthritis) of knee Acute on chronic in nature.  - counseled on home exercise therapy and supportive care - zilretta injections.  - could consider referral for surgery   Primary osteoarthritis of right hip Acute on chronic in nature.  - counseled on home exercise therapy and supportive care - injection today.

## 2021-11-26 NOTE — Assessment & Plan Note (Signed)
Acute on chronic in nature.  - counseled on home exercise therapy and supportive care - zilretta injections.  - could consider referral for surgery

## 2022-01-11 ENCOUNTER — Other Ambulatory Visit: Payer: Self-pay | Admitting: Family Medicine

## 2022-01-12 MED ORDER — CYCLOBENZAPRINE HCL 10 MG PO TABS: 10.0000 mg | ORAL_TABLET | Freq: Three times a day (TID) | ORAL | 0 refills | Status: DC | PRN

## 2022-02-03 ENCOUNTER — Ambulatory Visit (INDEPENDENT_AMBULATORY_CARE_PROVIDER_SITE_OTHER): Payer: Medicare Other | Admitting: Family Medicine

## 2022-02-03 ENCOUNTER — Encounter: Payer: Self-pay | Admitting: Family Medicine

## 2022-02-03 ENCOUNTER — Other Ambulatory Visit (HOSPITAL_BASED_OUTPATIENT_CLINIC_OR_DEPARTMENT_OTHER): Payer: Self-pay

## 2022-02-03 VITALS — BP 140/80 | Ht 64.0 in | Wt 216.0 lb

## 2022-02-03 DIAGNOSIS — M1611 Unilateral primary osteoarthritis, right hip: Secondary | ICD-10-CM

## 2022-02-03 DIAGNOSIS — M1712 Unilateral primary osteoarthritis, left knee: Secondary | ICD-10-CM

## 2022-02-03 DIAGNOSIS — M7581 Other shoulder lesions, right shoulder: Secondary | ICD-10-CM

## 2022-02-03 DIAGNOSIS — M170 Bilateral primary osteoarthritis of knee: Secondary | ICD-10-CM | POA: Diagnosis not present

## 2022-02-03 MED ORDER — HYDROCODONE-ACETAMINOPHEN 5-325 MG PO TABS
1.0000 | ORAL_TABLET | Freq: Three times a day (TID) | ORAL | 0 refills | Status: DC | PRN
  Filled 2022-02-03: qty 15, 5d supply, fill #0

## 2022-02-03 NOTE — Assessment & Plan Note (Signed)
Acute on chronic in nature.  His left knee is more troubling than his right.  Degenerative changes are appreciated.  We have tried several injections with limited improvement. -Counseled on home exercise therapy and supportive care. -Referral to physical therapy. -Norco. -Referral to orthopedist.

## 2022-02-03 NOTE — Progress Notes (Signed)
°  Erik Estrada - 71 y.o. male MRN [id number]  Date of birth: Jun 06, 1951  SUBJECTIVE:  Including CC & ROS.  No chief complaint on file.   Erik Estrada is a 70 y.o. male that is presenting with acute on chronic right hip pain, left knee pain, right knee pain and acutely right shoulder pain.  His left knee pain is causing him the most pain.  We have tried several injections into the left and right knee as well as the right hip.  His right shoulder is acutely painful.  He does body work on cars.   Review of Systems See HPI   HISTORY: Past Medical, Surgical, Social, and Family History Reviewed & Updated per EMR.   Pertinent Historical Findings include:  Past Medical History:  Diagnosis Date   Essential hypertension, benign 06/29/2011   Hemorrhoids 08/01/2018   Morbid obesity ([place]) 10/12/2018   Neurotic excoriations 07/27/2016   Prediabetes 05/16/2012   Testosterone deficiency 10/12/2018   Tubular adenoma of colon 10/17/2018   Colonoscopy 2016 at [facility].  Plan for 5-year recheck.   Vitamin D deficiency 08/01/2017    Past Surgical History:  Procedure Laterality Date   KIDNEY STONE SURGERY     SHOULDER SURGERY  December 1583   Left   UMBILICAL HERNIA REPAIR       PHYSICAL EXAM:  VS: BP 140/80 (BP Location: Left Arm, Patient Position: Sitting)    Ht 5\' 4"  (1.626 m)    Wt 216 lb (98 kg)    BMI 37.08 kg/m  Physical Exam Gen: NAD, alert, cooperative with exam, well-appearing MSK:  Neurovascularly intact       ASSESSMENT & PLAN:   OA (osteoarthritis) of knee Acute on chronic in nature.  His left knee is more troubling than his right.  Degenerative changes are appreciated.  We have tried several injections with limited improvement. -Counseled on home exercise therapy and supportive care. -Referral to physical therapy. -Norco. -Referral to orthopedist.  Primary osteoarthritis of right hip Acute on chronic in nature.  Pain is occurring along the joint space.   Imaging has revealed degenerative changes previously. -Counseled on home exercise therapy and supportive care. -Referral to orthopedist -Referral to physical therapy.  Rotator cuff tendinitis, right Acutely occurring.  He does do repetitive actions with his body work -Counseled on home exercise therapy and supportive care. -Referral to physical therapy. -Could consider injection or further imaging.

## 2022-02-03 NOTE — Assessment & Plan Note (Signed)
Acutely occurring.  He does do repetitive actions with his body work -Counseled on home exercise therapy and supportive care. -Referral to physical therapy. -Could consider injection or further imaging.

## 2022-02-03 NOTE — Assessment & Plan Note (Signed)
Acute on chronic in nature.  Pain is occurring along the joint space.  Imaging has revealed degenerative changes previously. -Counseled on home exercise therapy and supportive care. -Referral to orthopedist -Referral to physical therapy.

## 2022-02-03 NOTE — Patient Instructions (Signed)
Good to see you Please use ice as needed  Please use voltaren  Please use the pain medicine as needed  We'll try some physical therapy  I have made the referral to the surgeon   Please send me a message in [place] with any questions or updates.  Please see me back in 6-8 weeks.   --Dr. Raeford Razor

## 2022-02-09 ENCOUNTER — Ambulatory Visit: Payer: Medicare Other | Attending: Family Medicine | Admitting: Physical Therapy

## 2022-02-09 ENCOUNTER — Other Ambulatory Visit: Payer: Self-pay

## 2022-02-09 DIAGNOSIS — M6281 Muscle weakness (generalized): Secondary | ICD-10-CM | POA: Diagnosis not present

## 2022-02-09 DIAGNOSIS — G8929 Other chronic pain: Secondary | ICD-10-CM | POA: Diagnosis not present

## 2022-02-09 DIAGNOSIS — M1611 Unilateral primary osteoarthritis, right hip: Secondary | ICD-10-CM | POA: Diagnosis not present

## 2022-02-09 DIAGNOSIS — R293 Abnormal posture: Secondary | ICD-10-CM

## 2022-02-09 DIAGNOSIS — M25651 Stiffness of right hip, not elsewhere classified: Secondary | ICD-10-CM

## 2022-02-09 DIAGNOSIS — M25662 Stiffness of left knee, not elsewhere classified: Secondary | ICD-10-CM

## 2022-02-09 DIAGNOSIS — M25551 Pain in right hip: Secondary | ICD-10-CM

## 2022-02-09 DIAGNOSIS — M170 Bilateral primary osteoarthritis of knee: Secondary | ICD-10-CM | POA: Diagnosis not present

## 2022-02-09 DIAGNOSIS — M1712 Unilateral primary osteoarthritis, left knee: Secondary | ICD-10-CM | POA: Diagnosis not present

## 2022-02-09 DIAGNOSIS — R600 Localized edema: Secondary | ICD-10-CM | POA: Diagnosis not present

## 2022-02-09 DIAGNOSIS — M7581 Other shoulder lesions, right shoulder: Secondary | ICD-10-CM | POA: Diagnosis not present

## 2022-02-09 DIAGNOSIS — M25562 Pain in left knee: Secondary | ICD-10-CM | POA: Diagnosis not present

## 2022-02-09 NOTE — Therapy (Signed)
[place], [place], [address] Phone: [phone]   Fax:  [phone]  Physical Therapy Evaluation  Patient Details  Name: Erik Estrada MRN: [id number] Date of Birth: 05-24-1951 Referring Provider (PT): Rosemarie Ax, MD   Encounter Date: 02/09/2022   PT End of Session - 02/09/22 0935     Visit Number 1    Number of Visits 16    Date for PT Re-Evaluation 04/06/22    Authorization Type Medicare    Progress Note Due on Visit 10    PT Start Time 0936    PT Stop Time 3875    PT Time Calculation (min) 39 min    Activity Tolerance Patient tolerated treatment well    Behavior During Therapy [facility] for tasks assessed/performed             Past Medical History:  Diagnosis Date   Essential hypertension, benign 06/29/2011   Hemorrhoids 08/01/2018   Morbid obesity ([place]) 10/12/2018   Neurotic excoriations 07/27/2016   Prediabetes 05/16/2012   Testosterone deficiency 10/12/2018   Tubular adenoma of colon 10/17/2018   Colonoscopy 2016 at [facility].  Plan for 5-year recheck.   Vitamin D deficiency 08/01/2017    Past Surgical History:  Procedure Laterality Date   KIDNEY STONE SURGERY     SHOULDER SURGERY  December 6433   Left   UMBILICAL HERNIA REPAIR      There were no vitals filed for this visit.    Subjective Assessment - 02/09/22 0941     Subjective Pt reports he has arthritis in knees, hip and lately shoulders on right. L shoulder has been operated on and is doing great. Pt states L knee gets very swollen. Pt notes sharp pain in the medial L knee with weight bearing. 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 Pt states he's been working less hours per week. Pt has returned to the gym and went to the pool. Pt notes he is feeling sore today because of this. Pt does not feel ready for knee replacement. Pt has trialed using a knee brace but has a hard time  putting it on.    Limitations Standing;Walking;House hold activities    How long can you sit comfortably? Not as bad as standing    How long can you stand comfortably? ~15-20 min start hurting (especially L knee and R hip)    How long can you walk comfortably? ~30 yards; if cool and damp will feel worse    Patient Stated Goals Decrease pain    Currently in Pain? Yes    Pain Score 2    At worst 10/10 when standing   Pain Location Knee    Pain Orientation Left    Pain Descriptors / Indicators Sore;Sharp    Pain Type Chronic pain    Pain Onset More than a month ago    Pain Frequency Constant    Aggravating Factors  Prolonged standing    Pain Relieving Factors Sitting, advil    Multiple Pain Sites Yes    Pain Score 0   at worst 10/10 after standing   Pain Location Hip    Pain Orientation Right    Pain Descriptors / Indicators Sharp    Pain Type Chronic pain    Pain Onset More than a month ago    Pain Frequency Constant    Aggravating Factors  Prolonged standing    Pain Relieving Factors  Sitting/resting                [facility] PT Assessment - 02/09/22 0001       Assessment   Medical Diagnosis M17.0 (ICD-10-CM) - Primary osteoarthritis of both knees  M16.11 (ICD-10-CM) - Primary osteoarthritis of right hip  M75.81 (ICD-10-CM) - Rotator cuff tendinitis, right  M17.12 (ICD-10-CM) - Primary osteoarthritis of left knee    Referring Provider (PT) Rosemarie Ax, MD    Onset Date/Surgical Date --   Has worsened ~1 year   Prior Therapy L shoulder      Precautions   Precautions None      Restrictions   Weight Bearing Restrictions No      Balance Screen   Has the patient fallen in the past 6 months No      Prior Function   Vocation Part time employment    Vocation Requirements Works in Environmental manager -- stands on concrete. Pt works 4 days/wk, 3-4 hrs/day.    Leisure Shag dancing      Observation/Other Assessments   Focus on Therapeutic Outcomes (FOTO)  15 (risk adjusted 51);  predicted 45 at visit 16      ROM / Strength   AROM / PROM / Strength AROM;Strength      AROM   Overall AROM Comments Limited R hip AROM in all planes    AROM Assessment Site Knee    Right/Left Knee Right;Left    Right Knee Extension 0    Right Knee Flexion 95    Left Knee Extension -12    Left Knee Flexion 110      Strength   Strength Assessment Site Hip;Knee    Right/Left Hip Right;Left    Right Hip Flexion 3-/5    Right Hip Extension 2+/5    Right Hip ABduction 2+/5    Left Hip Flexion 4/5    Left Hip Extension 3+/5    Left Hip ABduction 3/5    Right/Left Knee Right;Left    Right Knee Flexion 4/5    Right Knee Extension 4/5    Left Knee Flexion 4/5    Left Knee Extension 3+/5      Palpation   Palpation comment TTP L medial knee with L>R edema      Transfers   Comments Requires use of hands      Ambulation/Gait   Ambulation Distance (Feet) 50 Feet    Assistive device None    Gait Pattern Step-through pattern;Abducted - left;Abducted- right;Wide base of support;Lateral hip instability;Antalgic;Left flexed knee in stance;Decreased stance time - right;Decreased stance time - left;Decreased stride length;Decreased step length - right;Decreased step length - left    Ambulation Surface Level;Indoor                        Objective measurements completed on examination: See above findings.                PT Education - 02/09/22 1129     Education Details Exam findings, initial HEP, and POC    Person(s) Educated Patient    Methods Explanation;Demonstration;Tactile cues;Verbal cues    Comprehension Verbalized understanding;Returned demonstration;Verbal cues required;Tactile cues required              PT Short Term Goals - 02/09/22 1139       PT SHORT TERM GOAL #1   Title Pt will be independent with initial HEP    Time 4    Period Weeks  Status New    Target Date 03/09/22      PT SHORT TERM GOAL #2   Title Pt will be able to  flex R hip at least 1" off seat to demo improving R hip ROM    Baseline ~1/4" off plinth    Time 4    Period Weeks    Status New    Target Date 03/09/22      PT SHORT TERM GOAL #3   Title Pt will have improved bilat knee ROM to at least 5 to 115 deg for improved mobility and transfers    Time 4    Period Weeks    Status New    Target Date 03/09/22               PT Long Term Goals - 02/09/22 1141       PT LONG TERM GOAL #1   Title Pt will be independent with final HEP    Time 8    Period Weeks    Status New    Target Date 04/06/22      PT LONG TERM GOAL #2   Title Pt will demo at least 4/5 bilat hip strength for improved single leg stability    Time 8    Period Weeks    Status New    Target Date 04/06/22      PT LONG TERM GOAL #3   Title Pt will be able to amb at least 400' with no a/d with </=2/10 pain    Time 8    Period Weeks    Status New    Target Date 04/06/22      PT LONG TERM GOAL #4   Title Pt will be able to stand at least 15-20 min with </=2/10 pain    Time 8    Period Weeks    Status New    Target Date 04/06/22      PT LONG TERM GOAL #5   Title Pt will have improved FOTO score to at least 45    Baseline 15    Time 8    Period Weeks    Status New    Target Date 04/06/22                    Plan - 02/09/22 1135     Clinical Impression Statement Mr. Magnussen is a 71 y/o M presenting to OPPT due to OA particularly in L knee and R hip. On assessment, pt demos gross R hip weakness and decreased ROM, L knee decreased ROM and weakness, all resulting in reduced ability to tolerate standing. L knee appears more swollen than R. Pt would highly benefit from PT to address these issues and improve his overall function for work and community mobility.    Personal Factors and Comorbidities Age;Fitness;Time since onset of injury/illness/exacerbation;Comorbidity 1    Comorbidities OA bilat knee and R hip; previous L shoulder surgery     Examination-Activity Limitations Locomotion Level;Transfers;Stand;Squat;Lift    Examination-Participation Restrictions Community Activity;Occupation;Yard Work;Shop    Stability/Clinical Decision Making Evolving/Moderate complexity    Clinical Decision Making Moderate    Rehab Potential Good    PT Frequency 2x / week    PT Duration 8 weeks    PT Treatment/Interventions ADLs/Self Care Home Management;Aquatic Therapy;Cryotherapy;Electrical Stimulation;Iontophoresis 4mg /ml Dexamethasone;Moist Heat;DME Instruction;Gait training;Stair training;Functional mobility training;Therapeutic activities;Therapeutic exercise;Balance training;Neuromuscular re-education;Manual techniques;Patient/family education;Passive range of motion;Dry needling;Taping    PT Next Visit Plan Continue to work on hip  and knee ROM. Strengthen hip and knee.    PT Home Exercise Plan [facility]    Consulted and Agree with Plan of Care Patient             Patient will benefit from skilled therapeutic intervention in order to improve the following deficits and impairments:  Abnormal gait, Decreased range of motion, Increased fascial restricitons, Difficulty walking, Decreased endurance, Increased muscle spasms, Decreased activity tolerance, Pain, Decreased balance, Hypomobility, Impaired flexibility, Decreased mobility, Decreased strength, Increased edema, Postural dysfunction  Visit Diagnosis: Chronic pain of left knee  Stiffness of left knee, not elsewhere classified  Pain in right hip  Stiffness of right hip, not elsewhere classified  Abnormal posture  Muscle weakness (generalized)  Localized edema     Problem List Patient Active Problem List   Diagnosis Date Noted   Rotator cuff tendinitis, right 02/03/2022   Primary osteoarthritis of right hip 06/24/2021   Primary osteoarthritis of left knee 03/29/2021   Hamstring strain, left, initial encounter 02/26/2021   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 01/29/2021   OA (osteoarthritis) of knee 08/16/2020   Neoplasm of uncertain behavior of skin 03/12/2020   Tubular adenoma of colon 10/17/2018   Hyperlipidemia 10/12/2018   Greater trochanteric bursitis of both hips 10/12/2018   Morbid obesity ([place]) 10/12/2018   Vitamin D deficiency 08/01/2017   Neurotic excoriations 07/27/2016   Family history of colon cancer requiring screening colonoscopy 05/16/2012   Prediabetes 05/16/2012   Tobacco abuse 07/20/2011   Hypogonadism male 07/20/2011   Essential hypertension, benign 06/29/2011    [facility] April Gordy Levan, PT, DPT 02/09/2022, 1:13 PM  [facility] [place], [place], [address] Phone: [phone]   Fax:  [phone]  Name: Erik Estrada MRN: [id number] Date of Birth: Dec 13, 1950

## 2022-02-09 NOTE — Addendum Note (Signed)
Addended by: Colbert Ewing MARIE L on: 02/09/2022 01:15 PM ? ? Modules accepted: Orders ? ?

## 2022-02-14 ENCOUNTER — Ambulatory Visit: Payer: Medicare Other | Admitting: Physical Therapy

## 2022-02-14 ENCOUNTER — Other Ambulatory Visit: Payer: Self-pay

## 2022-02-14 DIAGNOSIS — G8929 Other chronic pain: Secondary | ICD-10-CM | POA: Diagnosis not present

## 2022-02-14 DIAGNOSIS — M25562 Pain in left knee: Secondary | ICD-10-CM

## 2022-02-14 DIAGNOSIS — M25551 Pain in right hip: Secondary | ICD-10-CM | POA: Diagnosis not present

## 2022-02-14 DIAGNOSIS — R293 Abnormal posture: Secondary | ICD-10-CM | POA: Diagnosis not present

## 2022-02-14 DIAGNOSIS — R600 Localized edema: Secondary | ICD-10-CM

## 2022-02-14 DIAGNOSIS — M25651 Stiffness of right hip, not elsewhere classified: Secondary | ICD-10-CM

## 2022-02-14 DIAGNOSIS — M25662 Stiffness of left knee, not elsewhere classified: Secondary | ICD-10-CM | POA: Diagnosis not present

## 2022-02-14 DIAGNOSIS — M6281 Muscle weakness (generalized): Secondary | ICD-10-CM

## 2022-02-14 NOTE — Therapy (Signed)
[place], [place], [address] Phone: [phone]   Fax:  [phone]  Physical Therapy Treatment  Patient Details  Name: Erik Estrada MRN: [id number] Date of Birth: 29-Jan-1951 Referring Provider (PT): Rosemarie Ax, MD   Encounter Date: 02/14/2022   PT End of Session - 02/14/22 1057     Visit Number 2    Number of Visits 16    Date for PT Re-Evaluation 04/06/22    Authorization Type Medicare    Authorization - Visit Number 2    Progress Note Due on Visit 10    PT Start Time 1057    PT Stop Time 9563    PT Time Calculation (min) 48 min    Activity Tolerance Patient tolerated treatment well    Behavior During Therapy [facility] for tasks assessed/performed             Past Medical History:  Diagnosis Date   Essential hypertension, benign 06/29/2011   Hemorrhoids 08/01/2018   Morbid obesity ([place]) 10/12/2018   Neurotic excoriations 07/27/2016   Prediabetes 05/16/2012   Testosterone deficiency 10/12/2018   Tubular adenoma of colon 10/17/2018   Colonoscopy 2016 at [facility].  Plan for 5-year recheck.   Vitamin D deficiency 08/01/2017    Past Surgical History:  Procedure Laterality Date   KIDNEY STONE SURGERY     SHOULDER SURGERY  December 8756   Left   UMBILICAL HERNIA REPAIR      There were no vitals filed for this visit.   Subjective Assessment - 02/14/22 1101     Subjective Pt states his wife helped him with his exercises. Pt notes his L knee and ankle was swollen yesterday. Pt states he went to the gym and got in the pool.    Limitations Standing;Walking;House hold activities    How long can you sit comfortably? Not as bad as standing    How long can you stand comfortably? ~15-20 min start hurting (especially L knee and R hip)    How long can you walk comfortably? ~30 yards; if cool and damp will feel worse    Patient Stated Goals Decrease pain    Pain Onset More  than a month ago    Pain Onset More than a month ago                [facility] PT Assessment - 02/14/22 0001       Assessment   Medical Diagnosis M17.0 (ICD-10-CM) - Primary osteoarthritis of both knees  M16.11 (ICD-10-CM) - Primary osteoarthritis of right hip  M75.81 (ICD-10-CM) - Rotator cuff tendinitis, right  M17.12 (ICD-10-CM) - Primary osteoarthritis of left knee    Referring Provider (PT) Rosemarie Ax, MD                           [facility] Adult PT Treatment/Exercise - 02/14/22 0001       Ambulation/Gait   Ambulation Distance (Feet) 100 Feet    Assistive device Straight cane    Gait Pattern Step-through pattern;Abducted - left;Abducted- right;Wide base of support;Lateral hip instability;Antalgic;Left flexed knee in stance;Decreased stance time - right;Decreased stance time - left;Decreased stride length;Decreased step length - right;Decreased step length - left    Ambulation Surface Level;Indoor    Gait Comments cues for cane placement and weight negotiation      Knee/Hip Exercises: Stretches   Passive Hamstring Stretch Right;Left;2 reps;30 seconds    Passive  Hamstring Stretch Limitations supine with strap    Other Knee/Hip Stretches butterfly stretch 2x1 min in sup    Other Knee/Hip Stretches L knee stretch into extension with ankle on bolster x2 min      Knee/Hip Exercises: Standing   Extension Limitations Attempted but unable to place full weight on L LE to bring R LE back      Knee/Hip Exercises: Seated   Long Arc Quad Right;Left;2 sets;10 reps    Long Arc Quad Limitations yellow tband      Knee/Hip Exercises: Supine   Quad Sets Strengthening;Right;Left;10 reps    Quad Sets Limitations 5 sec    Heel Slides AAROM;Right;Left;15 reps    Bridges Strengthening;2 sets;10 reps      Knee/Hip Exercises: Sidelying   Clams 2x10 R & L      Manual Therapy   Manual Therapy Joint mobilization    Joint Mobilization L hip LAD 2x30 sec; inferior L hip grade  III joint mobilization, L hip MWM into flexion                       PT Short Term Goals - 02/09/22 1139       PT SHORT TERM GOAL #1   Title Pt will be independent with initial HEP    Time 4    Period Weeks    Status New    Target Date 03/09/22      PT SHORT TERM GOAL #2   Title Pt will be able to flex R hip at least 1" off seat to demo improving R hip ROM    Baseline ~1/4" off plinth    Time 4    Period Weeks    Status New    Target Date 03/09/22      PT SHORT TERM GOAL #3   Title Pt will have improved bilat knee ROM to at least 5 to 115 deg for improved mobility and transfers    Time 4    Period Weeks    Status New    Target Date 03/09/22               PT Long Term Goals - 02/09/22 1141       PT LONG TERM GOAL #1   Title Pt will be independent with final HEP    Time 8    Period Weeks    Status New    Target Date 04/06/22      PT LONG TERM GOAL #2   Title Pt will demo at least 4/5 bilat hip strength for improved single leg stability    Time 8    Period Weeks    Status New    Target Date 04/06/22      PT LONG TERM GOAL #3   Title Pt will be able to amb at least 400' with no a/d with </=2/10 pain    Time 8    Period Weeks    Status New    Target Date 04/06/22      PT LONG TERM GOAL #4   Title Pt will be able to stand at least 15-20 min with </=2/10 pain    Time 8    Period Weeks    Status New    Target Date 04/06/22      PT LONG TERM GOAL #5   Title Pt will have improved FOTO score to at least 45    Baseline 15    Time 8  Period Weeks    Status New    Target Date 04/06/22                   Plan - 02/14/22 1129     Clinical Impression Statement Reviewed pt's HEP with him. Included more hip/knee strengthening exercises for home. Pt is demonstrating increased L knee extension. Focused primarily on quad strengthening and hip abduction. Adjusted pt's HurriCane to proper height and discussed proper gait mechanics with  a/d.    Personal Factors and Comorbidities Age;Fitness;Time since onset of injury/illness/exacerbation;Comorbidity 1    Comorbidities OA bilat knee and R hip; previous L shoulder surgery    Examination-Activity Limitations Locomotion Level;Transfers;Stand;Squat;Lift    Examination-Participation Restrictions Community Activity;Occupation;Yard Work;Shop    Stability/Clinical Decision Making Evolving/Moderate complexity    Rehab Potential Good    PT Frequency 2x / week    PT Duration 8 weeks    PT Treatment/Interventions ADLs/Self Care Home Management;Aquatic Therapy;Cryotherapy;Electrical Stimulation;Iontophoresis '4mg'$ /ml Dexamethasone;Moist Heat;DME Instruction;Gait training;Stair training;Functional mobility training;Therapeutic activities;Therapeutic exercise;Balance training;Neuromuscular re-education;Manual techniques;Patient/family education;Passive range of motion;Dry needling;Taping    PT Next Visit Plan Continue to work on hip and knee ROM. Strengthen hip and knee. Trial leg press and quad eccentric contractions    PT Home Exercise Plan [facility]    Consulted and Agree with Plan of Care Patient             Patient will benefit from skilled therapeutic intervention in order to improve the following deficits and impairments:  Abnormal gait, Decreased range of motion, Increased fascial restricitons, Difficulty walking, Decreased endurance, Increased muscle spasms, Decreased activity tolerance, Pain, Decreased balance, Hypomobility, Impaired flexibility, Decreased mobility, Decreased strength, Increased edema, Postural dysfunction  Visit Diagnosis: Chronic pain of left knee  Stiffness of left knee, not elsewhere classified  Pain in right hip  Stiffness of right hip, not elsewhere classified  Abnormal posture  Muscle weakness (generalized)  Localized edema     Problem List Patient Active Problem List   Diagnosis Date Noted   Rotator cuff tendinitis, right 02/03/2022    Primary osteoarthritis of right hip 06/24/2021   Primary osteoarthritis of left knee 03/29/2021   Hamstring strain, left, initial encounter 02/26/2021   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 01/29/2021   OA (osteoarthritis) of knee 08/16/2020   Neoplasm of uncertain behavior of skin 03/12/2020   Tubular adenoma of colon 10/17/2018   Hyperlipidemia 10/12/2018   Greater trochanteric bursitis of both hips 10/12/2018   Morbid obesity ([place]) 10/12/2018   Vitamin D deficiency 08/01/2017   Neurotic excoriations 07/27/2016   Family history of colon cancer requiring screening colonoscopy 05/16/2012   Prediabetes 05/16/2012   Tobacco abuse 07/20/2011   Hypogonadism male 07/20/2011   Essential hypertension, benign 06/29/2011    [facility] April Gordy Levan, PT, DPT 02/14/2022, 12:36 PM  [facility] [place], [place], [address] Phone: [phone]   Fax:  [phone]  Name: Erik Estrada MRN: [id number] Date of Birth: 1951-03-30

## 2022-02-16 ENCOUNTER — Ambulatory Visit: Payer: Medicare Other | Admitting: Physical Therapy

## 2022-02-16 ENCOUNTER — Other Ambulatory Visit: Payer: Self-pay

## 2022-02-16 DIAGNOSIS — M6281 Muscle weakness (generalized): Secondary | ICD-10-CM

## 2022-02-16 DIAGNOSIS — G8929 Other chronic pain: Secondary | ICD-10-CM | POA: Diagnosis not present

## 2022-02-16 DIAGNOSIS — R293 Abnormal posture: Secondary | ICD-10-CM | POA: Diagnosis not present

## 2022-02-16 DIAGNOSIS — M25651 Stiffness of right hip, not elsewhere classified: Secondary | ICD-10-CM

## 2022-02-16 DIAGNOSIS — M25562 Pain in left knee: Secondary | ICD-10-CM

## 2022-02-16 DIAGNOSIS — M25662 Stiffness of left knee, not elsewhere classified: Secondary | ICD-10-CM

## 2022-02-16 DIAGNOSIS — M25551 Pain in right hip: Secondary | ICD-10-CM

## 2022-02-16 DIAGNOSIS — R600 Localized edema: Secondary | ICD-10-CM

## 2022-02-16 IMAGING — DX DG KNEE COMPLETE 4+V*L*
3 series · 3 of 3 positions shown · non-contrast
Comparison: None.

CLINICAL DATA: Knee pain for several weeks.

EXAM:
LEFT KNEE - COMPLETE 4+ VIEW

[knee ap]
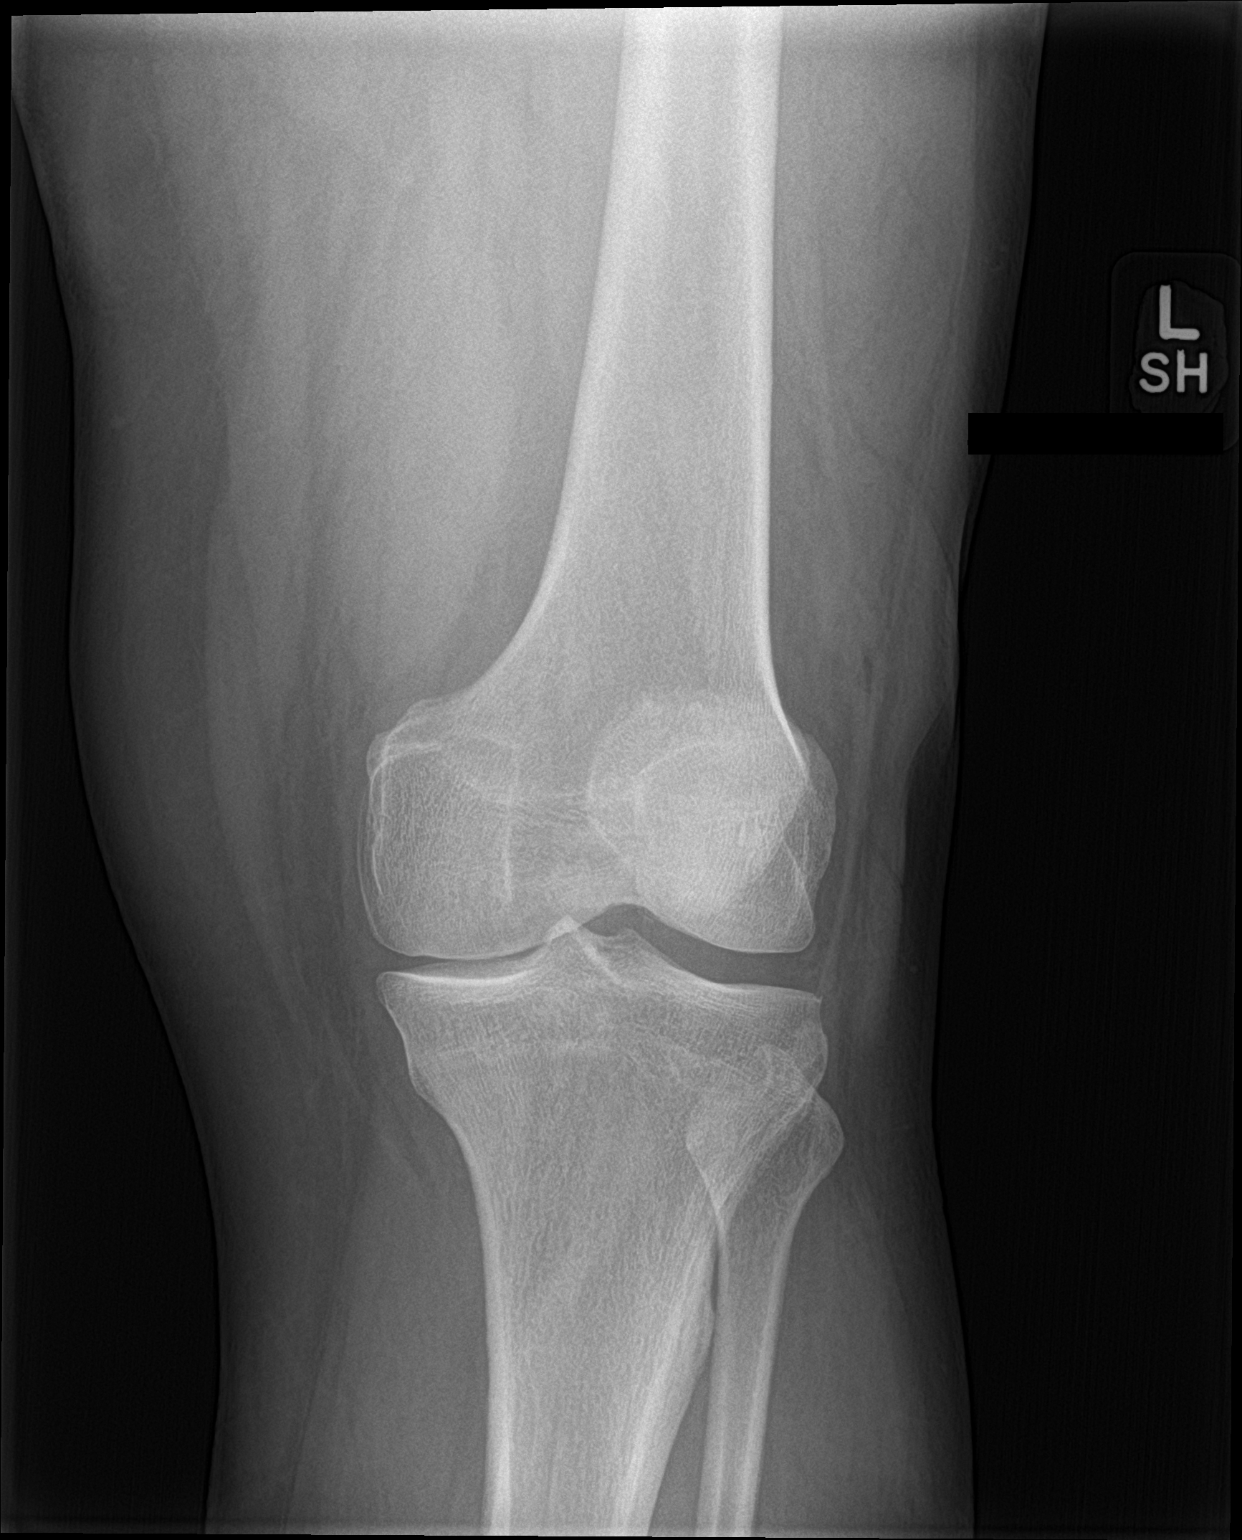

[knee lat]
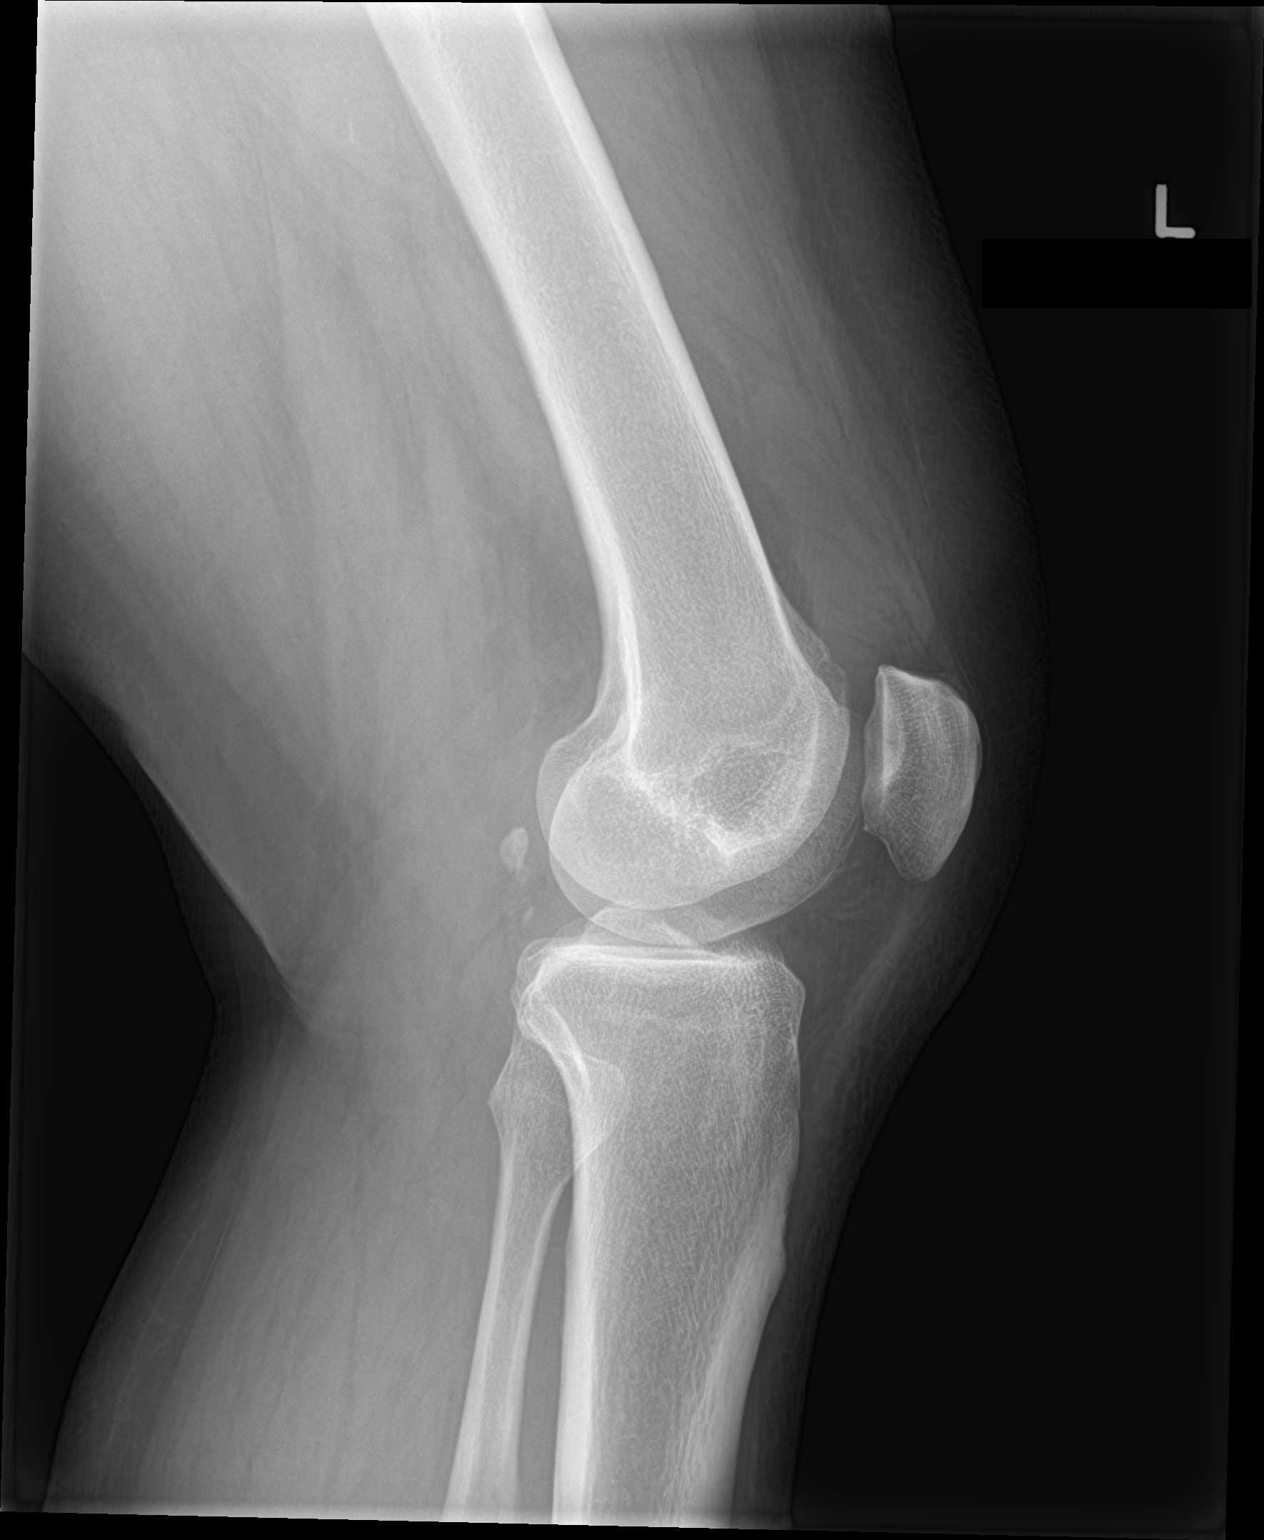

[knee sunrise]
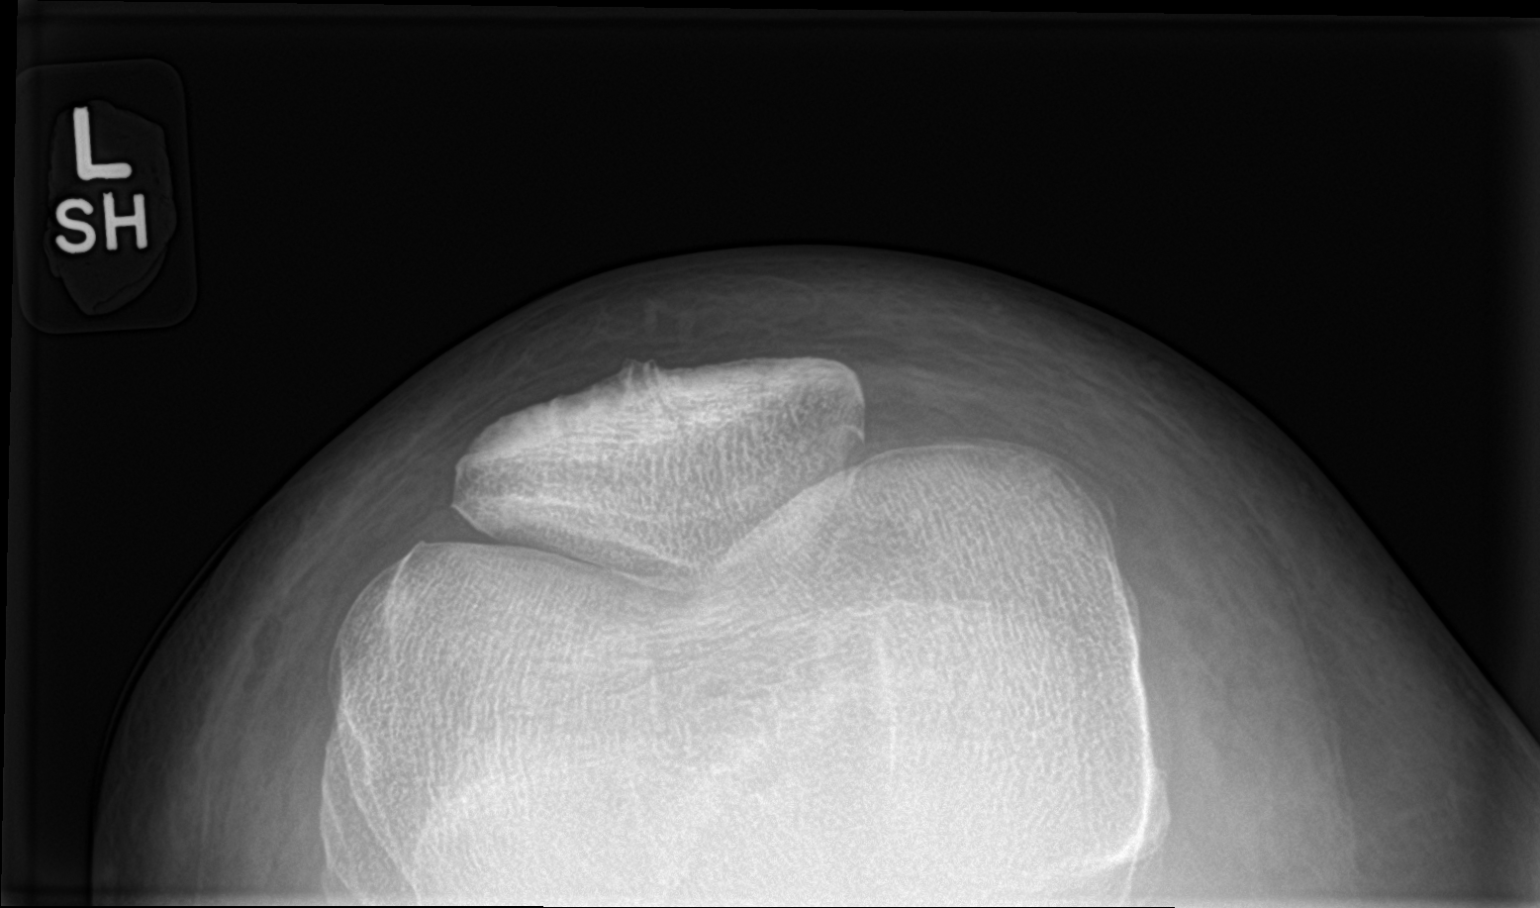

[3 of 3 positions shown; findings below may reference images not displayed]

FINDINGS: Tiny suprapatellar joint effusion. Mild medial and patellofemoral
compartment narrowing. No fracture or dislocation.
IMPRESSION: 1. Tiny suprapatellar joint effusion.
2. Mild osteoarthritis within the medial and patellofemoral
compartments.

## 2022-02-16 NOTE — Therapy (Signed)
[facility], [place], [address] Phone: [phone]   Fax:  [phone]  Physical Therapy Treatment  Patient Details  Name: Erik Estrada MRN: [id number] Date of Birth: October 05, 1951 Referring Provider (PT): Rosemarie Ax, MD   Encounter Date: 02/16/2022   PT End of Session - 02/16/22 1103     Visit Number 3    Number of Visits 16    Date for PT Re-Evaluation 04/06/22    Authorization Type Medicare    Authorization - Visit Number 3    Progress Note Due on Visit 10    PT Start Time 1103    PT Stop Time 1287    PT Time Calculation (min) 39 min    Activity Tolerance Patient tolerated treatment well    Behavior During Therapy [facility] for tasks assessed/performed             Past Medical History:  Diagnosis Date   Essential hypertension, benign 06/29/2011   Hemorrhoids 08/01/2018   Morbid obesity ([place]) 10/12/2018   Neurotic excoriations 07/27/2016   Prediabetes 05/16/2012   Testosterone deficiency 10/12/2018   Tubular adenoma of colon 10/17/2018   Colonoscopy 2016 at [facility] health specialist.  Plan for 5-year recheck.   Vitamin D deficiency 08/01/2017    Past Surgical History:  Procedure Laterality Date   KIDNEY STONE SURGERY     SHOULDER SURGERY  December 8676   Left   UMBILICAL HERNIA REPAIR      There were no vitals filed for this visit.   Subjective Assessment - 02/16/22 1106     Subjective Pt reports his left knee was pulsating/cramping last night and he had to take his medicine. Pt states he did his exercises.    Limitations Standing;Walking;House hold activities    How long can you sit comfortably? Not as bad as standing    How long can you stand comfortably? ~15-20 min start hurting (especially L knee and R hip)    How long can you walk comfortably? ~30 yards; if cool and damp will feel worse    Patient Stated Goals Decrease pain    Currently in Pain? Yes    Pain Score 1      Pain Location Knee    Pain Orientation Left    Pain Descriptors / Indicators Sore    Pain Onset More than a month ago    Pain Onset More than a month ago                [facility] PT Assessment - 02/16/22 0001       Assessment   Medical Diagnosis M17.0 (ICD-10-CM) - Primary osteoarthritis of both knees  M16.11 (ICD-10-CM) - Primary osteoarthritis of right hip  M75.81 (ICD-10-CM) - Rotator cuff tendinitis, right  M17.12 (ICD-10-CM) - Primary osteoarthritis of left knee    Referring Provider (PT) Rosemarie Ax, MD                           [facility] Adult PT Treatment/Exercise - 02/16/22 0001       Knee/Hip Exercises: Stretches   Passive Hamstring Stretch Right;Left;30 seconds    Passive Hamstring Stretch Limitations supine with strap      Knee/Hip Exercises: Aerobic   Nustep L5 x 5 min UEs/LEs      Knee/Hip Exercises: Standing   Other Standing Knee Exercises Mini squat 2x10      Knee/Hip Exercises: Seated   Long Arc  Quad Right;Left;2 sets;10 reps    Long [employer] Limitations yellow tband    Hamstring Curl Strengthening;Right;Left;2 sets;10 reps    Hamstring Limitations yellow tband      Knee/Hip Exercises: Supine   Quad Sets Strengthening;Right;Left;10 reps    Quad Sets Limitations 5 sec    Heel Slides AAROM;Right;Left;10 reps    Bridges Strengthening;2 sets;10 reps      Knee/Hip Exercises: Sidelying   Clams 2x10 R & L yellow tband                       PT Short Term Goals - 02/09/22 1139       PT SHORT TERM GOAL #1   Title Pt will be independent with initial HEP    Time 4    Period Weeks    Status New    Target Date 03/09/22      PT SHORT TERM GOAL #2   Title Pt will be able to flex R hip at least 1" off seat to demo improving R hip ROM    Baseline ~1/4" off plinth    Time 4    Period Weeks    Status New    Target Date 03/09/22      PT SHORT TERM GOAL #3   Title Pt will have improved bilat knee ROM to at least 5 to 115 deg  for improved mobility and transfers    Time 4    Period Weeks    Status New    Target Date 03/09/22               PT Long Term Goals - 02/09/22 1141       PT LONG TERM GOAL #1   Title Pt will be independent with final HEP    Time 8    Period Weeks    Status New    Target Date 04/06/22      PT LONG TERM GOAL #2   Title Pt will demo at least 4/5 bilat hip strength for improved single leg stability    Time 8    Period Weeks    Status New    Target Date 04/06/22      PT LONG TERM GOAL #3   Title Pt will be able to amb at least 400' with no a/d with </=2/10 pain    Time 8    Period Weeks    Status New    Target Date 04/06/22      PT LONG TERM GOAL #4   Title Pt will be able to stand at least 15-20 min with </=2/10 pain    Time 8    Period Weeks    Status New    Target Date 04/06/22      PT LONG TERM GOAL #5   Title Pt will have improved FOTO score to at least 45    Baseline 15    Time 8    Period Weeks    Status New    Target Date 04/06/22                   Plan - 02/16/22 1130     Clinical Impression Statement Progressed pt's HEP as able. Continued to work on improving ROM. Provided standing exercises as pt was able to tolerate.    Personal Factors and Comorbidities Age;Fitness;Time since onset of injury/illness/exacerbation;Comorbidity 1    Comorbidities OA bilat knee and R hip; previous L shoulder surgery  Examination-Activity Limitations Locomotion Level;Transfers;Stand;Squat;Lift    Examination-Participation Restrictions Community Activity;Occupation;Yard Work;Shop    Stability/Clinical Decision Making Evolving/Moderate complexity    Rehab Potential Good    PT Frequency 2x / week    PT Duration 8 weeks    PT Treatment/Interventions ADLs/Self Care Home Management;Aquatic Therapy;Cryotherapy;Electrical Stimulation;Iontophoresis '4mg'$ /ml Dexamethasone;Moist Heat;DME Instruction;Gait training;Stair training;Functional mobility training;Therapeutic  activities;Therapeutic exercise;Balance training;Neuromuscular re-education;Manual techniques;Patient/family education;Passive range of motion;Dry needling;Taping    PT Next Visit Plan Continue to work on hip and knee ROM. Strengthen hip and knee. Trial leg press and quad eccentric contractions    PT Home Exercise Plan [facility]    Consulted and Agree with Plan of Care Patient             Patient will benefit from skilled therapeutic intervention in order to improve the following deficits and impairments:  Abnormal gait, Decreased range of motion, Increased fascial restricitons, Difficulty walking, Decreased endurance, Increased muscle spasms, Decreased activity tolerance, Pain, Decreased balance, Hypomobility, Impaired flexibility, Decreased mobility, Decreased strength, Increased edema, Postural dysfunction  Visit Diagnosis: Chronic pain of left knee  Stiffness of left knee, not elsewhere classified  Pain in right hip  Stiffness of right hip, not elsewhere classified  Abnormal posture  Muscle weakness (generalized)  Localized edema     Problem List Patient Active Problem List   Diagnosis Date Noted   Rotator cuff tendinitis, right 02/03/2022   Primary osteoarthritis of right hip 06/24/2021   Primary osteoarthritis of left knee 03/29/2021   Hamstring strain, left, initial encounter 02/26/2021   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 01/29/2021   OA (osteoarthritis) of knee 08/16/2020   Neoplasm of uncertain behavior of skin 03/12/2020   Tubular adenoma of colon 10/17/2018   Hyperlipidemia 10/12/2018   Greater trochanteric bursitis of both hips 10/12/2018   Morbid obesity ([place]) 10/12/2018   Vitamin D deficiency 08/01/2017   Neurotic excoriations 07/27/2016   Family history of colon cancer requiring screening colonoscopy 05/16/2012   Prediabetes 05/16/2012   Tobacco abuse 07/20/2011   Hypogonadism male 07/20/2011   Essential hypertension,  benign 06/29/2011    [facility] April Gordy Levan, PT, DPT 02/16/2022, 11:43 AM  [facility] [place], [place], [address] Phone: [phone]   Fax:  [phone]  Name: Erik Estrada MRN: [id number] Date of Birth: [birth year]

## 2022-02-21 ENCOUNTER — Ambulatory Visit: Payer: Medicare Other | Admitting: Rehabilitative and Restorative Service Providers"

## 2022-02-21 ENCOUNTER — Encounter: Payer: Self-pay | Admitting: Rehabilitative and Restorative Service Providers"

## 2022-02-21 ENCOUNTER — Other Ambulatory Visit: Payer: Self-pay

## 2022-02-21 DIAGNOSIS — M6281 Muscle weakness (generalized): Secondary | ICD-10-CM

## 2022-02-21 DIAGNOSIS — R600 Localized edema: Secondary | ICD-10-CM

## 2022-02-21 DIAGNOSIS — M25651 Stiffness of right hip, not elsewhere classified: Secondary | ICD-10-CM | POA: Diagnosis not present

## 2022-02-21 DIAGNOSIS — M25662 Stiffness of left knee, not elsewhere classified: Secondary | ICD-10-CM

## 2022-02-21 DIAGNOSIS — M25551 Pain in right hip: Secondary | ICD-10-CM

## 2022-02-21 DIAGNOSIS — G8929 Other chronic pain: Secondary | ICD-10-CM | POA: Diagnosis not present

## 2022-02-21 DIAGNOSIS — R293 Abnormal posture: Secondary | ICD-10-CM

## 2022-02-21 DIAGNOSIS — M25562 Pain in left knee: Secondary | ICD-10-CM | POA: Diagnosis not present

## 2022-02-21 NOTE — Therapy (Signed)
[place], [place], [address] Phone: [phone]   Fax:  [phone]  Physical Therapy Treatment  Patient Details  Name: Erik Estrada MRN: [id number] Date of Birth: December 17, 1950 Referring Provider (PT): Rosemarie Ax, MD   Encounter Date: 02/21/2022   PT End of Session - 02/21/22 1158     Visit Number 4    Number of Visits 16    Date for PT Re-Evaluation 04/06/22    Authorization Type Medicare    Authorization - Visit Number 4    Progress Note Due on Visit 10    PT Start Time 1150    PT Stop Time 8032    PT Time Calculation (min) 44 min    Activity Tolerance Patient tolerated treatment well    Behavior During Therapy [facility] for tasks assessed/performed             Past Medical History:  Diagnosis Date   Essential hypertension, benign 06/29/2011   Hemorrhoids 08/01/2018   Morbid obesity ([place]) 10/12/2018   Neurotic excoriations 07/27/2016   Prediabetes 05/16/2012   Testosterone deficiency 10/12/2018   Tubular adenoma of colon 10/17/2018   Colonoscopy 2016 at digestive health specialist.  Plan for 5-year recheck.   Vitamin D deficiency 08/01/2017    Past Surgical History:  Procedure Laterality Date   KIDNEY STONE SURGERY     SHOULDER SURGERY  December 1224   Left   UMBILICAL HERNIA REPAIR      There were no vitals filed for this visit.   Subjective Assessment - 02/21/22 1156     Subjective The patient reports his L knee is getting worse and the L ankle is also painful and swollen.  Pain is 9.5/10 with weight bearing.    Patient Stated Goals Decrease pain    Currently in Pain? Yes    Pain Score 9     Pain Location Knee    Pain Orientation Left    Pain Descriptors / Indicators Sore    Pain Type Chronic pain    Pain Onset More than a month ago    Pain Frequency Constant    Aggravating Factors  prolonged standing    Pain Relieving Factors rest, medication    Pain Score 0    Pain  Location Hip    Pain Orientation Right    Pain Onset More than a month ago    Pain Frequency Constant    Aggravating Factors  prolonged standing    Pain Relieving Factors sitting/resting                               [facility] Adult PT Treatment/Exercise - 02/21/22 1159       Ambulation/Gait   Ambulation/Gait Yes    Ambulation/Gait Assistance 6: Modified independent (Device/Increase time)    Ambulation Distance (Feet) 160 Feet   100   Assistive device Straight cane    Ambulation Surface Level;Indoor    Gait Comments cues with gait for L heel strike, however patient c/o medial knee pain at initial heel contact      Self-Care   Self-Care Other Self-Care Comments    Other Self-Care Comments  soft tissue mobilization with roller for L thigh and gastroc      Knee/Hip Exercises: Stretches   Passive Hamstring Stretch Right;Left;30 seconds;2 reps    Hip Flexor Stretch Left;30 seconds;1 rep    Gastroc Stretch Left;1 rep;20 seconds  Gastroc Stretch Limitations L calf discomfort with stretching (no significant edema noted)    Other Knee/Hip Stretches L knee stretch into extension with ankle on bolster x2 min adding quad set      Knee/Hip Exercises: Aerobic   Nustep L5 x 4 minutes with LEs only.      Knee/Hip Exercises: Standing   Heel Raises Both;10 reps      Knee/Hip Exercises: Supine   Bridges Strengthening;2 sets;10 reps      Knee/Hip Exercises: Prone   Hamstring Curl 1 set;10 reps    Hamstring Curl Limitations right and left    Hip Extension --    Other Prone Exercises quad set in prone with cues for L knee extension 10 reps R and L      Manual Therapy   Manual Therapy Joint mobilization    Manual therapy comments seated    Joint Mobilization L knee P>A mobs grade II-III in seated with LE supported -- reduces pain                       PT Short Term Goals - 02/09/22 1139       PT SHORT TERM GOAL #1   Title Pt will be independent with  initial HEP    Time 4    Period Weeks    Status New    Target Date 03/09/22      PT SHORT TERM GOAL #2   Title Pt will be able to flex R hip at least 1" off seat to demo improving R hip ROM    Baseline ~1/4" off plinth    Time 4    Period Weeks    Status New    Target Date 03/09/22      PT SHORT TERM GOAL #3   Title Pt will have improved bilat knee ROM to at least 5 to 115 deg for improved mobility and transfers    Time 4    Period Weeks    Status New    Target Date 03/09/22               PT Long Term Goals - 02/09/22 1141       PT LONG TERM GOAL #1   Title Pt will be independent with final HEP    Time 8    Period Weeks    Status New    Target Date 04/06/22      PT LONG TERM GOAL #2   Title Pt will demo at least 4/5 bilat hip strength for improved single leg stability    Time 8    Period Weeks    Status New    Target Date 04/06/22      PT LONG TERM GOAL #3   Title Pt will be able to amb at least 400' with no a/d with </=2/10 pain    Time 8    Period Weeks    Status New    Target Date 04/06/22      PT LONG TERM GOAL #4   Title Pt will be able to stand at least 15-20 min with </=2/10 pain    Time 8    Period Weeks    Status New    Target Date 04/06/22      PT LONG TERM GOAL #5   Title Pt will have improved FOTO score to at least 45    Baseline 15    Time 8    Period Weeks  Status New    Target Date 04/06/22                   Plan - 02/21/22 1159     Clinical Impression Statement The patient has movement patterns that lead to further tightness in L gastroc and L hamstring.  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  PT continuing to progress LE strength, and flexibility to improve mobility.  Pain reduced throughout session.    Personal Factors and Comorbidities Age;Fitness;Time since onset of injury/illness/exacerbation;Comorbidity 1     Comorbidities OA bilat knee and R hip; previous L shoulder surgery    Examination-Activity Limitations Locomotion Level;Transfers;Stand;Squat;Lift    Examination-Participation Restrictions Community Activity;Occupation;Yard Work;Shop    Stability/Clinical Decision Making Evolving/Moderate complexity    Rehab Potential Good    PT Frequency 2x / week    PT Duration 8 weeks    PT Treatment/Interventions ADLs/Self Care Home Management;Aquatic Therapy;Cryotherapy;Electrical Stimulation;Iontophoresis '4mg'$ /ml Dexamethasone;Moist Heat;DME Instruction;Gait training;Stair training;Functional mobility training;Therapeutic activities;Therapeutic exercise;Balance training;Neuromuscular re-education;Manual techniques;Patient/family education;Passive range of motion;Dry needling;Taping    PT Next Visit Plan Continue to work on hip and knee ROM. Strengthen hip and knee. Trial leg press and quad eccentric contractions    PT Home Exercise Plan [facility]    Consulted and Agree with Plan of Care Patient             Patient will benefit from skilled therapeutic intervention in order to improve the following deficits and impairments:  Abnormal gait, Decreased range of motion, Increased fascial restricitons, Difficulty walking, Decreased endurance, Increased muscle spasms, Decreased activity tolerance, Pain, Decreased balance, Hypomobility, Impaired flexibility, Decreased mobility, Decreased strength, Increased edema, Postural dysfunction  Visit Diagnosis: Chronic pain of left knee  Stiffness of left knee, not elsewhere classified  Pain in right hip  Stiffness of right hip, not elsewhere classified  Abnormal posture  Muscle weakness (generalized)  Localized edema     Problem List Patient Active Problem List   Diagnosis Date Noted   Rotator cuff tendinitis, right 02/03/2022   Primary osteoarthritis of right hip 06/24/2021   Primary osteoarthritis of left knee 03/29/2021   Hamstring strain, left,  initial encounter 02/26/2021   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 01/29/2021   OA (osteoarthritis) of knee 08/16/2020   Neoplasm of uncertain behavior of skin 03/12/2020   Tubular adenoma of colon 10/17/2018   Hyperlipidemia 10/12/2018   Greater trochanteric bursitis of both hips 10/12/2018   Morbid obesity ([place]) 10/12/2018   Vitamin D deficiency 08/01/2017   Neurotic excoriations 07/27/2016   Family history of colon cancer requiring screening colonoscopy 05/16/2012   Prediabetes 05/16/2012   Tobacco abuse 07/20/2011   Hypogonadism male 07/20/2011   Essential hypertension, benign 06/29/2011    Rolan Wrightsman, PT 02/21/2022, 1:00 PM  [facility] [place] [address] [place], [place], [address] Phone: [phone]   Fax:  [phone]  Name: Treshun Wold MRN: [id number] Date of Birth: [birth year]

## 2022-02-23 ENCOUNTER — Ambulatory Visit: Payer: Medicare Other | Admitting: Physical Therapy

## 2022-02-23 ENCOUNTER — Other Ambulatory Visit: Payer: Self-pay

## 2022-02-23 DIAGNOSIS — G8929 Other chronic pain: Secondary | ICD-10-CM

## 2022-02-23 DIAGNOSIS — M25562 Pain in left knee: Secondary | ICD-10-CM | POA: Diagnosis not present

## 2022-02-23 DIAGNOSIS — M25651 Stiffness of right hip, not elsewhere classified: Secondary | ICD-10-CM

## 2022-02-23 DIAGNOSIS — M25662 Stiffness of left knee, not elsewhere classified: Secondary | ICD-10-CM

## 2022-02-23 DIAGNOSIS — M6281 Muscle weakness (generalized): Secondary | ICD-10-CM

## 2022-02-23 DIAGNOSIS — M25551 Pain in right hip: Secondary | ICD-10-CM

## 2022-02-23 DIAGNOSIS — R293 Abnormal posture: Secondary | ICD-10-CM

## 2022-02-23 DIAGNOSIS — R600 Localized edema: Secondary | ICD-10-CM

## 2022-02-23 IMAGING — DX DG HIP (WITH OR WITHOUT PELVIS) 2-3V*R*
3 series · 3 of 3 positions shown · non-contrast
Comparison: None.

CLINICAL DATA: Bilateral hip pain for several days

EXAM:
DG HIP (WITH OR WITHOUT PELVIS) 2-3V RIGHT; DG HIP (WITH OR WITHOUT
PELVIS) 2-3V LEFT

[pelvis ap (1 of 2)]
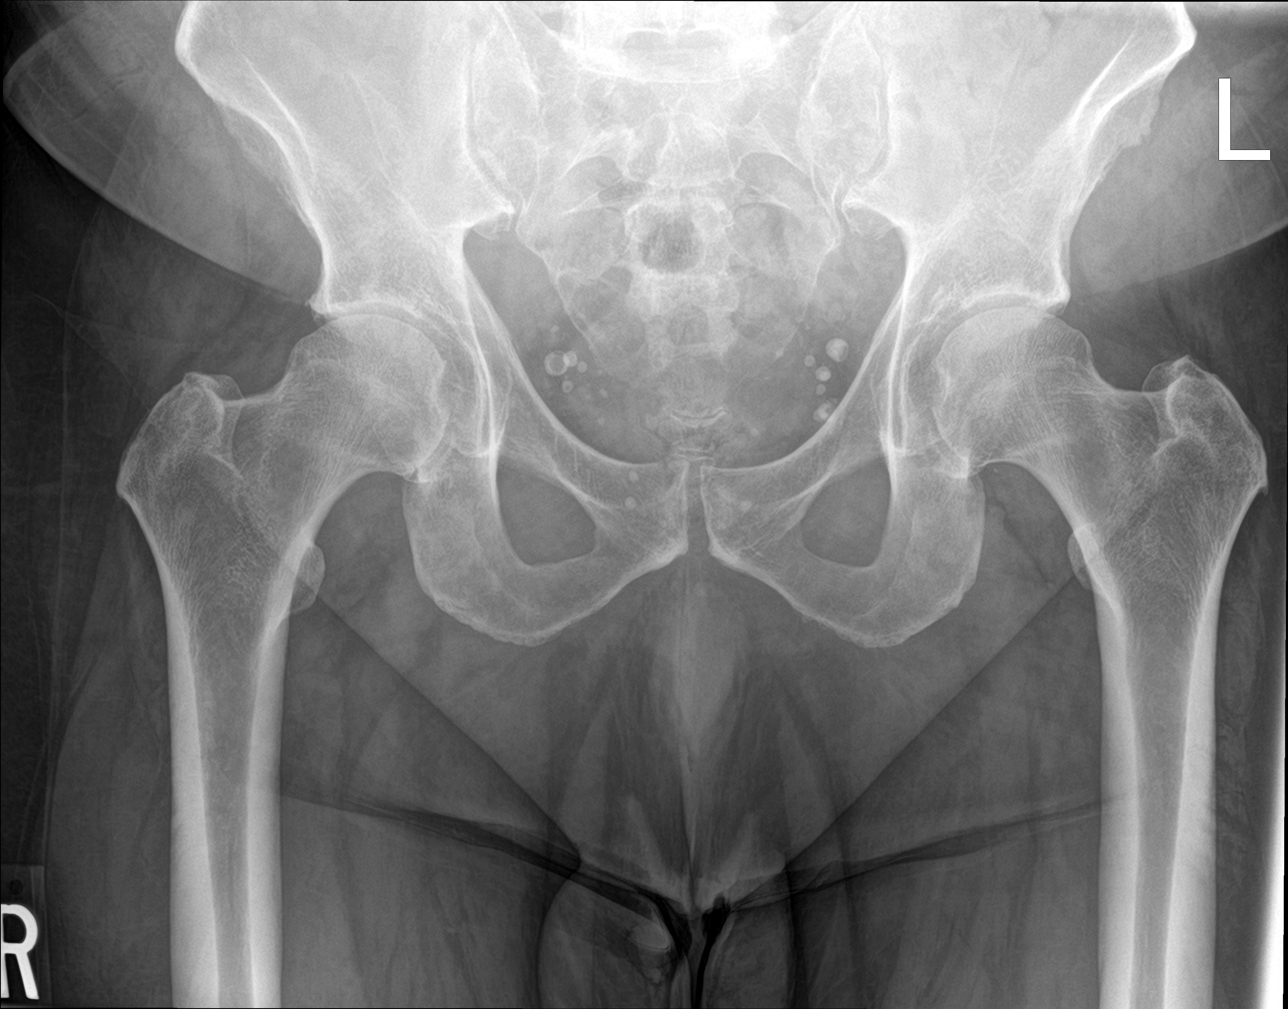

[hip ap]
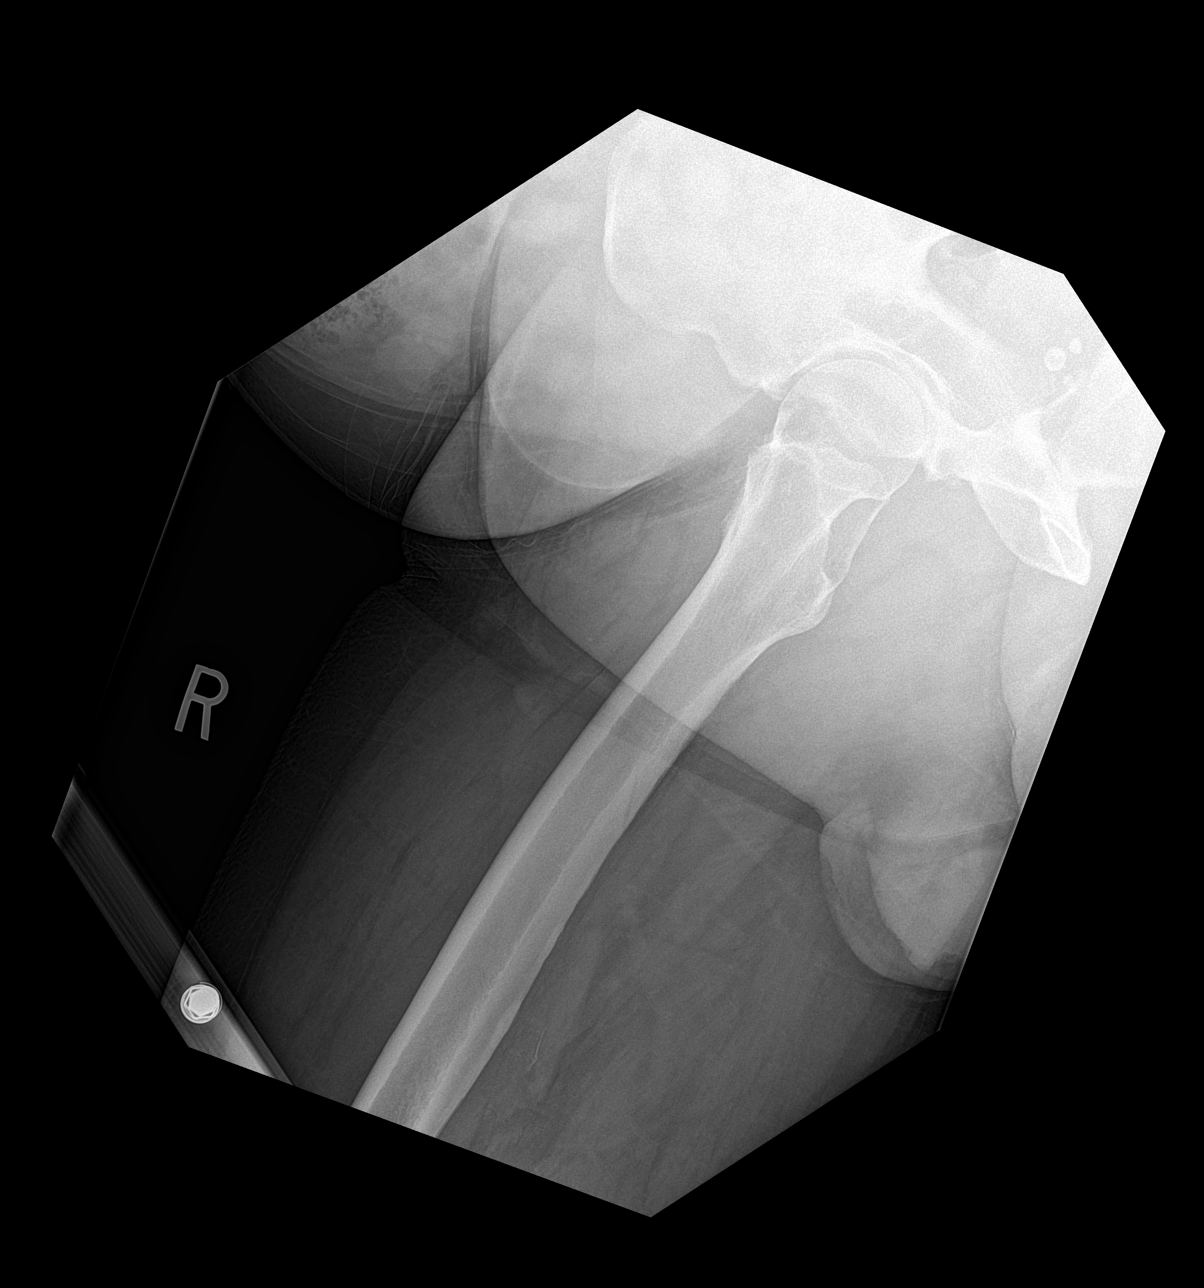

[pelvis ap (2 of 2)]
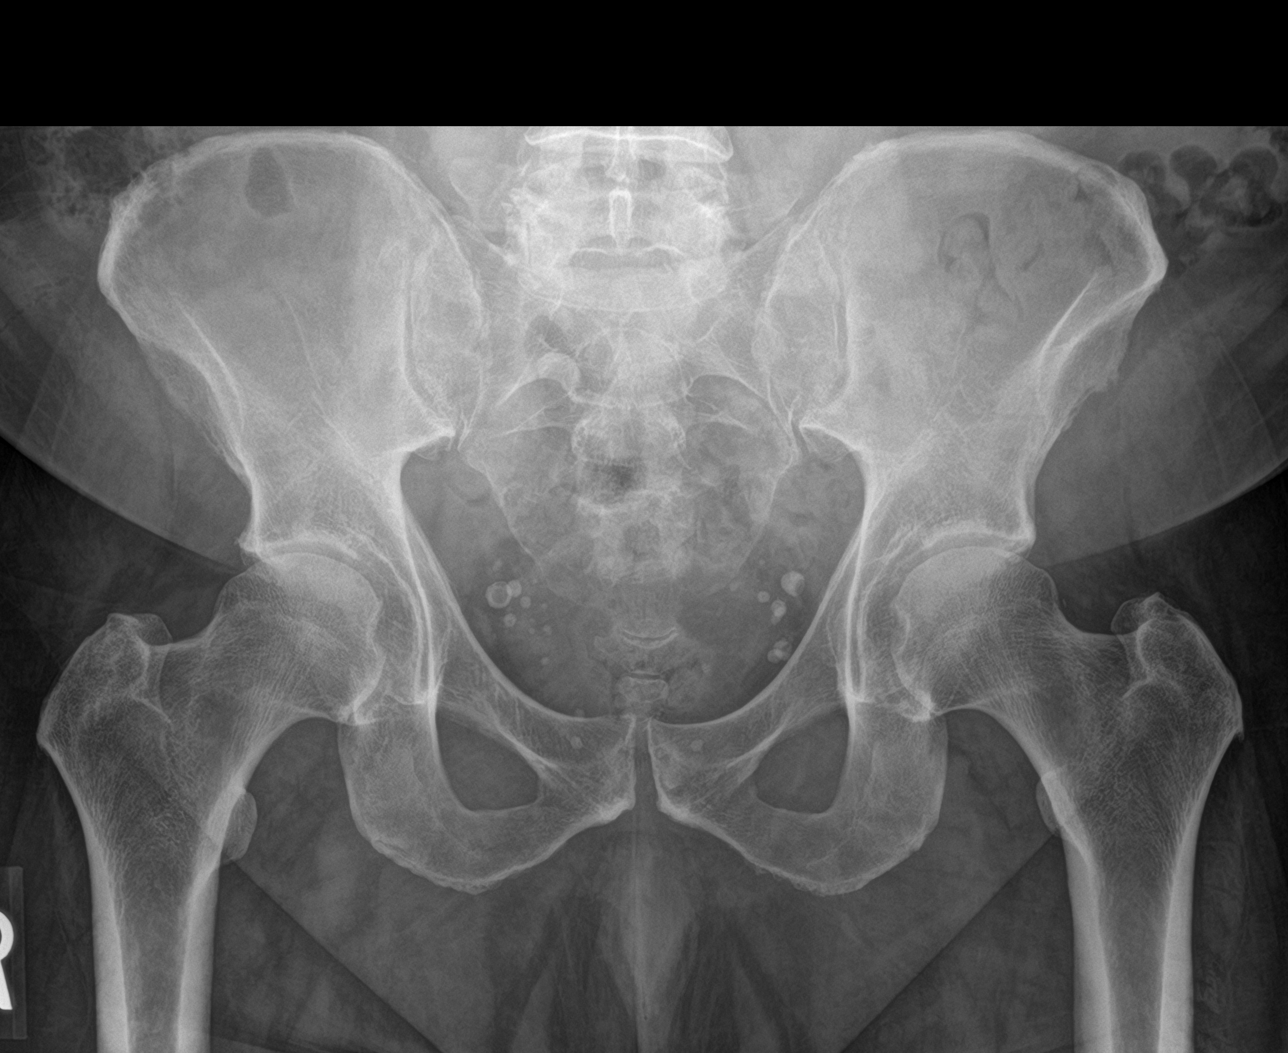

[3 of 3 positions shown; findings below may reference images not displayed]

FINDINGS: Right hip: Frontal view of the pelvis as well as frontal and frogleg
lateral views of the right hip are obtained. No fracture,
subluxation, or dislocation. Mild joint space narrowing and
osteophyte formation compatible with osteoarthritis. Sacroiliac
joint is normal.

Left hip: Frontal view of the pelvis as well as frontal and frogleg
lateral views of the left hip are obtained. No fracture,
subluxation, or dislocation. Minimal osteophyte formation and joint
space narrowing. Sacroiliac joint is normal.

Incidental facet hypertrophic changes at the lumbosacral junction.
IMPRESSION: 1. Mild bilateral hip osteoarthritis, right greater than left. No
acute bony abnormality.
2. Facet hypertrophic changes at the lumbosacral junction.

## 2022-02-23 NOTE — Therapy (Signed)
[facility] [address] [place], [place], [address] Phone: [phone]   Fax:  [phone]  Physical Therapy Treatment  Patient Details  Name: Erik Estrada MRN: [id number] Date of Birth: Jun 02, 1951 Referring Provider (PT): Myra Rude, MD   Encounter Date: 02/23/2022   PT End of Session - 02/23/22 1048     Visit Number 5    Number of Visits 16    Date for PT Re-Evaluation 04/06/22    Authorization Type Medicare    Authorization - Visit Number 5    Progress Note Due on Visit 10    PT Start Time 1050    PT Stop Time 1135    PT Time Calculation (min) 45 min    Activity Tolerance Patient tolerated treatment well    Behavior During Therapy [facility] for tasks assessed/performed             Past Medical History:  Diagnosis Date   Essential hypertension, benign 06/29/2011   Hemorrhoids 08/01/2018   Morbid obesity (HCC) 10/12/2018   Neurotic excoriations 07/27/2016   Prediabetes 05/16/2012   Testosterone deficiency 10/12/2018   Tubular adenoma of colon 10/17/2018   Colonoscopy 2016 at [facility].  Plan for 5-year recheck.   Vitamin D deficiency 08/01/2017    Past Surgical History:  Procedure Laterality Date   KIDNEY STONE SURGERY     SHOULDER SURGERY  December 2010   Left   UMBILICAL HERNIA REPAIR      There were no vitals filed for this visit.   Subjective Assessment - 02/23/22 1052     Subjective Pt states that he iced his L knee and ankle. Pt feels that the weather has been affecting him.    Limitations Standing;Walking;House hold activities    How long can you sit comfortably? Not as bad as standing    How long can you stand comfortably? ~15-20 min start hurting (especially L knee and R hip)    How long can you walk comfortably? ~30 yards; if cool and damp will feel worse    Patient Stated Goals Decrease pain    Currently in Pain? Yes    Pain Score 8    "just depends on how a step"    Pain Location Knee    Pain Orientation Left    Pain Descriptors / Indicators Sore    Pain Onset More than a month ago    Multiple Pain Sites Yes    Pain Score 4    Pain Location Hip    Pain Orientation Right    Pain Descriptors / Indicators Sharp    Pain Type Chronic pain    Pain Onset More than a month ago                [facility] PT Assessment - 02/23/22 0001       Assessment   Medical Diagnosis M17.0 (ICD-10-CM) - Primary osteoarthritis of both knees  M16.11 (ICD-10-CM) - Primary osteoarthritis of right hip  M75.81 (ICD-10-CM) - Rotator cuff tendinitis, right  M17.12 (ICD-10-CM) - Primary osteoarthritis of left knee    Referring Provider (PT) Myra Rude, MD                           [facility] Adult PT Treatment/Exercise - 02/23/22 0001       Knee/Hip Exercises: Stretches   Passive Hamstring Stretch Right;Left;30 seconds;2 reps    Gastroc Stretch Left;1 rep;30 seconds  Gastroc Stretch Limitations L calf discomfort with stretching (no significant edema noted)      Knee/Hip Exercises: Aerobic   Nustep L5 x 5 minutes with LEs only.      Knee/Hip Exercises: Seated   Long Arc Quad AROM;Right;Left;10 reps    Marching Strengthening;10 reps      Knee/Hip Exercises: Supine   Quad Sets Strengthening;Right;Left;10 reps    Short Arc Quad Sets Strengthening;Right;Left;10 reps;2 sets    Short Arc [employer] Limitations lbs    Bridges Strengthening;2 sets;10 reps      Knee/Hip Exercises: Sidelying   Hip ABduction Strengthening;Left;Right;2 sets;10 reps    Hip ABduction Limitations 1 lbs      Knee/Hip Exercises: Prone   Hamstring Curl 2 sets;10 reps    Hamstring Curl Limitations 1 lb                       PT Short Term Goals - 02/23/22 1142       PT SHORT TERM GOAL #1   Title Pt will be independent with initial HEP    Time 4    Period Weeks    Status On-going    Target Date 03/09/22      PT SHORT TERM GOAL #2   Title Pt will be able  to flex R hip at least 1" off seat to demo improving R hip ROM    Baseline ~1/4" off plinth    Time 4    Period Weeks    Status Achieved    Target Date 03/09/22      PT SHORT TERM GOAL #3   Title Pt will have improved bilat knee ROM to at least 5 to 115 deg for improved mobility and transfers    Time 4    Period Weeks    Status On-going    Target Date 03/09/22               PT Long Term Goals - 02/09/22 1141       PT LONG TERM GOAL #1   Title Pt will be independent with final HEP    Time 8    Period Weeks    Status New    Target Date 04/06/22      PT LONG TERM GOAL #2   Title Pt will demo at least 4/5 bilat hip strength for improved single leg stability    Time 8    Period Weeks    Status New    Target Date 04/06/22      PT LONG TERM GOAL #3   Title Pt will be able to amb at least 400' with no a/d with </=2/10 pain    Time 8    Period Weeks    Status New    Target Date 04/06/22      PT LONG TERM GOAL #4   Title Pt will be able to stand at least 15-20 min with </=2/10 pain    Time 8    Period Weeks    Status New    Target Date 04/06/22      PT LONG TERM GOAL #5   Title Pt will have improved FOTO score to at least 45    Baseline 15    Time 8    Period Weeks    Status New    Target Date 04/06/22                   Plan - 02/23/22 1120  Clinical Impression Statement Continued to work on improving hip/knee ROM and strength. Able to tolerate 1 lb weight. Reports easier movement with increased reps. Able to flex hip in seated to meet STG #2.    Personal Factors and Comorbidities Age;Fitness;Time since onset of injury/illness/exacerbation;Comorbidity 1    Comorbidities OA bilat knee and R hip; previous L shoulder surgery    Examination-Activity Limitations Locomotion Level;Transfers;Stand;Squat;Lift    Examination-Participation Restrictions Community Activity;Occupation;Yard Work;Shop    Stability/Clinical Decision Making Evolving/Moderate  complexity    Rehab Potential Good    PT Frequency 2x / week    PT Duration 8 weeks    PT Treatment/Interventions ADLs/Self Care Home Management;Aquatic Therapy;Cryotherapy;Electrical Stimulation;Iontophoresis 4mg /ml Dexamethasone;Moist Heat;DME Instruction;Gait training;Stair training;Functional mobility training;Therapeutic activities;Therapeutic exercise;Balance training;Neuromuscular re-education;Manual techniques;Patient/family education;Passive range of motion;Dry needling;Taping    PT Next Visit Plan Continue to work on hip and knee ROM. Strengthen hip and knee. Trial leg press and quad eccentric contractions    PT Home Exercise Plan [facility]    Consulted and Agree with Plan of Care Patient             Patient will benefit from skilled therapeutic intervention in order to improve the following deficits and impairments:  Abnormal gait, Decreased range of motion, Increased fascial restricitons, Difficulty walking, Decreased endurance, Increased muscle spasms, Decreased activity tolerance, Pain, Decreased balance, Hypomobility, Impaired flexibility, Decreased mobility, Decreased strength, Increased edema, Postural dysfunction  Visit Diagnosis: Chronic pain of left knee  Stiffness of left knee, not elsewhere classified  Pain in right hip  Stiffness of right hip, not elsewhere classified  Abnormal posture  Muscle weakness (generalized)  Localized edema     Problem List Patient Active Problem List   Diagnosis Date Noted   Rotator cuff tendinitis, right 02/03/2022   Primary osteoarthritis of right hip 06/24/2021   Primary osteoarthritis of left knee 03/29/2021   Hamstring strain, left, initial encounter 02/26/2021   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 01/29/2021   OA (osteoarthritis) of knee 08/16/2020   Neoplasm of uncertain behavior of skin 03/12/2020   Tubular adenoma of colon 10/17/2018   Hyperlipidemia 10/12/2018   Greater  trochanteric bursitis of both hips 10/12/2018   Morbid obesity (HCC) 10/12/2018   Vitamin D deficiency 08/01/2017   Neurotic excoriations 07/27/2016   Family history of colon cancer requiring screening colonoscopy 05/16/2012   Prediabetes 05/16/2012   Tobacco abuse 07/20/2011   Hypogonadism male 07/20/2011   Essential hypertension, benign 06/29/2011    [facility] April Dell Ponto, PT, DPT 02/23/2022, 11:42 AM  [facility] [address] [place], [place], [address] Phone: [phone]   Fax:  [phone]  Name: Tiwan Moschella MRN: [id number] Date of Birth: 03-Nov-1951

## 2022-02-28 ENCOUNTER — Encounter: Payer: Medicare Other | Admitting: Physical Therapy

## 2022-03-02 ENCOUNTER — Ambulatory Visit: Payer: Medicare Other | Admitting: Physical Therapy

## 2022-03-02 ENCOUNTER — Other Ambulatory Visit: Payer: Self-pay

## 2022-03-02 DIAGNOSIS — R600 Localized edema: Secondary | ICD-10-CM

## 2022-03-02 DIAGNOSIS — G8929 Other chronic pain: Secondary | ICD-10-CM

## 2022-03-02 DIAGNOSIS — M25562 Pain in left knee: Secondary | ICD-10-CM | POA: Diagnosis not present

## 2022-03-02 DIAGNOSIS — M25651 Stiffness of right hip, not elsewhere classified: Secondary | ICD-10-CM

## 2022-03-02 DIAGNOSIS — M25551 Pain in right hip: Secondary | ICD-10-CM

## 2022-03-02 DIAGNOSIS — R293 Abnormal posture: Secondary | ICD-10-CM | POA: Diagnosis not present

## 2022-03-02 DIAGNOSIS — M25662 Stiffness of left knee, not elsewhere classified: Secondary | ICD-10-CM | POA: Diagnosis not present

## 2022-03-02 DIAGNOSIS — M6281 Muscle weakness (generalized): Secondary | ICD-10-CM

## 2022-03-02 NOTE — Therapy (Signed)
[facility] ?[place], [place], [address] ?Phone: [phone]   Fax:  [phone] ? ?Physical Therapy Treatment ? ?Patient Details  ?Name: Erik Estrada ?MRN: [id number] ?Date of Birth: 04/15/1951 ?Referring Provider (PT): Rosemarie Ax, MD ? ? ?Encounter Date: 03/02/2022 ? ? PT End of Session - 03/02/22 1133   ? ? Visit Number 6   ? Number of Visits 16   ? Date for PT Re-Evaluation 04/06/22   ? Authorization Type Medicare   ? Authorization - Visit Number 6   ? Progress Note Due on Visit 10   ? PT Start Time 1105   ? PT Stop Time 1145   ? PT Time Calculation (min) 40 min   ? Activity Tolerance Patient tolerated treatment well   ? Behavior During Therapy [facility] for tasks assessed/performed   ? ?  ?  ? ?  ? ? ?Past Medical History:  ?Diagnosis Date  ? Essential hypertension, benign 06/29/2011  ? Hemorrhoids 08/01/2018  ? Morbid obesity ([place]) 10/12/2018  ? 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 Prediabetes 05/16/2012  ? Testosterone deficiency 10/12/2018  ? Tubular adenoma of colon 10/17/2018  ? Colonoscopy 2016 at [facility] health specialist.  Plan for 5-year recheck.  ? Vitamin D deficiency 08/01/2017  ? ? ?Past Surgical History:  ?Procedure Laterality Date  ? KIDNEY STONE SURGERY    ? SHOULDER SURGERY  December 2010  ? Left  ? UMBILICAL HERNIA REPAIR    ? ? ?There were no vitals filed for this visit. ? ? Subjective Assessment - 03/02/22 1107   ? ? Subjective Pt reports L knee started acting up yesterday and is a little swollen in the left leg today. Pt reports he was to see surgeon on Monday but had to cancel due to work.   ? Limitations Standing;Walking;House hold activities   ? How long can you sit comfortably? Not as bad as standing   ? How long can you stand comfortably? ~15-20 min start hurting (especially L knee and R hip)   ? How long can you walk comfortably? ~30 yards; if cool and damp will feel worse   ? Patient Stated Goals Decrease pain   ?  Currently in Pain? Yes   ? Pain Score 8    ? Pain Location Knee   ? Pain Orientation Left   ? Pain Descriptors / Indicators Sore   ? Pain Type Chronic pain   ? Pain Onset More than a month ago   ? Pain Onset More than a month ago   ? ?  ?  ? ?  ? ? ? ? ? [facility] PT Assessment - 03/02/22 0001   ? ?  ? Assessment  ? Medical Diagnosis M17.0 (ICD-10-CM) - Primary osteoarthritis of both knees  M16.11 (ICD-10-CM) - Primary osteoarthritis of right hip  M75.81 (ICD-10-CM) - Rotator cuff tendinitis, right  M17.12 (ICD-10-CM) - Primary osteoarthritis of left knee   ? Referring Provider (PT) Rosemarie Ax, MD   ? ?  ?  ? ?  ? ? ? ? ? ? ? ? ? ? ? ? ? ? ? ? [facility] Adult PT Treatment/Exercise - 03/02/22 0001   ? ?  ? Knee/Hip Exercises: Seated  ? Long [employer] Strengthening;Right;Left;1 set;10 reps   ? Long [employer] Limitations red tband   ?  ? Knee/Hip Exercises: Supine  ? Quad Sets Strengthening;Right;Left;10 reps   ? Short Arc [employer] Strengthening;Right;Left;10 reps;2 sets   ?  Short Arc [employer] Limitations 2 lbs   ? Heel Slides AAROM;Right;Left;10 reps   ? Straight Leg Raises Strengthening;Left;10 reps   ? Straight Leg Raises Limitations short range only   ? Patellar Mobs in all directions on L   ?  ? Knee/Hip Exercises: Sidelying  ? Hip ABduction Strengthening;Left;Right;2 sets;10 reps   ? Hip ABduction Limitations 2 lbs   ?  ? Knee/Hip Exercises: Prone  ? Hamstring Curl 2 sets;10 reps   ? Hamstring Curl Limitations 2 lbs   ? Hip Extension Strengthening;Right;Left;2 sets;10 reps   ?  ? Manual Therapy  ? Manual therapy comments R hip/knee flexion/ext PROM in supine; gentle L patellar mobilization in all directions, STM quads   ? ?  ?  ? ?  ? ? ? ? ? ? ? ? ? ? ? ? PT Short Term Goals - 02/23/22 1142   ? ?  ? PT SHORT TERM GOAL #1  ? Title Pt will be independent with initial HEP   ? Time 4   ? Period Weeks   ? Status On-going   ? Target Date 03/09/22   ?  ? PT SHORT TERM GOAL #2  ? Title Pt will be able to flex R hip at  least 1" off seat to demo improving R hip ROM   ? Baseline ~1/4" off plinth   ? Time 4   ? Period Weeks   ? Status Achieved   ? Target Date 03/09/22   ?  ? PT SHORT TERM GOAL #3  ? Title Pt will have improved bilat knee ROM to at least 5 to 115 deg for improved mobility and transfers   ? Time 4   ? Period Weeks   ? Status On-going   ? Target Date 03/09/22   ? ?  ?  ? ?  ? ? ? ? PT Long Term Goals - 02/09/22 1141   ? ?  ? PT LONG TERM GOAL #1  ? Title Pt will be independent with final HEP   ? Time 8   ? Period Weeks   ? Status New   ? Target Date 04/06/22   ?  ? PT LONG TERM GOAL #2  ? Title Pt will demo at least 4/5 bilat hip strength for improved single leg stability   ? Time 8   ? Period Weeks   ? Status New   ? Target Date 04/06/22   ?  ? PT LONG TERM GOAL #3  ? Title Pt will be able to amb at least 400' with no a/d with </=2/10 pain   ? Time 8   ? Period Weeks   ? Status New   ? Target Date 04/06/22   ?  ? PT LONG TERM GOAL #4  ? Title Pt will be able to stand at least 15-20 min with </=2/10 pain   ? Time 8   ? Period Weeks   ? Status New   ? Target Date 04/06/22   ?  ? PT LONG TERM GOAL #5  ? Title Pt will have improved FOTO score to at least 45   ? Baseline 15   ? Time 8   ? Period Weeks   ? Status New   ? Target Date 04/06/22   ? ?  ?  ? ?  ? ? ? ? ? ? ? ? Plan - 03/02/22 1123   ? ? Clinical Impression Statement Increased pt to 1 lb leg weight.  Continued to work on knee/hip ROM and strength. Discussed for pt to wear compression sock due to his L LE swelling with increased time at work. Pt able to tolerate prone knee extension against gravity this session. Progressed pt to using red tband at home.   ? Personal Factors and Comorbidities Age;Fitness;Time since onset of injury/illness/exacerbation;Comorbidity 1   ? Comorbidities OA bilat knee and R hip; previous L shoulder surgery   ? Examination-Activity Limitations Locomotion Level;Transfers;Stand;Squat;Lift   ? Examination-Participation Restrictions Community  Activity;Occupation;Yard Work;Shop   ? Stability/Clinical Decision Making Evolving/Moderate complexity   ? Rehab Potential Good   ? PT Frequency 2x / week   ? PT Duration 8 weeks   ? PT Treatment/Interventions ADLs/Self Care Home Management;Aquatic Therapy;Cryotherapy;Electrical Stimulation;Iontophoresis '4mg'$ /ml Dexamethasone;Moist Heat;DME Instruction;Gait training;Stair training;Functional mobility training;Therapeutic activities;Therapeutic exercise;Balance training;Neuromuscular re-education;Manual techniques;Patient/family education;Passive range of motion;Dry needling;Taping   ? PT Next Visit Plan Continue to work on hip and knee ROM. Strengthen hip and knee. Trial leg press and quad eccentric contractions   ? PT Home Exercise Plan [facility]   ? Consulted and Agree with Plan of Care Patient   ? ?  ?  ? ?  ? ? ?Patient will benefit from skilled therapeutic intervention in order to improve the following deficits and impairments:  Abnormal gait, Decreased range of motion, Increased fascial restricitons, Difficulty walking, Decreased endurance, Increased muscle spasms, Decreased activity tolerance, Pain, Decreased balance, Hypomobility, Impaired flexibility, Decreased mobility, Decreased strength, Increased edema, Postural dysfunction ? ?Visit Diagnosis: ?Chronic pain of left knee ? ?Stiffness of left knee, not elsewhere classified ? ?Pain in right hip ? ?Stiffness of right hip, not elsewhere classified ? ?Abnormal posture ? ?Muscle weakness (generalized) ? ?Localized edema ? ? ? ? ?Problem List ?Patient Active Problem List  ? Diagnosis Date Noted  ? Rotator cuff tendinitis, right 02/03/2022  ? Primary osteoarthritis of right hip 06/24/2021  ? Primary osteoarthritis of left knee 03/29/2021  ? Hamstring strain, left, initial encounter 02/26/2021  ? 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 OA (osteoarthritis) of knee 08/16/2020  ? Neoplasm of uncertain behavior of skin  03/12/2020  ? Tubular adenoma of colon 10/17/2018  ? Hyperlipidemia 10/12/2018  ? Greater trochanteric bursitis of both hips 10/12/2018  ? Morbid obesity ([place]) 10/12/2018  ? Vitamin D deficiency 08/01/2017  ? Neuro

## 2022-03-07 ENCOUNTER — Ambulatory Visit: Payer: Medicare Other | Admitting: Physical Therapy

## 2022-03-07 ENCOUNTER — Other Ambulatory Visit: Payer: Self-pay

## 2022-03-07 DIAGNOSIS — M25551 Pain in right hip: Secondary | ICD-10-CM | POA: Diagnosis not present

## 2022-03-07 DIAGNOSIS — M25651 Stiffness of right hip, not elsewhere classified: Secondary | ICD-10-CM | POA: Diagnosis not present

## 2022-03-07 DIAGNOSIS — G8929 Other chronic pain: Secondary | ICD-10-CM | POA: Diagnosis not present

## 2022-03-07 DIAGNOSIS — M25662 Stiffness of left knee, not elsewhere classified: Secondary | ICD-10-CM | POA: Diagnosis not present

## 2022-03-07 DIAGNOSIS — R293 Abnormal posture: Secondary | ICD-10-CM

## 2022-03-07 DIAGNOSIS — M25562 Pain in left knee: Secondary | ICD-10-CM | POA: Diagnosis not present

## 2022-03-07 DIAGNOSIS — M6281 Muscle weakness (generalized): Secondary | ICD-10-CM

## 2022-03-07 DIAGNOSIS — R600 Localized edema: Secondary | ICD-10-CM

## 2022-03-07 NOTE — Therapy (Signed)
?[facility] ?[place], [place], [address] ?Phone: [phone]   Fax:  [phone] ? ?Physical Therapy Treatment ? ?Patient Details  ?Name: Erik Estrada ?MRN: [id number] ?Date of Birth: May 14, 1951 ?Referring Provider (PT): Rosemarie Ax, MD ? ? ?Encounter Date: 03/07/2022 ? ? PT End of Session - 03/07/22 1108   ? ? Visit Number 7   ? Number of Visits 16   ? Date for PT Re-Evaluation 04/06/22   ? Authorization Type Medicare   ? Authorization - Visit Number 7   ? Progress Note Due on Visit 10   ? PT Start Time 1105   ? PT Stop Time 1145   ? PT Time Calculation (min) 40 min   ? Activity Tolerance Patient tolerated treatment well   ? Behavior During Therapy [facility] for tasks assessed/performed   ? ?  ?  ? ?  ? ? ?Past Medical History:  ?Diagnosis Date  ? Essential hypertension, benign 06/29/2011  ? Hemorrhoids 08/01/2018  ? Morbid obesity ([place]) 10/12/2018  ? 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 Prediabetes 05/16/2012  ? Testosterone deficiency 10/12/2018  ? Tubular adenoma of colon 10/17/2018  ? Colonoscopy 2016 at digestive health specialist.  Plan for 5-year recheck.  ? Vitamin D deficiency 08/01/2017  ? ? ?Past Surgical History:  ?Procedure Laterality Date  ? KIDNEY STONE SURGERY    ? SHOULDER SURGERY  December 2010  ? Left  ? UMBILICAL HERNIA REPAIR    ? ? ?There were no vitals filed for this visit. ? ? Subjective Assessment - 03/07/22 1109   ? ? Subjective Pt states he didn't do any exercises this past weekend.   ? Limitations Standing;Walking;House hold activities   ? How long can you sit comfortably? Not as bad as standing   ? How long can you stand comfortably? ~15-20 min start hurting (especially L knee and R hip)   ? How long can you walk comfortably? ~30 yards; if cool and damp will feel worse   ? Patient Stated Goals Decrease pain   ? Currently in Pain? Yes   ? Pain Score 8    ? Pain Location Knee   ? Pain Orientation Left   ? Pain Descriptors /  Indicators Sore   ? Pain Type Chronic pain   ? Pain Onset More than a month ago   ? Pain Onset More than a month ago   ? ?  ?  ? ?  ? ? ? ? ? [facility] PT Assessment - 03/07/22 0001   ? ?  ? Assessment  ? Medical Diagnosis M17.0 (ICD-10-CM) - Primary osteoarthritis of both knees  M16.11 (ICD-10-CM) - Primary osteoarthritis of right hip  M75.81 (ICD-10-CM) - Rotator cuff tendinitis, right  M17.12 (ICD-10-CM) - Primary osteoarthritis of left knee   ? Referring Provider (PT) Rosemarie Ax, MD   ? ?  ?  ? ?  ? ? ? ? ? ? ? ? ? ? ? ? ? ? ? ? [place] Adult PT Treatment/Exercise - 03/07/22 0001   ? ?  ? Knee/Hip Exercises: Aerobic  ? Nustep L5 x 5 min LEs/UEs   ?  ? Knee/Hip Exercises: Standing  ? Heel Raises Both;10 reps   ? Other Standing Knee Exercises weight shift L<>R x10   ?  ? Knee/Hip Exercises: Seated  ? Long Arc Quad Strengthening;Right;Left;10 reps;2 sets   ? Long Arc Quad Weight 2 lbs.   ? Marching Strengthening;10 reps   ?  Marching Weights 2 lbs.   ? Sit to Sand 10 reps;without UE support   ?  ? Knee/Hip Exercises: Supine  ? Straight Leg Raises Strengthening;Left;10 reps;2 sets   ? Straight Leg Raises Limitations short range only   ? Other Supine Knee/Hip Exercises hip abduction on R x10   ?  ? Knee/Hip Exercises: Sidelying  ? Hip ABduction Strengthening;Left;Right;2 sets;10 reps   ? Clams x20   ?  ? Manual Therapy  ? Joint Mobilization gentle grade III LAD L hip   ? ?  ?  ? ?  ? ? ? ? ? ? ? ? ? ? ? ? PT Short Term Goals - 02/23/22 1142   ? ?  ? PT SHORT TERM GOAL #1  ? Title Pt will be independent with initial HEP   ? Time 4   ? Period Weeks   ? Status On-going   ? Target Date 03/09/22   ?  ? PT SHORT TERM GOAL #2  ? Title Pt will be able to flex R hip at least 1" off seat to demo improving R hip ROM   ? Baseline ~1/4" off plinth   ? Time 4   ? Period Weeks   ? Status Achieved   ? Target Date 03/09/22   ?  ? PT SHORT TERM GOAL #3  ? Title Pt will have improved bilat knee ROM to at least 5 to 115 deg for improved  mobility and transfers   ? Time 4   ? Period Weeks   ? Status On-going   ? Target Date 03/09/22   ? ?  ?  ? ?  ? ? ? ? PT Long Term Goals - 02/09/22 1141   ? ?  ? PT LONG TERM GOAL #1  ? Title Pt will be independent with final HEP   ? Time 8   ? Period Weeks   ? Status New   ? Target Date 04/06/22   ?  ? PT LONG TERM GOAL #2  ? Title Pt will demo at least 4/5 bilat hip strength for improved single leg stability   ? Time 8   ? Period Weeks   ? Status New   ? Target Date 04/06/22   ?  ? PT LONG TERM GOAL #3  ? Title Pt will be able to amb at least 400' with no a/d with </=2/10 pain   ? Time 8   ? Period Weeks   ? Status New   ? Target Date 04/06/22   ?  ? PT LONG TERM GOAL #4  ? Title Pt will be able to stand at least 15-20 min with </=2/10 pain   ? Time 8   ? Period Weeks   ? Status New   ? Target Date 04/06/22   ?  ? PT LONG TERM GOAL #5  ? Title Pt will have improved FOTO score to at least 45   ? Baseline 15   ? Time 8   ? Period Weeks   ? Status New   ? Target Date 04/06/22   ? ?  ?  ? ?  ? ? ? ? ? ? ? ? Plan - 03/07/22 1139   ? ? Clinical Impression Statement Increasing pt's exercises into standing. Able to tolerate weight shifts L<>R. Still unable to place full weight on L LE without increased pain.   ? Personal Factors and Comorbidities Age;Fitness;Time since onset of injury/illness/exacerbation;Comorbidity 1   ? Comorbidities OA  bilat knee and R hip; previous L shoulder surgery   ? Examination-Activity Limitations Locomotion Level;Transfers;Stand;Squat;Lift   ? Examination-Participation Restrictions Community Activity;Occupation;Yard Work;Shop   ? Stability/Clinical Decision Making Evolving/Moderate complexity   ? Rehab Potential Good   ? PT Frequency 2x / week   ? PT Duration 8 weeks   ? PT Treatment/Interventions ADLs/Self Care Home Management;Aquatic Therapy;Cryotherapy;Electrical Stimulation;Iontophoresis '4mg'$ /ml Dexamethasone;Moist Heat;DME Instruction;Gait training;Stair training;Functional mobility  training;Therapeutic activities;Therapeutic exercise;Balance training;Neuromuscular re-education;Manual techniques;Patient/family education;Passive range of motion;Dry needling;Taping   ? PT Next Visit Plan Continue to work on hip and knee ROM. Strengthen hip and knee. Trial leg press and quad eccentric contractions   ? PT Home Exercise Plan [facility]   ? Consulted and Agree with Plan of Care Patient   ? ?  ?  ? ?  ? ? ?Patient will benefit from skilled therapeutic intervention in order to improve the following deficits and impairments:  Abnormal gait, Decreased range of motion, Increased fascial restricitons, Difficulty walking, Decreased endurance, Increased muscle spasms, Decreased activity tolerance, Pain, Decreased balance, Hypomobility, Impaired flexibility, Decreased mobility, Decreased strength, Increased edema, Postural dysfunction ? ?Visit Diagnosis: ?Chronic pain of left knee ? ?Stiffness of left knee, not elsewhere classified ? ?Pain in right hip ? ?Stiffness of right hip, not elsewhere classified ? ?Abnormal posture ? ?Muscle weakness (generalized) ? ?Localized edema ? ? ? ? ?Problem List ?Patient Active Problem List  ? Diagnosis Date Noted  ? Rotator cuff tendinitis, right 02/03/2022  ? Primary osteoarthritis of right hip 06/24/2021  ? Primary osteoarthritis of left knee 03/29/2021  ? Hamstring strain, left, initial encounter 02/26/2021  ? 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 OA (osteoarthritis) of knee 08/16/2020  ? Neoplasm of uncertain behavior of skin 03/12/2020  ? Tubular adenoma of colon 10/17/2018  ? Hyperlipidemia 10/12/2018  ? Greater trochanteric bursitis of both hips 10/12/2018  ? Morbid obesity ([place]) 10/12/2018  ? Vitamin D deficiency 08/01/2017  ? 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 Family history of colon cancer requiring screening colonoscopy 05/16/2012  ? Prediabetes 05/16/2012  ? Tobacco abuse 07/20/2011  ? Hypogonadism male 07/20/2011  ?  Essential hypertension, benign 06/29/2011  ? ? ?Denali Becvar April Gordy Levan, PT, DPT ?03/07/2022, 12:39 PM ? ?[place] ?[facility] ?[place] ?[place]

## 2022-03-09 ENCOUNTER — Ambulatory Visit: Payer: Medicare Other | Admitting: Physical Therapy

## 2022-03-09 ENCOUNTER — Other Ambulatory Visit: Payer: Self-pay

## 2022-03-09 DIAGNOSIS — M6281 Muscle weakness (generalized): Secondary | ICD-10-CM

## 2022-03-09 DIAGNOSIS — M25551 Pain in right hip: Secondary | ICD-10-CM

## 2022-03-09 DIAGNOSIS — G8929 Other chronic pain: Secondary | ICD-10-CM

## 2022-03-09 DIAGNOSIS — R600 Localized edema: Secondary | ICD-10-CM

## 2022-03-09 DIAGNOSIS — R293 Abnormal posture: Secondary | ICD-10-CM | POA: Diagnosis not present

## 2022-03-09 DIAGNOSIS — M1611 Unilateral primary osteoarthritis, right hip: Secondary | ICD-10-CM | POA: Diagnosis not present

## 2022-03-09 DIAGNOSIS — M25651 Stiffness of right hip, not elsewhere classified: Secondary | ICD-10-CM

## 2022-03-09 DIAGNOSIS — M25562 Pain in left knee: Secondary | ICD-10-CM | POA: Diagnosis not present

## 2022-03-09 DIAGNOSIS — M25662 Stiffness of left knee, not elsewhere classified: Secondary | ICD-10-CM

## 2022-03-09 NOTE — Therapy (Signed)
[place] ?[facility] ?[address] ?[place], [place], [address] ?Phone: [phone]   Fax:  [phone] ? ?Physical Therapy Treatment ? ?Patient Details  ?Name: Erik Estrada ?MRN: [id number] ?Date of Birth: 07/07/1951 ?Referring Provider (PT): Rosemarie Ax, MD ? ? ?Encounter Date: 03/09/2022 ? ? PT End of Session - 03/09/22 1054   ? ? Visit Number 8   ? Number of Visits 16   ? Date for PT Re-Evaluation 04/06/22   ? Authorization Type Medicare   ? Authorization - Visit Number 8   ? Progress Note Due on Visit 10   ? PT Start Time 1055   ? PT Stop Time 1140   ? PT Time Calculation (min) 45 min   ? Activity Tolerance Patient tolerated treatment well   ? Behavior During Therapy [facility] for tasks assessed/performed   ? ?  ?  ? ?  ? ? ?Past Medical History:  ?Diagnosis Date  ? Essential hypertension, benign 06/29/2011  ? Hemorrhoids 08/01/2018  ? Morbid obesity ([place]) 10/12/2018  ? 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 Prediabetes 05/16/2012  ? Testosterone deficiency 10/12/2018  ? Tubular adenoma of colon 10/17/2018  ? Colonoscopy 2016 at digestive health specialist.  Plan for 5-year recheck.  ? Vitamin D deficiency 08/01/2017  ? ? ?Past Surgical History:  ?Procedure Laterality Date  ? KIDNEY STONE SURGERY    ? SHOULDER SURGERY  December 2010  ? Left  ? UMBILICAL HERNIA REPAIR    ? ? ?There were no vitals filed for this visit. ? ? Subjective Assessment - 03/09/22 1059   ? ? Subjective Pt reports he was able to do his exercises with the red band yesterday. Pt states he felt pretty good after last session but had a lot of pain yesterday. Pt is to see surgeon today.   ? Limitations Standing;Walking;House hold activities   ? How long can you sit comfortably? Not as bad as standing   ? How long can you stand comfortably? ~15-20 min start hurting (especially L knee and R hip)   ? How long can you walk comfortably? ~30 yards; if cool and damp will feel worse   ? Patient Stated Goals  Decrease pain   ? Currently in Pain? Yes   ? Pain Score 7    ? Pain Location Knee   ? Pain Orientation Left   ? Pain Onset More than a month ago   ? Pain Score 7   ? Pain Location Hip   ? Pain Orientation Right   ? Pain Type Chronic pain   ? Pain Onset More than a month ago   ? ?  ?  ? ?  ? ? ? ? ? [facility] PT Assessment - 03/09/22 0001   ? ?  ? Assessment  ? Medical Diagnosis M17.0 (ICD-10-CM) - Primary osteoarthritis of both knees  M16.11 (ICD-10-CM) - Primary osteoarthritis of right hip  M75.81 (ICD-10-CM) - Rotator cuff tendinitis, right  M17.12 (ICD-10-CM) - Primary osteoarthritis of left knee   ? Referring Provider (PT) Rosemarie Ax, MD   ? ?  ?  ? ?  ? ? ? ? ? ? ? ? ? ? ? ? ? ? ? ? [place] Adult PT Treatment/Exercise - 03/09/22 0001   ? ?  ? Ambulation/Gait  ? Ambulation Distance (Feet) 160 Feet   ? Assistive device Straight cane   ? Gait Pattern Step-through pattern;Abducted - left;Abducted- right;Wide base of support;Lateral hip instability;Antalgic;Left flexed knee in stance;Decreased  stance time - right;Decreased stance time - left;Decreased stride length;Decreased step length - right;Decreased step length - left   ? Gait Comments performed for cool down   ?  ? Knee/Hip Exercises: Aerobic  ? Nustep L5 x 6 min LEs   ?  ? Knee/Hip Exercises: Machines for Strengthening  ? Total Gym Leg Press SL on L: eccentrics with 2 plates x 10, concentric 1 plate x5   ?  ? Knee/Hip Exercises: Standing  ? Heel Raises Both;10 reps   ? Other Standing Knee Exercises weight shift L<>R x10; weight on L LE with R LE sliding forward/back x10 (unable to tolerate due to knee popping)   ? Other Standing Knee Exercises hip abduction x10   ?  ? Knee/Hip Exercises: Supine  ? Quad Sets Strengthening;Left;5 reps   ? Short Arc [employer] Strengthening;Right;Left;10 reps;2 sets   ? Short Arc [employer] Limitations 2.5 lbs   ? Straight Leg Raises Strengthening;Left;10 reps;2 sets   ? Straight Leg Raises Limitations short range 2.5 lbs   ? Other  Supine Knee/Hip Exercises hip abduction AROM  on R x20, marching AROM on R with strap x20   ?  ? Knee/Hip Exercises: Sidelying  ? Hip ABduction Strengthening;Left;Right;2 sets;10 reps   ? Hip ABduction Limitations 2.5 lbs   ?  ? Knee/Hip Exercises: Prone  ? Hamstring Curl 10 reps   ? Hamstring Curl Limitations 2.5 lbs   ? Hip Extension Strengthening;Right;Left;2 sets;10 reps   ? Hip Extension Limitations 2.5 lbs   ?  ? Manual Therapy  ? Joint Mobilization gentle grade III LAD L hip   ? ?  ?  ? ?  ? ? ? ? ? ? ? ? ? ? ? ? PT Short Term Goals - 03/09/22 1316   ? ?  ? PT SHORT TERM GOAL #1  ? Title Pt will be independent with initial HEP   ? Time 4   ? Period Weeks   ? Status Achieved   ? Target Date 03/09/22   ?  ? PT SHORT TERM GOAL #2  ? Title Pt will be able to flex R hip at least 1" off seat to demo improving R hip ROM   ? Baseline ~1/4" off plinth   ? Time 4   ? Period Weeks   ? Status Achieved   ? Target Date 03/09/22   ?  ? PT SHORT TERM GOAL #3  ? Title Pt will have improved bilat knee ROM to at least 5 to 115 deg for improved mobility and transfers   ? Baseline -8 to 120   ? Time 4   ? Period Weeks   ? Status Partially Met   ? Target Date 03/09/22   ? ?  ?  ? ?  ? ? ? ? PT Long Term Goals - 02/09/22 1141   ? ?  ? PT LONG TERM GOAL #1  ? Title Pt will be independent with final HEP   ? Time 8   ? Period Weeks   ? Status New   ? Target Date 04/06/22   ?  ? PT LONG TERM GOAL #2  ? Title Pt will demo at least 4/5 bilat hip strength for improved single leg stability   ? Time 8   ? Period Weeks   ? Status New   ? Target Date 04/06/22   ?  ? PT LONG TERM GOAL #3  ? Title Pt will be able to amb  at least 400' with no a/d with </=2/10 pain   ? Time 8   ? Period Weeks   ? Status New   ? Target Date 04/06/22   ?  ? PT LONG TERM GOAL #4  ? Title Pt will be able to stand at least 15-20 min with </=2/10 pain   ? Time 8   ? Period Weeks   ? Status New   ? Target Date 04/06/22   ?  ? PT LONG TERM GOAL #5  ? Title Pt will have  improved FOTO score to at least 45   ? Baseline 15   ? Time 8   ? Period Weeks   ? Status New   ? Target Date 04/06/22   ? ?  ?  ? ?  ? ? ? ? ? ? ? ? Plan - 03/09/22 1314   ? ? Clinical Impression Statement Progressed pt's strengthening exercises to 2.5 lbs; however, pt unable to perform with full concentric contraction. Worked on eccentric end range quad strengthening on leg press this session. Able to perform standing hip abduction but needs to keep knee slightly bent to keep from increased pain. Discussed possibility of DOMs after performing strengthening exercises. Reduced pain by end of session.   ? Personal Factors and Comorbidities Age;Fitness;Time since onset of injury/illness/exacerbation;Comorbidity 1   ? Comorbidities OA bilat knee and R hip; previous L shoulder surgery   ? Examination-Activity Limitations Locomotion Level;Transfers;Stand;Squat;Lift   ? Examination-Participation Restrictions Community Activity;Occupation;Yard Work;Shop   ? Stability/Clinical Decision Making Evolving/Moderate complexity   ? Rehab Potential Good   ? PT Frequency 2x / week   ? PT Duration 8 weeks   ? PT Treatment/Interventions ADLs/Self Care Home Management;Aquatic Therapy;Cryotherapy;Electrical Stimulation;Iontophoresis 75m/ml Dexamethasone;Moist Heat;DME Instruction;Gait training;Stair training;Functional mobility training;Therapeutic activities;Therapeutic exercise;Balance training;Neuromuscular re-education;Manual techniques;Patient/family education;Passive range of motion;Dry needling;Taping   ? PT Next Visit Plan Continue to work on hip and knee ROM. Strengthen hip and knee. Continue leg press and quad eccentric contractions   ? [facility]  ? Consulted and Agree with Plan of Care Patient   ? ?  ?  ? ?  ? ? ?Patient will benefit from skilled therapeutic intervention in order to improve the following deficits and impairments:  Abnormal gait, Decreased range of motion, Increased fascial restricitons,  Difficulty walking, Decreased endurance, Increased muscle spasms, Decreased activity tolerance, Pain, Decreased balance, Hypomobility, Impaired flexibility, Decreased mobility, Decreased strength, Increased

## 2022-03-10 ENCOUNTER — Other Ambulatory Visit: Payer: Self-pay | Admitting: Family Medicine

## 2022-03-10 ENCOUNTER — Ambulatory Visit (INDEPENDENT_AMBULATORY_CARE_PROVIDER_SITE_OTHER): Payer: Medicare Other | Admitting: Family Medicine

## 2022-03-10 ENCOUNTER — Encounter: Payer: Self-pay | Admitting: Family Medicine

## 2022-03-10 VITALS — BP 153/75 | HR 95 | Ht 64.0 in | Wt 218.0 lb

## 2022-03-10 DIAGNOSIS — Z720 Tobacco use: Secondary | ICD-10-CM | POA: Diagnosis not present

## 2022-03-10 DIAGNOSIS — R7303 Prediabetes: Secondary | ICD-10-CM | POA: Diagnosis not present

## 2022-03-10 DIAGNOSIS — I10 Essential (primary) hypertension: Secondary | ICD-10-CM | POA: Diagnosis not present

## 2022-03-10 MED ORDER — LISINOPRIL 40 MG PO TABS: 40.0000 mg | ORAL_TABLET | Freq: Every day | ORAL | 1 refills | Status: DC

## 2022-03-10 NOTE — Assessment & Plan Note (Signed)
Blood pressure is elevated.  Increasing lisinopril to 40 mg daily.  Low-sodium diet and smoking cessation recommended.  Return in approximate 2 to 3 weeks for blood pressure recheck. ?

## 2022-03-10 NOTE — Patient Instructions (Signed)
Increase lisinopril to '40mg'$  daily.  ?Quit smoking. ?Return in 2-3 weeks for nurse visit to check BP ? ?

## 2022-03-10 NOTE — Progress Notes (Addendum)
?Erik Estrada - 71 y.o. male MRN [id number]  Date of birth: November 27, 1951 ? ?Subjective ?Chief Complaint  ?Patient presents with  ? Hip Pain  ? Knee Pain  ? ? ?HPI ?Erik Estrada is a 71 year old male here today for follow-up visit.  Reports overall he is doing well.  He is planning on having hip arthroplasty in May.  Reports that he will need preoperative clearance prior to having this done.  Blood pressure does remain elevated today.  He reports he is taking lisinopril 20 mg daily.  He has not had any symptoms related to hypertension including chest pain, shortness of breath, palpitations, headaches or vision changes.  He does continue to smoke fairly heavily.  He has no interest in quitting at this time.  He reports no problems with anesthesia in the past. ? ?ROS:  A comprehensive ROS was completed and negative except as noted per HPI ? ?Allergies  ?Allergen Reactions  ? Hydrochlorothiazide Other (See Comments)  ?  Fatigue  ? Lipitor [Atorvastatin Calcium] Other (See Comments)  ?  Myalgia  ? ? ?Past Medical History:  ?Diagnosis Date  ? Essential hypertension, benign 06/29/2011  ? Hemorrhoids 08/01/2018  ? Morbid obesity ([place]) 10/12/2018  ? 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 Prediabetes 05/16/2012  ? Testosterone deficiency 10/12/2018  ? Tubular adenoma of colon 10/17/2018  ? Colonoscopy 2016 at digestive health specialist.  Plan for 5-year recheck.  ? Vitamin D deficiency 08/01/2017  ? ? ?Past Surgical History:  ?Procedure Laterality Date  ? KIDNEY STONE SURGERY    ? SHOULDER SURGERY  December 2010  ? Left  ? UMBILICAL HERNIA REPAIR    ? ? ?Social History  ? ?Socioeconomic History  ? Marital status: Married  ?  Spouse name: Caren Griffins  ? Number of children: 2  ? Years of education: Not on file  ? Highest education level: Not on file  ?Occupational History  ? Occupation: Public librarian  ?  Comment: [employer]  ?Tobacco Use  ? Smoking status: Every Day  ?  Packs/day: 2.00  ?  Types: Cigarettes  ? Smokeless tobacco: Never  ?Vaping  Use  ? Vaping Use: Never used  ?Substance and Sexual Activity  ? Alcohol use: No  ? Drug use: No  ? Sexual activity: Yes  ?  Partners: Female  ?Other Topics Concern  ? Not on file  ?Social History Narrative  ? 2 caffeinated drinks daily. No regular exercise he does have a physical job.  ? ?Social Determinants of Health  ? ?Financial Resource Strain: Not on file  ?Food Insecurity: Not on file  ?Transportation Needs: Not on file  ?Physical Activity: Not on file  ?Stress: Not on file  ?Social Connections: Not on file  ? ? ?Family History  ?Problem Relation Age of Onset  ? Colon cancer Brother   ? Hypertension Mother   ? Parkinson's disease Mother   ? Stroke Brother   ? ? ?Health Maintenance  ?Topic Date Due  ? Zoster Vaccines- Shingrix (1 of 2) 06/10/2022 (Originally 10/22/1972)  ? INFLUENZA VACCINE  08/12/2022 (Originally 07/12/2021)  ? COVID-19 Vaccine (3 - Pfizer risk series) 08/12/2022 (Originally 03/24/2020)  ? COLONOSCOPY (Pts 45-56yr Insurance coverage will need to be confirmed)  03/11/2023 (Originally 11/29/2018)  ? Pneumonia Vaccine 69 Years old (3 - PPSV23 if available, else PCV20) 08/01/2022  ? TETANUS/TDAP  11/15/2022  ? Hepatitis C Screening  Completed  ? HPV VACCINES  Aged Out  ? ? ? ?----------------------------------------------------------------------------------------------------------------------------------------------------------------------------------------------------------------- ?Physical Exam ?BP (!) 153/75 (  BP Location: Left Arm, Patient Position: Sitting, Cuff Size: Large)   Pulse 95   Ht '5\' 4"'$  (1.626 m)   Wt 218 lb (98.9 kg)   SpO2 98%   BMI 37.42 kg/m?  ? ?Physical Exam ?Constitutional:   ?   Appearance: Normal appearance.  ?Eyes:  ?   General: No scleral icterus. ?Cardiovascular:  ?   Rate and Rhythm: Normal rate and regular rhythm.  ?Pulmonary:  ?   Effort: Pulmonary effort is normal.  ?   Breath sounds: Normal breath sounds.  ?Musculoskeletal:  ?   Cervical back: Neck supple.   ?Neurological:  ?   General: No focal deficit present.  ?   Mental Status: He is alert.  ?Psychiatric:     ?   Mood and Affect: Mood normal.     ?   Behavior: Behavior normal.  ? ? ?------------------------------------------------------------------------------------------------------------------------------------------------------------------------------------------------------------------- ?Assessment and Plan ? ?Essential hypertension, benign ?Blood pressure is elevated.  Increasing lisinopril to 40 mg daily.  Low-sodium diet and smoking cessation recommended.  Return in approximate 2 to 3 weeks for blood pressure recheck. ? ?Tobacco abuse ?Counseled on smoking cessation.  He has no interest in quitting at this time. ? ? ?Meds ordered this encounter  ?Medications  ? lisinopril (ZESTRIL) 40 MG tablet  ?  Sig: Take 1 tablet (40 mg total) by mouth daily.  ?  Dispense:  90 tablet  ?  Refill:  1  ? ? ?Return in about 6 months (around 09/10/2022) for HTN. ? ? ? ?This visit occurred during the SARS-CoV-2 public health emergency.  Safety protocols were in place, including screening questions prior to the visit, additional usage of staff PPE, and extensive cleaning of exam room while observing appropriate contact time as indicated for disinfecting solutions.  ? ?

## 2022-03-10 NOTE — Assessment & Plan Note (Signed)
Counseled on smoking cessation.  He has no interest in quitting at this time. ?

## 2022-03-11 LAB — CBC WITH DIFFERENTIAL/PLATELET
Absolute Monocytes: 808 cells/uL (ref 200–950)
Basophils Absolute: 66 cells/uL (ref 0–200)
Basophils Relative: 0.7 %
Eosinophils Absolute: 141 cells/uL (ref 15–500)
Eosinophils Relative: 1.5 %
HCT: 45.5 % (ref 38.5–50.0)
Hemoglobin: 15.4 g/dL (ref 13.2–17.1)
Lymphs Abs: 1560 cells/uL (ref 850–3900)
MCH: 31.4 pg (ref 27.0–33.0)
MCHC: 33.8 g/dL (ref 32.0–36.0)
MCV: 92.9 fL (ref 80.0–100.0)
MPV: 10.4 fL (ref 7.5–12.5)
Monocytes Relative: 8.6 %
Neutro Abs: 6824 cells/uL (ref 1500–7800)
Neutrophils Relative %: 72.6 %
Platelets: 271 10*3/uL (ref 140–400)
RBC: 4.9 10*6/uL (ref 4.20–5.80)
RDW: 12.8 % (ref 11.0–15.0)
Total Lymphocyte: 16.6 %
WBC: 9.4 10*3/uL (ref 3.8–10.8)

## 2022-03-11 LAB — HEMOGLOBIN A1C
Hgb A1c MFr Bld: 6.2 % of total Hgb — ABNORMAL HIGH (ref <5.7)
Mean Plasma Glucose: 131 mg/dL
eAG (mmol/L): 7.3 mmol/L

## 2022-03-11 LAB — COMPLETE METABOLIC PANEL WITH GFR
AG Ratio: 1.7 (calc) (ref 1.0–2.5)
ALT: 13 U/L (ref 9–46)
AST: 14 U/L (ref 10–35)
Albumin: 4.3 g/dL (ref 3.6–5.1)
Alkaline phosphatase (APISO): 73 U/L (ref 35–144)
BUN: 10 mg/dL (ref 7–25)
CO2: 25 mmol/L (ref 20–32)
Calcium: 9.5 mg/dL (ref 8.6–10.3)
Chloride: 105 mmol/L (ref 98–110)
Creat: 0.83 mg/dL (ref 0.70–1.35)
Globulin: 2.6 g/dL (calc) (ref 1.9–3.7)
Glucose, Bld: 110 mg/dL — ABNORMAL HIGH (ref 65–99)
Potassium: 4.5 mmol/L (ref 3.5–5.3)
Sodium: 139 mmol/L (ref 135–146)
Total Bilirubin: 0.6 mg/dL (ref 0.2–1.2)
Total Protein: 6.9 g/dL (ref 6.1–8.1)
eGFR: 95 mL/min/{1.73_m2} (ref >=60)

## 2022-03-13 ENCOUNTER — Encounter: Payer: Self-pay | Admitting: Family Medicine

## 2022-03-15 ENCOUNTER — Other Ambulatory Visit: Payer: Self-pay | Admitting: Family Medicine

## 2022-03-15 ENCOUNTER — Ambulatory Visit: Payer: Medicare Other | Admitting: Rehabilitative and Restorative Service Providers"

## 2022-03-15 DIAGNOSIS — M1712 Unilateral primary osteoarthritis, left knee: Secondary | ICD-10-CM

## 2022-03-15 MED ORDER — HYDROCODONE-ACETAMINOPHEN 5-325 MG PO TABS: 1.0000 | ORAL_TABLET | Freq: Three times a day (TID) | ORAL | 0 refills | Status: DC | PRN

## 2022-03-15 NOTE — Progress Notes (Signed)
Provided norco before his surgery.  ? ?Rosemarie Ax, MD ?[facility] ?03/15/2022, 1:14 PM ? ?

## 2022-03-18 ENCOUNTER — Encounter: Payer: Medicare Other | Admitting: Physical Therapy

## 2022-03-21 ENCOUNTER — Encounter: Payer: Medicare Other | Admitting: Physical Therapy

## 2022-03-23 ENCOUNTER — Encounter: Payer: Medicare Other | Admitting: Physical Therapy

## 2022-03-23 ENCOUNTER — Other Ambulatory Visit: Payer: Self-pay | Admitting: Orthopaedic Surgery

## 2022-03-24 ENCOUNTER — Ambulatory Visit (INDEPENDENT_AMBULATORY_CARE_PROVIDER_SITE_OTHER): Payer: Medicare Other | Admitting: Family Medicine

## 2022-03-24 VITALS — BP 140/59 | HR 88

## 2022-03-24 DIAGNOSIS — I10 Essential (primary) hypertension: Secondary | ICD-10-CM | POA: Diagnosis not present

## 2022-03-24 NOTE — Progress Notes (Signed)
Patient comes in today for blood pressure check.  ? ?Erik Estrada is taking Lisinopril 40 mg daily for blood pressure control. This was increased from 20 mg at his last visit. He denies any missed doses, side effects, headaches, chest pain, palpitations, dizziness, or shortness of breath.  ? ?Erik Estrada hasn't been checking blood pressure readings at home. ? ?His first blood pressure reading today is: 148/57. ?After sitting, his second reading is: 140/59.  ? ?I spoke with Dr. Zigmund Daniel who advised to stay on same dosage and follow up in 3 months. I advised patient to check blood pressures at home 1-2 times a week and call if readings change.  ? ?

## 2022-03-24 NOTE — Progress Notes (Signed)
Medical screening examination/treatment was performed by qualified clinical staff member and as supervising physician I was immediately available for consultation/collaboration. I have reviewed documentation and agree with assessment and plan.  Tomeca Helm, DO  

## 2022-03-28 ENCOUNTER — Ambulatory Visit (HOSPITAL_BASED_OUTPATIENT_CLINIC_OR_DEPARTMENT_OTHER): Payer: Medicare Other | Admitting: Physical Therapy

## 2022-03-28 ENCOUNTER — Encounter: Payer: Medicare Other | Admitting: Physical Therapy

## 2022-03-30 ENCOUNTER — Ambulatory Visit (HOSPITAL_BASED_OUTPATIENT_CLINIC_OR_DEPARTMENT_OTHER): Payer: Medicare Other | Admitting: Physical Therapy

## 2022-03-31 ENCOUNTER — Encounter: Payer: Medicare Other | Admitting: Physical Therapy

## 2022-04-06 NOTE — Patient Instructions (Addendum)
DUE TO COVID-19 ONLY TWO VISITORS  (aged 69 and older)  ARE ALLOWED TO COME WITH YOU AND STAY IN THE WAITING ROOM ONLY DURING PRE OP AND PROCEDURE.   ?**NO VISITORS ARE ALLOWED IN THE SHORT STAY AREA OR RECOVERY ROOM!!** ? ?IF YOU WILL BE ADMITTED INTO THE HOSPITAL YOU ARE ALLOWED ONLY FOUR SUPPORT PEOPLE DURING VISITATION HOURS ONLY (7 AM -8PM)   ?The support person(s) must pass our screening, gel in and out, and wear a mask at all times, including in the patient?s room. ?Patients must also wear a mask when staff or their support person are in the room. ?Visitors GUEST BADGE MUST BE WORN VISIBLY  ?One adult visitor may remain with you overnight and MUST be in the room by 8 P.M. ?  ? ? Your procedure is scheduled on: 04/19/22 ? ? Report to [facility] Main Entrance ? ?  Report to admitting at  7:00 AM ? ? Call this number if you have problems the morning of surgery [phone] ? ? Do not eat food :After Midnight. ? ? After Midnight you may have the following liquids until 6:50 AM  DAY OF SURGERY ? ?Water ?Black Coffee (sugar ok, NO MILK/CREAM OR CREAMERS)  ?Tea (sugar ok, NO MILK/CREAM OR CREAMERS) regular and decaf                             ?Plain Jell-O (NO RED)                                           ?Fruit ices (not with fruit pulp, NO RED)                                     ?Popsicles (NO RED)                                                                  ?Juice: apple, WHITE grape, WHITE cranberry ?Sports drinks like Gatorade (NO RED) ?Clear broth(vegetable,chicken,beef)  ?  ?Oral Hygiene is also important to reduce your risk of infection.                                    ?Remember - BRUSH YOUR TEETH THE MORNING OF SURGERY WITH YOUR REGULAR TOOTHPASTE ? ? Do NOT smoke after Midnight ? ? Take these medicines the morning of surgery with A SIP OF WATER: None ?                  ?           You may not have any metal on your body including jewelry, and body piercing ? ?           Do not wear  lotions, powders, cologne, or deodorant ? ?            Men may shave face and neck. ? ? Do not bring valuables to the hospital. [facility]  IS NOT RESPONSIBLE   FOR VALUABLES. ? ? Contacts, dentures or bridgework may not be worn into surgery. ?   ? Patients discharged on the day of surgery will not be allowed to drive home.  Someone NEEDS to stay with you for the first 24 hours after anesthesia. ? ?Special Instructions: Bring a copy of your healthcare power of attorney and living will documents the day of surgery if you haven't scanned them before. ? ?Please read over the following fact sheets you were given: IF [place] ? ?   [facility] - Preparing for Surgery ?Before surgery, you can play an important role.  Because skin is not sterile, your skin needs to be as free of germs as possible.  You can reduce the number of germs on your skin by washing with CHG (chlorahexidine gluconate) soap before surgery.  CHG is an antiseptic cleaner which kills germs and bonds with the skin to continue killing germs even after washing. ?Please DO NOT use if you have an allergy to CHG or antibacterial soaps.  If your skin becomes reddened/irritated stop using the CHG and inform your nurse when you arrive at Short Stay. You may shave your face/neck. ?Please follow these instructions carefully: ? 1.  Shower with CHG Soap the night before surgery and the  morning of Surgery. ? 2.  If you choose to wash your hair, wash your hair first as usual with your  normal  shampoo. ? 3.  After you shampoo, rinse your hair and body thoroughly to remove the  shampoo.                     ?       4.  Use CHG as you would any other liquid soap.  You can apply chg directly  to the skin and wash  ?                     Gently with a scrungie or clean washcloth. ? 5.  Apply the CHG Soap to your body ONLY FROM THE NECK DOWN.   Do not use on face/ open      ?                     Wound or  open sores. Avoid contact with eyes, ears mouth and genitals (private parts).  ?                     Production manager,  Genitals (private parts) with your normal soap. ?            6.  Wash thoroughly, paying special attention to the area where your surgery  will be performed. ? 7.  Thoroughly rinse your body with warm water from the neck down. ? 8.  DO NOT shower/wash with your normal soap after using and rinsing off  the CHG Soap. ?               9.  Pat yourself dry with a clean towel. ?           10.  Wear clean pajamas. ?           11.  Place clean sheets on your bed the night of your first shower and do not  sleep with pets. ?Day of Surgery : ?Do not apply any lotions/deodorants the morning of surgery.  Please  wear clean clothes to the hospital/surgery center. ? ?FAILURE TO FOLLOW THESE INSTRUCTIONS MAY RESULT IN THE CANCELLATION OF YOUR SURGERY ? ? ? ?________________________________________________________________________  ? ?Incentive Spirometer ? ?An incentive spirometer is a tool that can help keep your lungs clear and active. This tool measures how well you are filling your lungs with each breath. Taking long deep breaths may help reverse or decrease the chance of developing breathing (pulmonary) problems (especially infection) following: ?A long period of time when you are unable to move or be active. ?BEFORE THE PROCEDURE  ?If the spirometer includes an indicator to show your best effort, your nurse or respiratory therapist will set it to a desired goal. ?If possible, sit up straight or lean slightly forward. Try not to slouch. ?Hold the incentive spirometer in an upright position. ?INSTRUCTIONS FOR USE  ?Sit on the edge of your bed if possible, or sit up as far as you can in bed or on a chair. ?Hold the incentive spirometer in an upright position. ?Breathe out normally. ?Place the mouthpiece in your mouth and seal your lips tightly around it. ?Breathe in slowly and as deeply as possible, raising the piston  or the ball toward the top of the column. ?Hold your breath for 3-5 seconds or for as long as possible. Allow the piston or ball to fall to the bottom of the column. ?Remove the mouthpiece from your mouth and breathe out normally. ?Rest for a few seconds and repeat Steps 1 through 7 at least 10 times every 1-2 hours when you are awake. Take your time and take a few normal breaths between deep breaths. ?The spirometer may include an indicator to show your best effort. Use the indicator as a goal to work toward during each repetition. ?After each set of 10 deep breaths, practice coughing to be sure your lungs are clear. If you have an incision (the cut made at the time of surgery), support your incision when coughing by placing a pillow or rolled up towels firmly against it. ?Once you are able to get out of bed, walk around indoors and cough well. You may stop using the incentive spirometer when instructed by your caregiver.  ?RISKS AND COMPLICATIONS ?Take your time so you do not get dizzy or light-headed. ?If you are in pain, you may need to take or ask for pain medication before doing incentive spirometry. It is harder to take a deep breath if you are having pain. ?AFTER USE ?Rest and breathe slowly and easily. ?It can be helpful to keep track of a log of your progress. Your caregiver can provide you with a simple table to help with this. ?If you are using the spirometer at home, follow these instructions: ?SEEK MEDICAL CARE IF:  ?You are having difficultly using the spirometer. ?You have trouble using the spirometer as often as instructed. ?Your pain medication is not giving enough relief while using the spirometer. ?You develop fever of 100.5? F (38.1? C) or higher. ?SEEK IMMEDIATE MEDICAL CARE IF:  ?You cough up bloody sputum that had not been present before. ?You develop fever of 102? F (38.9? C) or greater. ?You develop worsening pain at or near the incision site. ?MAKE SURE YOU:  ?Understand these  instructions. ?Will watch your condition. ?Will get help right away if you are not doing well or get worse. ?Document Released: 04/10/2007 Document Revised: 02/20/2012 Document Reviewed: 06/11/2007 ?ExitCare? Patient I

## 2022-04-08 ENCOUNTER — Encounter (HOSPITAL_COMMUNITY): Payer: Self-pay

## 2022-04-08 ENCOUNTER — Encounter (HOSPITAL_COMMUNITY)
Admission: RE | Admit: 2022-04-08 | Discharge: 2022-04-08 | Disposition: A | Discharge status: Home / Self Care | Payer: Medicare Other | Source: Ambulatory Visit | Attending: Orthopaedic Surgery | Admitting: Orthopaedic Surgery

## 2022-04-08 ENCOUNTER — Ambulatory Visit (HOSPITAL_COMMUNITY)
Admission: RE | Admit: 2022-04-08 | Discharge: 2022-04-08 | Disposition: A | Discharge status: Home / Self Care | Payer: Medicare Other | Source: Ambulatory Visit | Attending: Orthopaedic Surgery | Admitting: Orthopaedic Surgery

## 2022-04-08 ENCOUNTER — Other Ambulatory Visit: Payer: Self-pay

## 2022-04-08 DIAGNOSIS — R7303 Prediabetes: Secondary | ICD-10-CM

## 2022-04-08 DIAGNOSIS — Z01818 Encounter for other preprocedural examination: Secondary | ICD-10-CM | POA: Diagnosis not present

## 2022-04-08 HISTORY — DX: Unspecified osteoarthritis, unspecified site: M19.90

## 2022-04-08 HISTORY — DX: Anxiety disorder, unspecified: F41.9

## 2022-04-08 HISTORY — DX: Prediabetes: R73.03

## 2022-04-08 HISTORY — DX: Personal history of urinary calculi: Z87.442

## 2022-04-08 LAB — TYPE AND SCREEN
ABO/RH(D): O POS
Antibody Screen: NEGATIVE

## 2022-04-08 LAB — BASIC METABOLIC PANEL
Anion gap: 6 (ref 5–15)
BUN: 11 mg/dL (ref 8–23)
CO2: 27 mmol/L (ref 22–32)
Calcium: 9.5 mg/dL (ref 8.9–10.3)
Chloride: 103 mmol/L (ref 98–111)
Creatinine, Ser: 0.9 mg/dL (ref 0.61–1.24)
GFR, Estimated: >60 mL/min (ref >60)
Glucose, Bld: 107 mg/dL — ABNORMAL HIGH (ref 70–99)
Potassium: 4.5 mmol/L (ref 3.5–5.1)
Sodium: 136 mmol/L (ref 135–145)

## 2022-04-08 LAB — CBC
HCT: 46.9 % (ref 39.0–52.0)
Hemoglobin: 15.8 g/dL (ref 13.0–17.0)
MCH: 31.6 pg (ref 26.0–34.0)
MCHC: 33.7 g/dL (ref 30.0–36.0)
MCV: 93.8 fL (ref 80.0–100.0)
Platelets: 244 10*3/uL (ref 150–400)
RBC: 5 MIL/uL (ref 4.22–5.81)
RDW: 13.2 % (ref 11.5–15.5)
WBC: 11 10*3/uL — ABNORMAL HIGH (ref 4.0–10.5)
nRBC: 0 % (ref 0.0–0.2)

## 2022-04-08 LAB — SURGICAL PCR SCREEN
MRSA, PCR: NEGATIVE
Staphylococcus aureus: POSITIVE — AB

## 2022-04-08 LAB — GLUCOSE, CAPILLARY: Glucose-Capillary: 113 mg/dL — ABNORMAL HIGH (ref 70–99)

## 2022-04-08 NOTE — Progress Notes (Addendum)
COVID Vaccine Completed:  Yes x2 ?Date COVID Vaccine completed: 02-03-20 02-25-20 ?Has received booster:  No ?COVID vaccine manufacturer: Pfizer     ? ?Date of COVID positive in last 90 days:  No ? ?PCP - Luetta Nutting, DO ?Cardiologist - N/A ? ?Chest x-ray - 04-08-22 Epic ?EKG - 04-08-22 Epic ?Stress Test - N/A ?ECHO - N/A ?Cardiac Cath - N/A ?Pacemaker/ICD device last checked: ?Spinal Cord Stimulator: ? ?Bowel Prep - N/A ? ?Sleep Study - N/A ?CPAP -  ? ?Fasting Blood Sugar -  ?Checks Blood Sugar - does not check  ? ?Blood Thinner Instructions:  N/A ?Aspirin Instructions: ?Last Dose: ? ?Activity level:   Can go up a flight of stairs and perform activities of daily living without stopping and without symptoms of chest pain or shortness of breath.  Some limitations due to hip pain.  ? ?Anesthesia review: Stop Bang 5 ? ?Patient denies shortness of breath, fever, cough and chest pain at PAT appointment ? ? ?Patient verbalized understanding of instructions that were given to them at the PAT appointment. Patient was also instructed that they will need to review over the PAT instructions again at home before surgery.  ?

## 2022-04-08 NOTE — Progress Notes (Signed)
PCR results sent to Dr. Rhona Raider to review. ?

## 2022-04-08 NOTE — Progress Notes (Signed)
?   04/08/22 1108  ?OBSTRUCTIVE SLEEP APNEA  ?Have you ever been diagnosed with sleep apnea through a sleep study? No  ?Do you snore loudly (loud enough to be heard through closed doors)?  0  ?Do you often feel tired, fatigued, or sleepy during the daytime (such as falling asleep during driving or talking to someone)? 0  ?Has anyone observed you stop breathing during your sleep? 1  ?Do you have, or are you being treated for high blood pressure? 1  ?BMI more than 35 kg/m2? 1  ?Age > 26 (1-yes) 1  ?Neck circumference greater than:Male 16 inches or larger, Male 17inches or larger? 0  ?Male Gender (Yes=1) 1  ?Obstructive Sleep Apnea Score 5  ?Score 5 or greater  Results sent to PCP  ? ? ?

## 2022-04-12 ENCOUNTER — Ambulatory Visit (INDEPENDENT_AMBULATORY_CARE_PROVIDER_SITE_OTHER): Payer: Medicare Other | Admitting: Family Medicine

## 2022-04-12 ENCOUNTER — Encounter: Payer: Self-pay | Admitting: Family Medicine

## 2022-04-12 DIAGNOSIS — J309 Allergic rhinitis, unspecified: Secondary | ICD-10-CM | POA: Diagnosis not present

## 2022-04-12 MED ORDER — PREDNISONE 50 MG PO TABS: ORAL_TABLET | ORAL | 0 refills | Status: DC

## 2022-04-12 NOTE — Patient Instructions (Signed)
Start over the counter cetirizine (zyrtec) and flonase daily.  ?Start prednisone '50mg'$  daily for 5 days.  ?

## 2022-04-12 NOTE — Care Plan (Signed)
Ortho Bundle Case Management Note ? ?Patient Details  ?Name: Erik Estrada ?MRN: [id number] ?Date of Birth: 03-Jul-1951 ? ?  Patient will discharge to home with family to assist. Rolling walker ordered for home. OPPT set up with [facility]. Patient and MD in agreement with plan. Choice offered               ? ? ? ?DME Arranged:  Walker rolling ?DME Agency:  [employer] ? ?HH Arranged:    ?Petal Agency:    ? ?Additional Comments: ?Please contact me with any questions of if this plan should need to change. ? ?Mardelle Matte  [facility] Orthopaedic Specialist  [phone] ?04/12/2022, 9:16 AM ?  ?

## 2022-04-12 NOTE — Assessment & Plan Note (Signed)
Recommend starting cetirizine and flonase daily.  Discussed allergen avoidance.  Will add on prednisone burst as well for 5 days.  Instructed to let me know if symptoms worsen or are not improving with recommended treatment.  ?

## 2022-04-12 NOTE — Progress Notes (Signed)
?Devarius Estrada - 71 y.o. male MRN [id number]  Date of birth: 1951/12/09 ? ?Subjective ?Chief Complaint  ?Patient presents with  ? Nasal Congestion  ? ? ?HPI ?Erik Estrada is 71 y.o. male here today with complaint of congestion, nasal drainage and mild cough.  Symptoms started a couple of weeks ago.  They have not worsened since onset.  He does have some sinus pressure but denies pain.  Mucus is clear and thin.  He denies fever, chills, shortness of breath, headache, nausea, vomiting, diarrhea.  He has tried OTC cold medications without much relief.  He has not tried any antihistamines.  ? ?ROS:  A comprehensive ROS was completed and negative except as noted per HPI ? ?Allergies  ?Allergen Reactions  ? Hydrochlorothiazide Other (See Comments)  ?  Fatigue  ? Lipitor [Atorvastatin Calcium] Other (See Comments)  ?  Myalgia  ? ? ?Past Medical History:  ?Diagnosis Date  ? Anxiety   ? Arthritis   ? Essential hypertension, benign 06/29/2011  ? Hemorrhoids 08/01/2018  ? History of kidney stones   ? Morbid obesity ([place]) 10/12/2018  ? 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 Pre-diabetes   ? Testosterone deficiency 10/12/2018  ? Tubular adenoma of colon 10/17/2018  ? Colonoscopy [facility] at [facility].  Plan for 5-year recheck.  ? Vitamin D deficiency 08/01/2017  ? ? ?Past Surgical History:  ?Procedure Laterality Date  ? KIDNEY STONE SURGERY    ? SHOULDER SURGERY  December 2010  ? Left  ? UMBILICAL HERNIA REPAIR    ? ? ?Social History  ? ?Socioeconomic History  ? Marital status: Married  ?  Spouse name: Erik Estrada  ? Number of children: 2  ? Years of education: Not on file  ? Highest education level: Not on file  ?Occupational History  ? Occupation: Public librarian  ?  Comment: [employer]  ?Tobacco Use  ? Smoking status: Every Day  ?  Packs/day: 1.50  ?  Years: 22.00  ?  Pack years: 33.00  ?  Types: Cigarettes  ? Smokeless tobacco: Never  ?Vaping Use  ? Vaping Use: Never used  ?Substance and Sexual Activity  ?  Alcohol use: No  ? Drug use: No  ? Sexual activity: Yes  ?  Partners: Female  ?Other Topics Concern  ? Not on file  ?Social History Narrative  ? 2 caffeinated drinks daily. No regular exercise he does have a physical job.  ? ?Social Determinants of Health  ? ?Financial Resource Strain: Not on file  ?Food Insecurity: Not on file  ?Transportation Needs: Not on file  ?Physical Activity: Not on file  ?Stress: Not on file  ?Social Connections: Not on file  ? ? ?Family History  ?Problem Relation Age of Onset  ? Colon cancer Brother   ? Hypertension Mother   ? Parkinson's disease Mother   ? Stroke Brother   ? ? ?Health Maintenance  ?Topic Date Due  ? Zoster Vaccines- Shingrix (1 of 2) 06/10/2022 (Originally 10/22/1970)  ? COVID-19 Vaccine (3 - Pfizer risk series) 08/12/2022 (Originally 03/24/2020)  ? COLONOSCOPY (Pts 45-23yr Insurance coverage will need to be confirmed)  03/11/2023 (Originally 11/29/2018)  ? INFLUENZA VACCINE  07/12/2022  ? Pneumonia Vaccine 69 Years old (3 - PPSV23 if available, else PCV20) 08/01/2022  ? TETANUS/TDAP  11/15/2022  ? Hepatitis C Screening  Completed  ? HPV VACCINES  Aged Out  ? ? ? ?----------------------------------------------------------------------------------------------------------------------------------------------------------------------------------------------------------------- ?Physical Exam ?BP (!) 147/83 (BP Location: Left Arm, Patient Position: Sitting, Cuff  Size: Large)   Pulse 91   Temp 98.1 ?F (36.7 ?C) (Oral)   Ht '5\' 4"'$  (1.626 m)   Wt 220 lb (99.8 kg)   SpO2 98%   BMI 37.76 kg/m?  ? ?Physical Exam ?Constitutional:   ?   Appearance: Normal appearance.  ?HENT:  ?   Right Ear: Tympanic membrane normal.  ?   Left Ear: Tympanic membrane normal.  ?   Mouth/Throat:  ?   Mouth: Mucous membranes are moist.  ?Eyes:  ?   General: No scleral icterus. ?Cardiovascular:  ?   Rate and Rhythm: Normal rate and regular rhythm.  ?Musculoskeletal:  ?   Cervical back: Neck supple.   ?Neurological:  ?   Mental Status: He is alert.  ?Psychiatric:     ?   Mood and Affect: Mood normal.     ?   Behavior: Behavior normal.  ? ? ?------------------------------------------------------------------------------------------------------------------------------------------------------------------------------------------------------------------- ?Assessment and Plan ? ?Allergic sinusitis ?Recommend starting cetirizine and flonase daily.  Discussed allergen avoidance.  Will add on prednisone burst as well for 5 days.  Instructed to let me know if symptoms worsen or are not improving with recommended treatment.  ? ? ?Meds ordered this encounter  ?Medications  ? predniSONE (DELTASONE) 50 MG tablet  ?  Sig: Take 1 tab po daily x5 days.  ?  Dispense:  5 tablet  ?  Refill:  0  ? ? ?No follow-ups on file. ? ? ? ?This visit occurred during the SARS-CoV-2 public health emergency.  Safety protocols were in place, including screening questions prior to the visit, additional usage of staff PPE, and extensive cleaning of exam room while observing appropriate contact time as indicated for disinfecting solutions.  ? ?

## 2022-04-18 NOTE — H&P (Signed)
TOTAL HIP ADMISSION H&P ? ?Patient is admitted for right total hip arthroplasty. ? ?Subjective: ? ?Chief Complaint: right hip pain ? ?HPI: Erik Estrada, 71 y.o. male, has a history of pain and functional disability in the right hip(s) due to arthritis and patient has failed non-surgical conservative treatments for greater than 12 weeks to include NSAID's and/or analgesics, corticosteriod injections, viscosupplementation injections, flexibility and strengthening excercises, use of assistive devices, weight reduction as appropriate, and activity modification.  Onset of symptoms was gradual starting 5 years ago with gradually worsening course since that time.The patient noted no past surgery on the right hip(s).  Patient currently rates pain in the right hip at 10 out of 10 with activity. Patient has night pain, worsening of pain with activity and weight bearing, trendelenberg gait, pain that interfers with activities of daily living, and crepitus. Patient has evidence of subchondral cysts, subchondral sclerosis, periarticular osteophytes, and joint space narrowing by imaging studies. This condition presents safety issues increasing the risk of falls. There is no current active infection. ? ?Patient Active Problem List  ? Diagnosis Date Noted  ? Allergic sinusitis 04/12/2022  ? Rotator cuff tendinitis, right 02/03/2022  ? Primary osteoarthritis of right hip 06/24/2021  ? Primary osteoarthritis of left knee 03/29/2021  ? Hamstring strain, left, initial encounter 02/26/2021  ? 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 OA (osteoarthritis) of knee 08/16/2020  ? Neoplasm of uncertain behavior of skin 03/12/2020  ? Tubular adenoma of colon 10/17/2018  ? Hyperlipidemia 10/12/2018  ? Greater trochanteric bursitis of both hips 10/12/2018  ? Morbid obesity ([place]) 10/12/2018  ? Vitamin D deficiency 08/01/2017  ? 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 Family history of colon cancer requiring  screening colonoscopy 05/16/2012  ? Prediabetes 05/16/2012  ? Tobacco abuse 07/20/2011  ? Hypogonadism male 07/20/2011  ? Essential hypertension, benign 06/29/2011  ? ?Past Medical History:  ?Diagnosis Date  ? Anxiety   ? Arthritis   ? Essential hypertension, benign 06/29/2011  ? Hemorrhoids 08/01/2018  ? History of kidney stones   ? Morbid obesity ([place]) 10/12/2018  ? 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 Pre-diabetes   ? Testosterone deficiency 10/12/2018  ? Tubular adenoma of colon 10/17/2018  ? Colonoscopy 2016 at [facility] specialist.  Plan for 5-year recheck.  ? Vitamin D deficiency 08/01/2017  ?  ?Past Surgical History:  ?Procedure Laterality Date  ? KIDNEY STONE SURGERY    ? SHOULDER SURGERY  December 2010  ? Left  ? UMBILICAL HERNIA REPAIR    ?  ?No current facility-administered medications for this encounter.  ? ?Current Outpatient Medications  ?Medication Sig Dispense Refill Last Dose  ? cyclobenzaprine (FLEXERIL) 10 MG tablet Take 1 tablet (10 mg total) by mouth 3 (three) times daily as needed for muscle spasms. 30 tablet 0   ? HYDROcodone-acetaminophen (NORCO/VICODIN) 5-325 MG tablet Take 1 tablet by mouth every 8 (eight) hours as needed. 15 tablet 0   ? Ibuprofen 200 MG CAPS Take 400 mg by mouth in the morning and at bedtime. (Patient not taking: Reported on 04/12/2022)     ? lisinopril (ZESTRIL) 40 MG tablet Take 1 tablet (40 mg total) by mouth daily. 90 tablet 1   ? Vitamin D, Cholecalciferol, 1000 units TABS Take 4,000 Units by mouth daily. (Patient not taking: Reported on 04/12/2022)     ? mupirocin ointment (BACTROBAN) 2 % Apply 1 application topically 2 (two) times daily. 22 g 1 Not Taking  ? naproxen (  NAPROSYN) 500 MG tablet Take 1 tablet (500 mg total) by mouth 2 (two) times daily as needed. (Patient not taking: Reported on 04/12/2022) 30 tablet 1 Not Taking  ? predniSONE (DELTASONE) 50 MG tablet Take 1 tab po daily x5 days. 5 tablet 0   ? ?Allergies  ?Allergen Reactions  ? Hydrochlorothiazide  Other (See Comments)  ?  Fatigue  ? Lipitor [Atorvastatin Calcium] Other (See Comments)  ?  Myalgia  ?  ?Social History  ? ?Tobacco Use  ? Smoking status: Every Day  ?  Packs/day: 1.50  ?  Years: 22.00  ?  Pack years: 33.00  ?  Types: Cigarettes  ? Smokeless tobacco: Never  ?Substance Use Topics  ? Alcohol use: No  ?  ?Family History  ?Problem Relation Age of Onset  ? Colon cancer Brother   ? Hypertension Mother   ? Parkinson's disease Mother   ? Stroke Brother   ?  ? ?Review of Systems  ?Musculoskeletal:  Positive for arthralgias.  ?     Right hip  ?All other systems reviewed and are negative. ? ?Objective: ? ?Physical Exam ?Constitutional:   ?   Appearance: Normal appearance.  ?HENT:  ?   Head: Normocephalic.  ?   Nose: Nose normal.  ?   Mouth/Throat:  ?   Pharynx: Oropharynx is clear.  ?Eyes:  ?   Extraocular Movements: Extraocular movements intact.  ?Cardiovascular:  ?   Rate and Rhythm: Normal rate.  ?Pulmonary:  ?   Effort: Pulmonary effort is normal.  ?Abdominal:  ?   Palpations: Abdomen is soft.  ?Musculoskeletal:  ?   Cervical back: Normal range of motion.  ?   Comments: Right hip motion is limited and extremely painful with internal rotation.  Straight leg raise is negative.  His leg lengths are roughly equal.  His left knee motion is surprisingly good at 0-120?Marland Kitchen  He has medial greater than lateral joint line pain with crepitation and no effusion.  He has intact sensation and motor function in his feet with palpable pulses on both sides.  ?Skin: ?   General: Skin is warm and dry.  ?Neurological:  ?   General: No focal deficit present.  ?   Mental Status: He is alert and oriented to person, place, and time. Mental status is at baseline.  ?Psychiatric:     ?   Mood and Affect: Mood normal.     ?   Behavior: Behavior normal.     ?   Thought Content: Thought content normal.     ?   Judgment: Judgment normal.  ? ? ?Vital signs in last 24 hours: ?  ? ?Labs: ? ? ?Estimated body mass index is 37.76 kg/m? as  calculated from the following: ?  Height as of 04/12/22: '5\' 4"'$  (1.626 m). ?  Weight as of 04/12/22: 99.8 kg. ? ? ?Imaging Review ?Plain radiographs demonstrate severe degenerative joint disease of the right hip(s). The bone quality appears to be good for age and reported activity level. ? ? ? ? ? ?Assessment/Plan: ? ?End stage primary arthritis, right hip(s) ? ?The patient history, physical examination, clinical judgement of the provider and imaging studies are consistent with end stage degenerative joint disease of the right hip(s) and total hip arthroplasty is deemed medically necessary. The treatment options including medical management, injection therapy, arthroscopy and arthroplasty were discussed at length. The risks and benefits of total hip arthroplasty were presented and reviewed. The risks due to aseptic loosening, infection,  stiffness, dislocation/subluxation,  thromboembolic complications and other imponderables were discussed.  The patient acknowledged the explanation, agreed to proceed with the plan and consent was signed. Patient is being admitted for inpatient treatment for surgery, pain control, PT, OT, prophylactic antibiotics, VTE prophylaxis, progressive ambulation and ADL's and discharge planning.The patient is planning to be discharged home with home health services ?

## 2022-04-19 ENCOUNTER — Ambulatory Visit (HOSPITAL_COMMUNITY): Payer: Medicare Other

## 2022-04-19 ENCOUNTER — Ambulatory Visit (HOSPITAL_COMMUNITY)
Admission: RE | Admit: 2022-04-19 | Discharge: 2022-04-19 | Disposition: A | Discharge status: Home / Self Care | Payer: Medicare Other | Source: Ambulatory Visit | Attending: Orthopaedic Surgery | Admitting: Orthopaedic Surgery

## 2022-04-19 ENCOUNTER — Ambulatory Visit (HOSPITAL_COMMUNITY): Payer: Medicare Other | Admitting: Anesthesiology

## 2022-04-19 ENCOUNTER — Other Ambulatory Visit: Payer: Self-pay

## 2022-04-19 ENCOUNTER — Encounter (HOSPITAL_COMMUNITY)
Admission: RE | Disposition: A | Discharge status: Home / Self Care | Payer: Self-pay | Source: Ambulatory Visit | Attending: Orthopaedic Surgery

## 2022-04-19 ENCOUNTER — Ambulatory Visit (HOSPITAL_BASED_OUTPATIENT_CLINIC_OR_DEPARTMENT_OTHER): Payer: Medicare Other | Admitting: Anesthesiology

## 2022-04-19 ENCOUNTER — Encounter (HOSPITAL_COMMUNITY): Payer: Self-pay | Admitting: Orthopaedic Surgery

## 2022-04-19 DIAGNOSIS — Z01818 Encounter for other preprocedural examination: Secondary | ICD-10-CM

## 2022-04-19 DIAGNOSIS — I10 Essential (primary) hypertension: Secondary | ICD-10-CM | POA: Insufficient documentation

## 2022-04-19 DIAGNOSIS — E785 Hyperlipidemia, unspecified: Secondary | ICD-10-CM | POA: Diagnosis not present

## 2022-04-19 DIAGNOSIS — M1611 Unilateral primary osteoarthritis, right hip: Secondary | ICD-10-CM

## 2022-04-19 DIAGNOSIS — F17210 Nicotine dependence, cigarettes, uncomplicated: Secondary | ICD-10-CM | POA: Diagnosis not present

## 2022-04-19 DIAGNOSIS — E669 Obesity, unspecified: Secondary | ICD-10-CM | POA: Diagnosis not present

## 2022-04-19 DIAGNOSIS — Z96641 Presence of right artificial hip joint: Secondary | ICD-10-CM | POA: Diagnosis not present

## 2022-04-19 DIAGNOSIS — R7303 Prediabetes: Secondary | ICD-10-CM

## 2022-04-19 DIAGNOSIS — Z79899 Other long term (current) drug therapy: Secondary | ICD-10-CM | POA: Diagnosis not present

## 2022-04-19 DIAGNOSIS — Z6837 Body mass index (BMI) 37.0-37.9, adult: Secondary | ICD-10-CM | POA: Insufficient documentation

## 2022-04-19 DIAGNOSIS — Z471 Aftercare following joint replacement surgery: Secondary | ICD-10-CM | POA: Diagnosis not present

## 2022-04-19 HISTORY — PX: TOTAL HIP ARTHROPLASTY: SHX124

## 2022-04-19 LAB — GLUCOSE, CAPILLARY: Glucose-Capillary: 122 mg/dL — ABNORMAL HIGH (ref 70–99)

## 2022-04-19 LAB — ABO/RH: ABO/RH(D): O POS

## 2022-04-19 SURGERY — ARTHROPLASTY, HIP, TOTAL, ANTERIOR APPROACH
Anesthesia: Spinal | Site: Hip | Laterality: Right

## 2022-04-19 MED ORDER — ORAL CARE MOUTH RINSE: 15.0000 mL | Freq: Once | OROMUCOSAL | Status: AC

## 2022-04-19 MED ORDER — FENTANYL CITRATE (PF) 100 MCG/2ML IJ SOLN
INTRAMUSCULAR | Status: AC
  Filled 2022-04-19: qty 2

## 2022-04-19 MED ORDER — ACETAMINOPHEN 10 MG/ML IV SOLN
INTRAVENOUS | Status: AC
Start: 2022-04-19 — End: 2022-04-19
  Filled 2022-04-19: qty 100

## 2022-04-19 MED ORDER — ONDANSETRON HCL 4 MG/2ML IJ SOLN
INTRAMUSCULAR | Status: DC | PRN
  Administered 2022-04-19: 4 mg via INTRAVENOUS

## 2022-04-19 MED ORDER — PROPOFOL 10 MG/ML IV BOLUS
INTRAVENOUS | Status: DC | PRN
  Administered 2022-04-19 (×2): 20 mg via INTRAVENOUS

## 2022-04-19 MED ORDER — HYDROMORPHONE HCL 1 MG/ML IJ SOLN: 0.2500 mg | INTRAMUSCULAR | Status: DC | PRN

## 2022-04-19 MED ORDER — CHLORHEXIDINE GLUCONATE 0.12 % MT SOLN
15.0000 mL | Freq: Once | OROMUCOSAL | Status: AC
  Administered 2022-04-19: 15 mL via OROMUCOSAL

## 2022-04-19 MED ORDER — OXYCODONE HCL 5 MG/5ML PO SOLN: 5.0000 mg | Freq: Once | ORAL | Status: AC | PRN

## 2022-04-19 MED ORDER — METHOCARBAMOL 500 MG PO TABS
ORAL_TABLET | ORAL | Status: AC
  Filled 2022-04-19: qty 1

## 2022-04-19 MED ORDER — CEFAZOLIN SODIUM-DEXTROSE 2-4 GM/100ML-% IV SOLN
2.0000 g | INTRAVENOUS | Status: AC
  Administered 2022-04-19: 2 g via INTRAVENOUS
  Filled 2022-04-19: qty 100

## 2022-04-19 MED ORDER — METOCLOPRAMIDE HCL 5 MG PO TABS: 5.0000 mg | ORAL_TABLET | Freq: Three times a day (TID) | ORAL | Status: DC | PRN

## 2022-04-19 MED ORDER — OXYCODONE HCL 5 MG PO TABS
ORAL_TABLET | ORAL | Status: AC
  Filled 2022-04-19: qty 1

## 2022-04-19 MED ORDER — ONDANSETRON HCL 4 MG/2ML IJ SOLN: 4.0000 mg | Freq: Once | INTRAMUSCULAR | Status: DC | PRN

## 2022-04-19 MED ORDER — LACTATED RINGERS IV SOLN: INTRAVENOUS | Status: DC

## 2022-04-19 MED ORDER — BUPIVACAINE LIPOSOME 1.3 % IJ SUSP
INTRAMUSCULAR | Status: AC
  Filled 2022-04-19: qty 10

## 2022-04-19 MED ORDER — HYDROCODONE-ACETAMINOPHEN 7.5-325 MG PO TABS: 1.0000 | ORAL_TABLET | ORAL | Status: DC | PRN

## 2022-04-19 MED ORDER — METHOCARBAMOL 500 MG IVPB - SIMPLE MED: 500.0000 mg | Freq: Four times a day (QID) | INTRAVENOUS | Status: DC | PRN

## 2022-04-19 MED ORDER — TRANEXAMIC ACID 1000 MG/10ML IV SOLN
INTRAVENOUS | Status: DC | PRN
  Administered 2022-04-19: 2000 mg via TOPICAL

## 2022-04-19 MED ORDER — METHOCARBAMOL 500 MG PO TABS
500.0000 mg | ORAL_TABLET | Freq: Four times a day (QID) | ORAL | Status: DC | PRN
  Administered 2022-04-19: 500 mg via ORAL

## 2022-04-19 MED ORDER — PROPOFOL 500 MG/50ML IV EMUL
INTRAVENOUS | Status: DC | PRN
  Administered 2022-04-19: 130 ug/kg/min via INTRAVENOUS

## 2022-04-19 MED ORDER — BUPIVACAINE IN DEXTROSE 0.75-8.25 % IT SOLN
INTRATHECAL | Status: DC | PRN
  Administered 2022-04-19: 1.6 mL via INTRATHECAL

## 2022-04-19 MED ORDER — MIDAZOLAM HCL 5 MG/5ML IJ SOLN
INTRAMUSCULAR | Status: DC | PRN
  Administered 2022-04-19: 2 mg via INTRAVENOUS

## 2022-04-19 MED ORDER — BUPIVACAINE-EPINEPHRINE (PF) 0.25% -1:200000 IJ SOLN
INTRAMUSCULAR | Status: DC | PRN
  Administered 2022-04-19: 30 mL

## 2022-04-19 MED ORDER — ASPIRIN EC 81 MG PO TBEC: 81.0000 mg | DELAYED_RELEASE_TABLET | Freq: Two times a day (BID) | ORAL | 0 refills | Status: DC

## 2022-04-19 MED ORDER — OXYCODONE HCL 5 MG PO TABS
5.0000 mg | ORAL_TABLET | Freq: Once | ORAL | Status: AC | PRN
  Administered 2022-04-19: 5 mg via ORAL

## 2022-04-19 MED ORDER — ONDANSETRON HCL 4 MG PO TABS: 4.0000 mg | ORAL_TABLET | Freq: Four times a day (QID) | ORAL | Status: DC | PRN

## 2022-04-19 MED ORDER — MORPHINE SULFATE (PF) 2 MG/ML IV SOLN: 0.5000 mg | INTRAVENOUS | Status: DC | PRN

## 2022-04-19 MED ORDER — BUPIVACAINE-EPINEPHRINE (PF) 0.25% -1:200000 IJ SOLN
INTRAMUSCULAR | Status: AC
  Filled 2022-04-19: qty 30

## 2022-04-19 MED ORDER — OXYCODONE-ACETAMINOPHEN 5-325 MG PO TABS: 1.0000 | ORAL_TABLET | Freq: Four times a day (QID) | ORAL | 0 refills | Status: DC | PRN

## 2022-04-19 MED ORDER — ONDANSETRON HCL 4 MG/2ML IJ SOLN: 4.0000 mg | Freq: Four times a day (QID) | INTRAMUSCULAR | Status: DC | PRN

## 2022-04-19 MED ORDER — METOCLOPRAMIDE HCL 5 MG/ML IJ SOLN: 5.0000 mg | Freq: Three times a day (TID) | INTRAMUSCULAR | Status: DC | PRN

## 2022-04-19 MED ORDER — LACTATED RINGERS IV BOLUS
250.0000 mL | Freq: Once | INTRAVENOUS | Status: AC
  Administered 2022-04-19: 250 mL via INTRAVENOUS

## 2022-04-19 MED ORDER — PROPOFOL 10 MG/ML IV BOLUS
INTRAVENOUS | Status: AC
  Filled 2022-04-19: qty 20

## 2022-04-19 MED ORDER — KETOROLAC TROMETHAMINE 15 MG/ML IJ SOLN: 7.5000 mg | Freq: Four times a day (QID) | INTRAMUSCULAR | Status: DC

## 2022-04-19 MED ORDER — PHENYLEPHRINE HCL-NACL 20-0.9 MG/250ML-% IV SOLN
INTRAVENOUS | Status: DC | PRN
  Administered 2022-04-19: 50 ug/min via INTRAVENOUS

## 2022-04-19 MED ORDER — LACTATED RINGERS IV BOLUS
500.0000 mL | Freq: Once | INTRAVENOUS | Status: AC
  Administered 2022-04-19: 500 mL via INTRAVENOUS

## 2022-04-19 MED ORDER — HYDROCODONE-ACETAMINOPHEN 5-325 MG PO TABS: 1.0000 | ORAL_TABLET | ORAL | Status: DC | PRN

## 2022-04-19 MED ORDER — ACETAMINOPHEN 325 MG PO TABS: 325.0000 mg | ORAL_TABLET | Freq: Four times a day (QID) | ORAL | Status: DC | PRN

## 2022-04-19 MED ORDER — FENTANYL CITRATE (PF) 100 MCG/2ML IJ SOLN
INTRAMUSCULAR | Status: DC | PRN
Start: 2022-04-19 — End: 2022-04-19
  Administered 2022-04-19: 25 ug via INTRAVENOUS
  Administered 2022-04-19: 50 ug via INTRAVENOUS

## 2022-04-19 MED ORDER — TRANEXAMIC ACID 1000 MG/10ML IV SOLN
2000.0000 mg | INTRAVENOUS | Status: DC
  Filled 2022-04-19: qty 20

## 2022-04-19 MED ORDER — PHENYLEPHRINE 80 MCG/ML (10ML) SYRINGE FOR IV PUSH (FOR BLOOD PRESSURE SUPPORT)
PREFILLED_SYRINGE | INTRAVENOUS | Status: DC | PRN
  Administered 2022-04-19: 160 ug via INTRAVENOUS

## 2022-04-19 MED ORDER — ONDANSETRON HCL 4 MG/2ML IJ SOLN
INTRAMUSCULAR | Status: AC
  Filled 2022-04-19: qty 2

## 2022-04-19 MED ORDER — MIDAZOLAM HCL 2 MG/2ML IJ SOLN
INTRAMUSCULAR | Status: AC
  Filled 2022-04-19: qty 2

## 2022-04-19 MED ORDER — HYDROCODONE-ACETAMINOPHEN 5-325 MG PO TABS
ORAL_TABLET | ORAL | Status: AC
  Filled 2022-04-19: qty 1

## 2022-04-19 MED ORDER — DEXAMETHASONE SODIUM PHOSPHATE 10 MG/ML IJ SOLN
INTRAMUSCULAR | Status: DC | PRN
  Administered 2022-04-19: 10 mg via INTRAVENOUS

## 2022-04-19 MED ORDER — TIZANIDINE HCL 4 MG PO TABS: 4.0000 mg | ORAL_TABLET | Freq: Four times a day (QID) | ORAL | 1 refills | Status: DC | PRN

## 2022-04-19 MED ORDER — ACETAMINOPHEN 10 MG/ML IV SOLN
INTRAVENOUS | Status: DC | PRN
  Administered 2022-04-19: 1000 mg via INTRAVENOUS

## 2022-04-19 MED ORDER — 0.9 % SODIUM CHLORIDE (POUR BTL) OPTIME
TOPICAL | Status: DC | PRN
  Administered 2022-04-19: 1000 mL

## 2022-04-19 MED ORDER — CEFAZOLIN SODIUM-DEXTROSE 2-4 GM/100ML-% IV SOLN: 2.0000 g | Freq: Four times a day (QID) | INTRAVENOUS | Status: DC

## 2022-04-19 MED ORDER — BUPIVACAINE LIPOSOME 1.3 % IJ SUSP: 10.0000 mL | Freq: Once | INTRAMUSCULAR | Status: DC

## 2022-04-19 MED ORDER — POVIDONE-IODINE 10 % EX SWAB
2.0000 "application " | Freq: Once | CUTANEOUS | Status: AC
  Administered 2022-04-19: 2 via TOPICAL

## 2022-04-19 MED ORDER — TRANEXAMIC ACID-NACL 1000-0.7 MG/100ML-% IV SOLN: 1000.0000 mg | Freq: Once | INTRAVENOUS | Status: DC

## 2022-04-19 MED ORDER — ACETAMINOPHEN 500 MG PO TABS: 500.0000 mg | ORAL_TABLET | Freq: Four times a day (QID) | ORAL | Status: DC

## 2022-04-19 MED ORDER — BUPIVACAINE LIPOSOME 1.3 % IJ SUSP
INTRAMUSCULAR | Status: DC | PRN
  Administered 2022-04-19: 10 mL

## 2022-04-19 MED ORDER — DEXAMETHASONE SODIUM PHOSPHATE 10 MG/ML IJ SOLN
INTRAMUSCULAR | Status: AC
  Filled 2022-04-19: qty 1

## 2022-04-19 MED ORDER — TRANEXAMIC ACID-NACL 1000-0.7 MG/100ML-% IV SOLN
1000.0000 mg | INTRAVENOUS | Status: AC
  Administered 2022-04-19: 1000 mg via INTRAVENOUS
  Filled 2022-04-19: qty 100

## 2022-04-19 SURGICAL SUPPLY — 48 items
BAG COUNTER SPONGE SURGICOUNT (BAG) ×1 IMPLANT
BAG DECANTER FOR FLEXI CONT (MISCELLANEOUS) ×2 IMPLANT
BAG SPNG CNTER NS LX DISP (BAG) ×1
BLADE SAW SGTL 18X1.27X75 (BLADE) ×2 IMPLANT
BOOTIES KNEE HIGH SLOAN (MISCELLANEOUS) ×2 IMPLANT
CELLS DAT CNTRL 66122 CELL SVR (MISCELLANEOUS) ×1 IMPLANT
COVER PERINEAL POST (MISCELLANEOUS) ×2 IMPLANT
COVER SURGICAL LIGHT HANDLE (MISCELLANEOUS) ×2 IMPLANT
CUP ACETABULAR GRIPTON 100 52 (Orthopedic Implant) IMPLANT
DRAPE FOOT SWITCH (DRAPES) ×2 IMPLANT
DRAPE IMP U-DRAPE 54X76 (DRAPES) ×2 IMPLANT
DRAPE STERI IOBAN 125X83 (DRAPES) ×2 IMPLANT
DRAPE U-SHAPE 47X51 STRL (DRAPES) ×4 IMPLANT
DRSG AQUACEL AG ADV 3.5X 6 (GAUZE/BANDAGES/DRESSINGS) ×2 IMPLANT
DURAPREP 26ML APPLICATOR (WOUND CARE) ×2 IMPLANT
ELECT BLADE TIP CTD 4 INCH (ELECTRODE) ×2 IMPLANT
ELECT REM PT RETURN 15FT ADLT (MISCELLANEOUS) ×2 IMPLANT
ELIMINATOR HOLE APEX DEPUY (Hips) ×1 IMPLANT
FEM STEM 12/14 TAPER SZ 4 HIP (Orthopedic Implant) ×2 IMPLANT
FEMORAL STEM 12/14 TPR SZ4 HIP (Orthopedic Implant) IMPLANT
GLOVE BIO SURGEON STRL SZ8 (GLOVE) ×4 IMPLANT
GLOVE BIOGEL PI IND STRL 8 (GLOVE) ×2 IMPLANT
GLOVE BIOGEL PI INDICATOR 8 (GLOVE) ×2
GOWN STRL REUS W/ TWL XL LVL3 (GOWN DISPOSABLE) ×2 IMPLANT
GOWN STRL REUS W/TWL XL LVL3 (GOWN DISPOSABLE) ×4
GRIPTON 100 52 (Orthopedic Implant) ×2 IMPLANT
HEAD M SROM 36MM 2 (Hips) IMPLANT
HOLDER FOLEY CATH W/STRAP (MISCELLANEOUS) ×2 IMPLANT
KIT TURNOVER KIT A (KITS) IMPLANT
LINER ACETAB NEUTRAL 36ID 520D (Liner) ×1 IMPLANT
MANIFOLD NEPTUNE II (INSTRUMENTS) ×2 IMPLANT
NEEDLE HYPO 22GX1.5 SAFETY (NEEDLE) ×2 IMPLANT
NS IRRIG 1000ML POUR BTL (IV SOLUTION) ×2 IMPLANT
PACK ANTERIOR HIP CUSTOM (KITS) ×2 IMPLANT
PENCIL SMOKE EVACUATOR (MISCELLANEOUS) IMPLANT
PROTECTOR NERVE ULNAR (MISCELLANEOUS) ×2 IMPLANT
RETRACTOR WND ALEXIS 18 MED (MISCELLANEOUS) ×1 IMPLANT
RTRCTR WOUND ALEXIS 18CM MED (MISCELLANEOUS) ×2
SPIKE FLUID TRANSFER (MISCELLANEOUS) ×1 IMPLANT
SROM M HEAD 36MM 2 (Hips) ×2 IMPLANT
SUT ETHIBOND NAB CT1 #1 30IN (SUTURE) ×4 IMPLANT
SUT VIC AB 1 CT1 36 (SUTURE) ×2 IMPLANT
SUT VIC AB 2-0 CT1 27 (SUTURE) ×2
SUT VIC AB 2-0 CT1 TAPERPNT 27 (SUTURE) ×1 IMPLANT
SUT VICRYL AB 3-0 FS1 BRD 27IN (SUTURE) ×2 IMPLANT
SUT VLOC 180 0 24IN GS25 (SUTURE) ×2 IMPLANT
SYR 50ML LL SCALE MARK (SYRINGE) ×2 IMPLANT
TRAY FOLEY MTR SLVR 16FR STAT (SET/KITS/TRAYS/PACK) ×2 IMPLANT

## 2022-04-19 NOTE — Anesthesia Procedure Notes (Signed)
Spinal ? ?Patient location during procedure: OR ?Start time: 04/19/2022 9:43 AM ?End time: 04/19/2022 9:45 AM ?Reason for block: surgical anesthesia ?Staffing ?Performed: resident/CRNA  ?Anesthesiologist: Josephine Igo, MD ?Resident/CRNA: Lind Covert, CRNA ?Preanesthetic Checklist ?Completed: patient identified, IV checked, site marked, risks and benefits discussed, surgical consent, monitors and equipment checked, pre-op evaluation and timeout performed ?Spinal Block ?Patient position: sitting ?Prep: DuraPrep ?Patient monitoring: heart rate, cardiac monitor, continuous pulse ox and blood pressure ?Approach: midline ?Location: L3-4 ?Injection technique: single-shot ?Needle ?Needle type: Pencan  ?Needle gauge: 24 G ?Needle length: 10 cm ?Needle insertion depth: 7 cm ?Assessment ?Sensory level: T6 ?Events: CSF return ?Additional Notes ?Timeout performed. Patient  to sitting position. L3-4 identified. SAB without difficulty. ? ? ? ?

## 2022-04-19 NOTE — Interval H&P Note (Signed)
History and Physical Interval Note: ? ?04/19/2022 ?8:50 AM ? ?Erik Estrada  has presented today for surgery, with the diagnosis of RIGHT HIP DEGENERATIVE JOINT DISEASE.  The various methods of treatment have been discussed with the patient and family. After consideration of risks, benefits and other options for treatment, the patient has consented to  Procedure(s): ?RIGHT TOTAL HIP ARTHROPLASTY ANTERIOR APPROACH (Right) as a surgical intervention.  The patient's history has been reviewed, patient examined, no change in status, stable for surgery.  I have reviewed the patient's chart and labs.  Questions were answered to the patient's satisfaction.   ? ? ?Monico Blitz Erik Estrada ? ? ?

## 2022-04-19 NOTE — Op Note (Signed)
PRE-OP DIAGNOSIS:  RIGHT HIP DEGENERATIVE JOINT DISEASE ?POST-OP DIAGNOSIS:  same ?PROCEDURE: RIGHT TOTAL HIP ARTHROPLASTY ANTERIOR APPROACH ?ANESTHESIA:  Spinal and MAC ?SURGEON:  Melrose Nakayama MD ?ASSISTANT:  Loni Dolly PA-C ? ? ?INDICATIONS FOR PROCEDURE:  The patient is a 71 y.o. male with a long history of a painful hip.  This has persisted despite multiple conservative measures.  The patient has persisted with pain and dysfunction making rest and activity difficult.  A total hip replacement is offered as surgical treatment.  Informed operative consent was obtained after discussion of possible complications including reaction to anesthesia, infection, neurovascular injury, dislocation, DVT, PE, and death.  The importance of the postoperative rehab program to optimize result was stressed with the patient. ? ?SUMMARY OF FINDINGS AND PROCEDURE:  Under the above anesthesia through a anterior approach an the Hana table a right THR was performed.  The patient had severe degenerative change and good bone quality.  We used DePuy components to replace the hip and these were size 4 high offset Actis femur capped with a -2 33m metal hip ball.  On the acetabular side we used a size 52 Gription shell with a  plus 0 neutral polyethylene liner.  We did use a hole eliminator.  ALoni DollyPA-C assisted throughout and was invaluable to the completion of the case in that he helped position and retract while I performed the procedure.  He also closed simultaneously to help minimize OR time.  I used fluoroscopy throughout the case to check position of implants and leg lengths and read all of these views myself. ? ?DESCRIPTION OF PROCEDURE:  The patient was taken to the OR suite where the above anesthetic was applied.  The patient was then positioned on the Hana table supine.  All bony prominences were appropriately padded.  Prep and drape was then performed in normal sterile fashion.  The patient was given kefzol preoperative  antibiotic and an appropriate time out was performed.  We then took an anterior approach to the right hip.  Dissection was taken through adipose to the tensor fascia lata fascia.  This structure was incised longitudinally and we dissected in the intermuscular interval just medial to this muscle.  Cobra retractors were placed superior and inferior to the femoral neck superficial to the capsule.  A capsular incision was then made and the retractors were placed along the femoral neck.  Xray was brought in to get a good level for the femoral neck cut which was made with an oscillating saw and osteotome.  The femoral head was removed with a corkscrew.  The acetabulum was exposed and some labral tissues were excised. Reaming was taken to the inside wall of the pelvis and sequentially up to 1 mm smaller than the actual component.  A trial of components was done and then the aforementioned acetabular shell was placed in appropriate tilt and anteversion confirmed by fluoroscopy. The liner was placed along with the hole eliminator and attention was turned to the femur.  The leg was brought down and over into adduction and the elevator bar was used to raise the femur up gently in the wound.  The piriformis was released with care taken to preserve the obturator internus attachment and all of the posterior capsule. The femur was reamed and then broached to the appropriate size.  A trial reduction was done and the aforementioned head and neck assembly gave uKoreathe best stability in extension with external rotation.  Leg lengths were felt to be  about equal by fluoroscopic exam.  The trial components were removed and the wound irrigated.  We then placed the femoral component in appropriate anteversion.  The head was applied to a dry stem neck and the hip again reduced.  It was again stable in the aforementioned position.  The would was irrigated again followed by re-approximation of anterior capsule with ethibond suture. Tensor  fascia was repaired with V-loc suture  followed by deep closure with #O and #2 undyed vicryl.  Skin was closed with subQ stitch and steristrips followed by a sterile dressing.  EBL and IOF can be obtained from anesthesia records. ? ?DISPOSITION:  The patient was taken to PACU in stable condition to potentially go home same day depending on ability to walk and tolerate liquids.     ?

## 2022-04-19 NOTE — Anesthesia Postprocedure Evaluation (Signed)
Anesthesia Post Note ? ?Patient: Erik Estrada ? ?Procedure(s) Performed: RIGHT TOTAL HIP ARTHROPLASTY ANTERIOR APPROACH (Right: Hip) ? ?  ? ?Patient location during evaluation: PACU ?Anesthesia Type: Spinal ?Level of consciousness: oriented and awake and alert ?Pain management: pain level controlled ?Vital Signs Assessment: post-procedure vital signs reviewed and stable ?Respiratory status: spontaneous breathing, respiratory function stable and nonlabored ventilation ?Cardiovascular status: blood pressure returned to baseline and stable ?Postop Assessment: no headache, no backache, no apparent nausea or vomiting, spinal receding and patient able to bend at knees ?Anesthetic complications: no ? ? ?No notable events documented. ? ?Last Vitals:  ?Vitals:  ? 04/19/22 1145 04/19/22 1200  ?BP: (!) 108/59 123/64  ?Pulse: 71 67  ?Resp: 12 12  ?Temp:    ?SpO2: 96% 99%  ?  ?Last Pain:  ?Vitals:  ? 04/19/22 1130  ?TempSrc:   ?PainSc: Asleep  ? ? ?  ?  ?  ?  ?  ?  ? ?Dacari Beckstrand A. ? ? ? ? ?

## 2022-04-19 NOTE — Discharge Instructions (Signed)

## 2022-04-19 NOTE — Transfer of Care (Signed)
Immediate Anesthesia Transfer of Care Note ? ?Patient: Erik Estrada ? ?Procedure(s) Performed: RIGHT TOTAL HIP ARTHROPLASTY ANTERIOR APPROACH (Right: Hip) ? ?Patient Location: PACU ? ?Anesthesia Type:Spinal ? ?Level of Consciousness: sedated ? ?Airway & Oxygen Therapy: Patient Spontanous Breathing and Patient connected to face mask oxygen ? ?Post-op Assessment: Report given to RN and Post -op Vital signs reviewed and stable ? ?Post vital signs: Reviewed and stable ? ?Last Vitals:  ?Vitals Value Taken Time  ?BP 152/135 04/19/22 1115  ?Temp    ?Pulse 86 04/19/22 1117  ?Resp 25 04/19/22 1117  ?SpO2 100 % 04/19/22 1117  ?Vitals shown include unvalidated device data. ? ?Last Pain:  ?Vitals:  ? 04/19/22 0812  ?TempSrc:   ?PainSc: 0-No pain  ?   ? ?Patients Stated Pain Goal: 3 (04/19/22 7939) ? ?Complications: No notable events documented. ?

## 2022-04-19 NOTE — Anesthesia Preprocedure Evaluation (Signed)
Anesthesia Evaluation  ?Patient identified by MRN, date of birth, ID band ?Patient awake ? ? ? ?Reviewed: ?Allergy & Precautions, NPO status , Patient's Chart, lab work & pertinent test results, reviewed documented beta blocker date and time  ? ?Airway ?Mallampati: II ? ?TM Distance: >3 FB ?Neck ROM: Full ? ? ? Dental ? ?(+) Teeth Intact, Caps, Dental Advisory Given ?  ?Pulmonary ?Current Smoker and Patient abstained from smoking.,  ?  ?Pulmonary exam normal ?breath sounds clear to auscultation ? ? ? ? ? ? Cardiovascular ?hypertension, Pt. on medications ?Normal cardiovascular exam ?Rhythm:Regular Rate:Normal ? ?EKG 04/08/22 ?NSR, LVH ?  ?Neuro/Psych ?PSYCHIATRIC DISORDERS Anxiety negative neurological ROS ?   ? GI/Hepatic ?negative GI ROS, Neg liver ROS,   ?Endo/Other  ?Hypgonadism ?Obesity ?Hyperlipidemia ? Renal/GU ?negative Renal ROS  ?negative genitourinary ?  ?Musculoskeletal ? ?(+) Arthritis , Osteoarthritis,  Right hip DJD  ? Abdominal ?(+) + obese,   ?Peds ? Hematology ?negative hematology ROS ?(+)   ?Anesthesia Other Findings ? ? Reproductive/Obstetrics ? ?  ? ? ? ? ? ? ? ? ? ? ? ? ? ?  ?  ? ? ? ? ? ? ? ? ?Anesthesia Physical ?Anesthesia Plan ? ?ASA: 2 ? ?Anesthesia Plan: Spinal  ? ?Post-op Pain Management: Ofirmev IV (intra-op)*  ? ?Induction: Intravenous ? ?PONV Risk Score and Plan: 1 and Treatment may vary due to age or medical condition, Propofol infusion and Ondansetron ? ?Airway Management Planned: Natural Airway and Simple Face Mask ? ?Additional Equipment: None ? ?Intra-op Plan:  ? ?Post-operative Plan:  ? ?Informed Consent: I have reviewed the patients History and Physical, chart, labs and discussed the procedure including the risks, benefits and alternatives for the proposed anesthesia with the patient or authorized representative who has indicated his/her understanding and acceptance.  ? ? ? ?Dental advisory given ? ?Plan Discussed with: CRNA and  Anesthesiologist ? ?Anesthesia Plan Comments:   ? ? ? ? ? ? ?Anesthesia Quick Evaluation ? ?

## 2022-04-19 NOTE — Evaluation (Signed)
Physical Therapy Evaluation ?Patient Details ?Name: Erik Estrada ?MRN: [id number] ?DOB: [birth year] ?Today's Date: 04/19/2022 ? ?History of Present Illness ? Pt is a 71yo male presenting s/p R-THA, AA on 04/19/22. PMH: OA, HTN.  ?Clinical Impression ? Jacoby Zanni is a 71 y.o. male POD 0 s/p R-THA, AA. Patient reports independence with mobility at baseline. Patient is now limited by functional impairments (see PT problem list below) and requires min guard for transfers and gait with RW. Patient was able to ambulate 60 feet with RW and min guard and cues for safe walker management. Patient instructed in exercises to facilitate ROM and circulation. Patient will benefit from continued skilled PT interventions to address impairments and progress towards PLOF. Patient has met mobility goals at adequate level for discharge home; will continue to follow if pt continues acute stay to progress towards Mod I goals. ?   ?   ? ?Recommendations for follow up therapy are one component of a multi-disciplinary discharge planning process, led by the attending physician.  Recommendations may be updated based on patient status, additional functional criteria and insurance authorization. ? ?Follow Up Recommendations Follow physician's recommendations for discharge plan and follow up therapies ? ?  ?Assistance Recommended at Discharge Intermittent Supervision/Assistance  ?Patient can return home with the following ? A little help with walking and/or transfers;A little help with bathing/dressing/bathroom;Assistance with cooking/housework;Assist for transportation ? ?  ?Equipment Recommendations Rolling walker (2 wheels)  ?Recommendations for Other Services ?    ?  ?Functional Status Assessment Patient has had a recent decline in their functional status and demonstrates the ability to make significant improvements in function in a reasonable and predictable amount of time.  ? ?  ?Precautions / Restrictions Precautions ?Precautions:  Fall ?Restrictions ?Weight Bearing Restrictions: No ?Other Position/Activity Restrictions: WBAT  ? ?  ? ?Mobility ? Bed Mobility ?Overal bed mobility: Needs Assistance ?Bed Mobility: Supine to Sit ?  ?  ?Supine to sit: Min assist ?  ?  ?General bed mobility comments: Min assist for bringing RLE off bed and elevating trunk ?  ? ?Transfers ?Overall transfer level: Needs assistance ?Equipment used: Rolling walker (2 wheels) ?Transfers: Sit to/from Stand ?Sit to Stand: Min guard ?  ?  ?  ?  ?  ?General transfer comment: For safety only, no physical assist required. ?  ? ?Ambulation/Gait ?Ambulation/Gait assistance: Min guard ?Gait Distance (Feet): 60 Feet ?Assistive device: Rolling walker (2 wheels) ?Gait Pattern/deviations: Step-to pattern ?Gait velocity: decreased ?  ?  ?General Gait Details: Pt ambulated with RW and min guard assist, no physical assist required or overt LOB noted. Pt demonstrated step-to pattern. Recliner follow for safety. ? ?Stairs ?  ?  ?  ?  ?  ? ?Wheelchair Mobility ?  ? ?Modified Rankin (Stroke Patients Only) ?  ? ?  ? ?Balance Overall balance assessment: Needs assistance ?Sitting-balance support: Feet supported, No upper extremity supported ?Sitting balance-Leahy Scale: Good ?  ?  ?Standing balance support: Reliant on assistive device for balance, During functional activity, Bilateral upper extremity supported ?Standing balance-Leahy Scale: Poor ?  ?  ?  ?  ?  ?  ?  ?  ?  ?  ?  ?  ?   ? ? ? ?Pertinent Vitals/Pain Pain Assessment ?Pain Assessment: 0-10 ?Pain Score: 8  ?Pain Location: r hip ?Pain Descriptors / Indicators: Operative site guarding ?Pain Intervention(s): Monitored during session, Repositioned, Ice applied, Limited activity within patient's tolerance  ? ? ?Home Living Family/patient expects to be discharged  to:: Private residence ?Living Arrangements: Spouse/significant other ?Available Help at Discharge: Family;Available PRN/intermittently (Wife and grandson) ?Type of Home:  House ?Home Access: Level entry ?  ?  ?  ?Home Layout: Two level;Able to live on main level with bedroom/bathroom;Full bath on main level ?Home Equipment: Kasandra Knudsen - single point ?   ?  ?Prior Function Prior Level of Function : Independent/Modified Independent ?  ?  ?  ?  ?  ?  ?Mobility Comments: SPC at all times ?ADLs Comments: IND except for shoes and socks, cannot reach to toes ?  ? ? ?Hand Dominance  ?   ? ?  ?Extremity/Trunk Assessment  ? Upper Extremity Assessment ?Upper Extremity Assessment: Overall WFL for tasks assessed ?  ? ?Lower Extremity Assessment ?Lower Extremity Assessment: RLE deficits/detail;LLE deficits/detail ?RLE Deficits / Details: MMT ank DF/PF 4/5 ?RLE Sensation: WNL ?LLE Deficits / Details: MMT ank DF/PF 4/5 ?LLE Sensation: WNL ?  ? ?Cervical / Trunk Assessment ?Cervical / Trunk Assessment: Normal  ?Communication  ? Communication: No difficulties  ?Cognition Arousal/Alertness: Awake/alert ?Behavior During Therapy: [facility] for tasks assessed/performed ?Overall Cognitive Status: Within Functional Limits for tasks assessed ?  ?  ?  ?  ?  ?  ?  ?  ?  ?  ?  ?  ?  ?  ?  ?  ?  ?  ?  ? ?  ?General Comments   ? ?  ?Exercises Total Joint Exercises ?Ankle Circles/Pumps: AROM, Both, 10 reps ?Quad Sets: AROM, Right, Other reps (comment) (2) ?Short Arc Quad: AROM, Right, Other reps (comment) (2) ?Heel Slides: AAROM, Right, Other reps (comment) (3) ?Hip ABduction/ADduction: AAROM, Right, Other reps (comment) (3) ?Marching in Standing: AROM, Both, Other reps (comment) (3)  ? ?Assessment/Plan  ?  ?PT Assessment Patient needs continued PT services  ?PT Problem List Decreased strength;Decreased range of motion;Decreased activity tolerance;Decreased balance;Decreased mobility;Decreased coordination;Pain ? ?   ?  ?PT Treatment Interventions DME instruction;Gait training;Stair training;Functional mobility training;Therapeutic activities;Therapeutic exercise;Balance training;Neuromuscular re-education;Patient/family  education   ? 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 ?  ?Frequency 7X/week ?  ? ? ?Co-evaluation   ?  ?  ?  ?  ? ? ?  ?AM-PAC PT "6 Clicks" Mobility  ?Outcome Measure Help needed turning from your back to your side while in a flat bed without using bedrails?: None ?Help needed moving from lying on your back to sitting on the side of a flat bed without using bedrails?: None ?Help needed moving to and from a bed to a chair (including a wheelchair)?: A Little ?Help needed standing up from a chair using your arms (e.g., wheelchair or bedside chair)?: A Little ?Help needed to walk in hospital room?: A Little ?Help needed climbing 3-5 steps with a railing? : A Lot ?6 Click Score: 19 ? ?  ?End of Session Equipment Utilized During Treatment: Gait belt ?Activity Tolerance: Patient tolerated treatment well;No increased pain ?Patient left: in chair;with call bell/phone within reach ?Nurse Communication: Mobility status ?PT Visit Diagnosis: Difficulty in walking, not elsewhere classified (R26.2) ?  ? ?Time: 5631-4970 ?PT Time Calculation (min) (ACUTE ONLY): 38 min ? ? ?Charges:   PT Evaluation ?$PT Eval Low Complexity: 1 Low ?PT Treatments ?$Gait Training: 8-22 mins ?$Therapeutic Exercise: 8-22 mins ?  ?   ?  Coolidge Breeze, PT, DPT ?[facility] ?Office: [phone] ?Pager: [phone] ? ?Coolidge Breeze ?04/19/2022, 2:59 PM ?

## 2022-04-20 ENCOUNTER — Encounter (HOSPITAL_COMMUNITY): Payer: Self-pay | Admitting: Orthopaedic Surgery

## 2022-04-21 ENCOUNTER — Encounter: Payer: Self-pay | Admitting: Rehabilitative and Restorative Service Providers"

## 2022-04-21 ENCOUNTER — Ambulatory Visit: Payer: Medicare Other | Attending: Orthopaedic Surgery | Admitting: Rehabilitative and Restorative Service Providers"

## 2022-04-21 DIAGNOSIS — M25551 Pain in right hip: Secondary | ICD-10-CM | POA: Diagnosis not present

## 2022-04-21 DIAGNOSIS — R293 Abnormal posture: Secondary | ICD-10-CM | POA: Insufficient documentation

## 2022-04-21 DIAGNOSIS — R600 Localized edema: Secondary | ICD-10-CM | POA: Insufficient documentation

## 2022-04-21 DIAGNOSIS — M6281 Muscle weakness (generalized): Secondary | ICD-10-CM | POA: Insufficient documentation

## 2022-04-21 DIAGNOSIS — R269 Unspecified abnormalities of gait and mobility: Secondary | ICD-10-CM | POA: Diagnosis not present

## 2022-04-21 DIAGNOSIS — R29898 Other symptoms and signs involving the musculoskeletal system: Secondary | ICD-10-CM | POA: Insufficient documentation

## 2022-04-21 DIAGNOSIS — Z96649 Presence of unspecified artificial hip joint: Secondary | ICD-10-CM | POA: Diagnosis not present

## 2022-04-21 DIAGNOSIS — M25651 Stiffness of right hip, not elsewhere classified: Secondary | ICD-10-CM | POA: Diagnosis not present

## 2022-04-21 NOTE — Patient Instructions (Signed)
Access Code: [facility] ?URL: [email or web address] ?Date: 04/21/2022 ?Prepared by: Gillermo Murdoch ? ?Exercises ?- Supine Quad Set  - 2 x daily - 7 x weekly - 2 sets - 10 reps - 5 sec hold ?- Supine Heel Slide with Strap  - 2 x daily - 7 x weekly - 1 sets - 10 reps ?- Supine Gluteal Sets  - 2 x daily - 7 x weekly - 1 sets - 10 reps - 5 sec  hold ?- Supine Ankle Pumps  - 2 x daily - 7 x weekly - 1 sets - 3 reps - 30 sec  hold ?- Seated Ankle Circles  - 2 x daily - 7 x weekly - 1-2 sets - 10 reps - 2-3 sec  hold ?- Ankle Alphabet in Elevation  - 2 x daily - 7 x weekly - 1 sets - 3 reps ?

## 2022-04-21 NOTE — Therapy (Signed)
[place] ?[facility] ?[address] ?[place], [place], [address] ?Phone: [phone]   Fax:  [phone] ? ?Physical Therapy Evaluation ? ?Patient Details  ?Name: Erik Estrada ?MRN: [id number] ?Date of Birth: [birth year] ?Referring Provider (PT): Dr Melrose Nakayama ? ? ?Encounter Date: 04/21/2022 ? ? PT End of Session - 04/21/22 1111   ? ? Visit Number 1   ? Number of Visits 16   ? Date for PT Re-Evaluation 06/16/22   ? Authorization Type Medicare   ? Authorization - Visit Number 1   ? Progress Note Due on Visit 10   ? PT Start Time 1100   ? PT Stop Time 1145   ? PT Time Calculation (min) 45 min   ? Activity Tolerance Patient tolerated treatment well;Patient limited by pain;No increased pain   ? ?  ?  ? ?  ? ? ?Past Medical History:  ?Diagnosis Date  ? Anxiety   ? Arthritis   ? Essential hypertension, benign 06/29/2011  ? Hemorrhoids 08/01/2018  ? History of kidney stones   ? Morbid obesity ([place]) 10/12/2018  ? 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 Pre-diabetes   ? Testosterone deficiency 10/12/2018  ? Tubular adenoma of colon 10/17/2018  ? Colonoscopy 2016 at [facility].  Plan for 5-year recheck.  ? Vitamin D deficiency 08/01/2017  ? ? ?Past Surgical History:  ?Procedure Laterality Date  ? KIDNEY STONE SURGERY    ? SHOULDER SURGERY  December 2010  ? Left  ? TOTAL HIP ARTHROPLASTY Right 04/19/2022  ? Procedure: RIGHT TOTAL HIP ARTHROPLASTY ANTERIOR APPROACH;  Surgeon: Melrose Nakayama, MD;  Location: [facility];  Service: Orthopedics;  Laterality: Right;  ? UMBILICAL HERNIA REPAIR    ? ? ?There were no vitals filed for this visit. ? ? ? Subjective Assessment - 04/21/22 1114   ? ? Subjective Rt reports that he had Rt THA anterior approach 04/19/22 and was home the day of surgery. He has had pain in the Rt hip and Lt knee since surgery.   ? How long can you sit comfortably? -   ? How long can you stand comfortably? -   ? How long can you walk comfortably? -   ?  Patient Stated Goals use Rt hip and leg normally without pain   ? Currently in Pain? Yes   ? Pain Score 3    ? Pain Location Hip   ? Pain Orientation Right   ? Pain Descriptors / Indicators Aching   ? Pain Type Surgical pain   ? Pain Onset More than a month ago   ? Pain Frequency Intermittent   ? Aggravating Factors  movement; standing; walking   ? Pain Relieving Factors medication; ice   ? ?  ?  ? ?  ? ? ? ? ? [facility] PT Assessment - 04/21/22 0001   ? ?  ? Assessment  ? Medical Diagnosis Rt THA   ? Referring Provider (PT) Dr Melrose Nakayama   ? Onset Date/Surgical Date 04/19/22   pain and arthritis x ~ 2 years  ? Hand Dominance Right   ? Next MD Visit 04/29/22   ? Prior Therapy L shoulder; Lt knee and Rt hip   ?  ? Precautions  ? Precautions Anterior Hip   ?  ? Restrictions  ? Weight Bearing Restrictions No   ?  ? Balance Screen  ? Has the patient fallen in the past 6 months No   ? Has the patient had a  decrease in activity level because of a fear of falling?  No   ? Is the patient reluctant to leave their home because of a fear of falling?  No   ?  ? Home Environment  ? Living Environment Private residence   ? Living Arrangements Spouse/significant other   ? Additional Comments level entry   ?  ? Prior Function  ? Level of Independence Independent   ? Vocation Part time employment   ? Vocation Requirements Works in Environmental manager -- stands on concrete. Pt works 4 days/wk, 3-4 hrs/day.   ? Leisure Shag dancing   ?  ? Observation/Other Assessments  ? Focus on Therapeutic Outcomes (FOTO)  38   ?  ? Sensation  ? Additional Comments WF's except Rt lateral thigh post surgery   ?  ? AROM  ? Right Knee Extension -5   ? Right Knee Flexion 108   pt assisting with green strap  ? Left Knee Extension -12   ? Left Knee Flexion 110   ?  ? Strength  ? Overall Strength Comments functional strength not tested resistively   ?  ? Bed Mobility  ? Bed Mobility --   difficulty with all bed mobility and transfers requires assistance for moving  Rt LE  ? ?  ?  ? ?  ? ? ? ? ? ? ? ? ? ? ? ? ? ?Objective measurements completed on examination: See above findings.  ? ? ? ? ? [place] Adult PT Treatment/Exercise - 04/21/22 0001   ? ?  ? Transfers  ? Comments working on sit</> supine with assist for Rt LE as needed   ?  ? Ambulation/Gait  ? Ambulation/Gait Assistance 6: Modified independent (Device/Increase time)   ? Ambulation Distance (Feet) 80 Feet   x 2  ? Assistive device Rolling walker   ? Gait Pattern Step-through pattern;Decreased stance time - right;Decreased stride length;Decreased weight shift to left;Right circumduction;Decreased trunk rotation;Trunk flexed   ? Ambulation Surface Level   ?  ? Self-Care  ? ADL's assisted patient and wife with car transfer with walker - discussed home transfers   ? Posture worked on standing posture and alignment with trunk,hip and knee extension with rolling walker for UE support   ?  ? Knee/Hip Exercises: Standing  ? Other Standing Knee Exercises weight shift L<>R x10 with rolling walker   ? Other Standing Knee Exercises working on standing posture chest up, hips forward   ?  ? Knee/Hip Exercises: Supine  ? Quad Sets Strengthening;Right;5 reps   5 sec hold  ? Quad Sets Limitations working to straighten Rt knee to pt tolerance   ? Heel Slides AAROM;Right;5 reps   ? Heel Slides Limitations with strap to assist with heel slide/knee flexion   ? Other Supine Knee/Hip Exercises ankle pumps/circles CW and CCW/alphabet   ? ?  ?  ? ?  ? ? ? ? ? ? ? ? ? ? PT Education - 04/21/22 1141   ? ? Education Details POC HEP   ? Person(s) Educated Patient   ? Methods Explanation;Demonstration;Tactile cues;Verbal cues;Handout   ? Comprehension Verbalized understanding;Returned demonstration;Verbal cues required;Tactile cues required   ? ?  ?  ? ?  ? ? ? PT Short Term Goals - 04/21/22 1520   ? ?  ? PT SHORT TERM GOAL #1  ? Title Pt will be independent with initial HEP   ? Time 4   ? Period Weeks   ? Status  New   ? Target Date 05/19/22   ?  ? PT  SHORT TERM GOAL #2  ? Title Full Rt knee extension and +/> Rt hip flexion to 90-100 degrees and extension to 0 to 5 deg   ? Baseline -   ? Period Weeks   ? Status New   ? Target Date 05/19/22   ?  ? PT SHORT TERM GOAL #3  ? Title Independent in transfers sit >/<supine   ? Baseline -   ? Time 4   ? Period Weeks   ? Status New   ? Target Date 05/19/22   ? ?  ?  ? ?  ? ? ? ? PT Long Term Goals - 04/21/22 1522   ? ?  ? PT LONG TERM GOAL #1  ? Title Pt will be independent with final HEP   ? Time 8   ? Period Weeks   ? Status New   ? Target Date 06/16/22   ?  ? PT LONG TERM GOAL #2  ? Title Pt will demo at least 4/5 bilat hip strength for improved single leg stability/gait/transfers   ? Time 8   ? Period Weeks   ? Status New   ? Target Date 06/16/22   ?  ? PT LONG TERM GOAL #3  ? Title Pt will be able to amb at least 400' with least restrictive assistive device with </=2/10 pain   ? Time 8   ? Period Weeks   ? Status New   ? Target Date 06/16/22   ?  ? PT LONG TERM GOAL #4  ? Title Independent in all transfers stand </> sit </> supine   ? Time 8   ? Period Weeks   ? Status New   ? Target Date 06/16/22   ?  ? PT LONG TERM GOAL #5  ? Title Improve functional limitation score to 44   ? Baseline -   ? Time 8   ? Period Weeks   ? Status New   ? Target Date 06/16/22   ? ?  ?  ? ?  ? ? ? ? ? ? ? ? ? Plan - 04/21/22 1135   ? ? Clinical Impression Statement Patient returns to PT s/p Rt THA anterior approach. He has antalgic gait with rolling walker; poor posture and alignment; limited Rt LE mobility/ROM; Rt LE weakness; dependent ADL's; pain and difficulty with transfers. Patient will benefit from PT to address problems identified.   ? Personal Factors and Comorbidities Age;Fitness;Time since onset of injury/illness/exacerbation;Comorbidity 1   ? Comorbidities OA bilat knee and R hip; previous L shoulder surgery   ? Examination-Activity Limitations Locomotion Level;Transfers;Stand;Squat;Lift   ? Examination-Participation  Restrictions Community Activity;Occupation;Yard Work;Shop   ? Stability/Clinical Decision Making Evolving/Moderate complexity   ? Clinical Decision Making Moderate   ? Rehab Potential Good   ? PT Frequency 2x /

## 2022-04-25 ENCOUNTER — Ambulatory Visit: Payer: Medicare Other | Admitting: Rehabilitative and Restorative Service Providers"

## 2022-04-25 ENCOUNTER — Encounter: Payer: Self-pay | Admitting: Rehabilitative and Restorative Service Providers"

## 2022-04-25 DIAGNOSIS — M25551 Pain in right hip: Secondary | ICD-10-CM

## 2022-04-25 DIAGNOSIS — R269 Unspecified abnormalities of gait and mobility: Secondary | ICD-10-CM | POA: Diagnosis not present

## 2022-04-25 DIAGNOSIS — R293 Abnormal posture: Secondary | ICD-10-CM | POA: Diagnosis not present

## 2022-04-25 DIAGNOSIS — R600 Localized edema: Secondary | ICD-10-CM | POA: Diagnosis not present

## 2022-04-25 DIAGNOSIS — M6281 Muscle weakness (generalized): Secondary | ICD-10-CM | POA: Diagnosis not present

## 2022-04-25 DIAGNOSIS — R29898 Other symptoms and signs involving the musculoskeletal system: Secondary | ICD-10-CM

## 2022-04-25 DIAGNOSIS — M25651 Stiffness of right hip, not elsewhere classified: Secondary | ICD-10-CM

## 2022-04-25 NOTE — Patient Instructions (Signed)
Access Code: [facility] ?URL: [email or web address] ?Date: 04/25/2022 ?Prepared by: Gillermo Murdoch ? ?Exercises ?- Supine Quad Set  - 2 x daily - 7 x weekly - 2 sets - 10 reps - 5 sec hold ?- Supine Heel Slide with Strap  - 2 x daily - 7 x weekly - 1 sets - 10 reps ?- Supine Gluteal Sets  - 2 x daily - 7 x weekly - 1 sets - 10 reps - 5 sec  hold ?- Supine Ankle Pumps  - 2 x daily - 7 x weekly - 1 sets - 3 reps - 30 sec  hold ?- Seated Ankle Circles  - 2 x daily - 7 x weekly - 1-2 sets - 10 reps - 2-3 sec  hold ?- Ankle Alphabet in Elevation  - 2 x daily - 7 x weekly - 1 sets - 3 reps ?- Standing Gluteal Sets  - 1 x daily - 7 x weekly - 1-2 sets - 10 reps - 5-10 sec  hold ?- Beginner Bridge  - 2 x daily - 7 x weekly - 1 sets - 10 reps - 3-5 sec  hold ?- Hooklying Isometric Clamshell  - 2 x daily - 7 x weekly - 1 sets - 10 reps - 3 sec  hold ?

## 2022-04-25 NOTE — Therapy (Signed)
[place] ?[facility] ?[address] ?[place], [place], [address] ?Phone: [phone]   Fax:  [phone] ? ?Physical Therapy Treatment ? ?Patient Details  ?Name: Erik Estrada ?MRN: [id number] ?Date of Birth: 05-14-1951 ?Referring Provider (PT): Dr Melrose Nakayama ? ? ?Encounter Date: 04/25/2022 ? ? PT End of Session - 04/25/22 1536   ? ? Visit Number 2   ? Number of Visits 16   ? Date for PT Re-Evaluation 06/16/22   ? Authorization Type Medicare   ? Authorization - Visit Number 2   ? Progress Note Due on Visit 10   ? PT Start Time 1530   ? PT Stop Time 1616   ? PT Time Calculation (min) 46 min   ? Activity Tolerance Patient tolerated treatment well;Patient limited by pain;No increased pain   ? ?  ?  ? ?  ? ? ?Past Medical History:  ?Diagnosis Date  ? Anxiety   ? Arthritis   ? Essential hypertension, benign 06/29/2011  ? Hemorrhoids 08/01/2018  ? History of kidney stones   ? Morbid obesity ([place]) 10/12/2018  ? 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 Pre-diabetes   ? Testosterone deficiency 10/12/2018  ? Tubular adenoma of colon 10/17/2018  ? Colonoscopy 2016 at [facility].  Plan for 5-year recheck.  ? Vitamin D deficiency 08/01/2017  ? ? ?Past Surgical History:  ?Procedure Laterality Date  ? KIDNEY STONE SURGERY    ? SHOULDER SURGERY  December 2010  ? Left  ? TOTAL HIP ARTHROPLASTY Right 04/19/2022  ? Procedure: RIGHT TOTAL HIP ARTHROPLASTY ANTERIOR APPROACH;  Surgeon: Melrose Nakayama, MD;  Location: [facility];  Service: Orthopedics;  Laterality: Right;  ? UMBILICAL HERNIA REPAIR    ? ? ?There were no vitals filed for this visit. ? ? Subjective Assessment - 04/25/22 1537   ? ? Subjective Patient and his wifr, Jenny Reichmann, reports that he is doing some walking at home and trying to get into a routine for exercises.   ? Currently in Pain? No/denies   ? Pain Score 0-No pain   with pain meds  ? Pain Location Hip   ? Pain Orientation Right   ? Multiple Pain Sites Yes   ?  Pain Score 3   ? Pain Location Knee   ? Pain Orientation Left   ? Pain Descriptors / Indicators Aching;Sharp   ? Pain Type Chronic pain   ? Pain Onset More than a month ago   ? Pain Frequency Constant   ? Aggravating Factors  prolonged standing; walking   ? Pain Relieving Factors sitting; resting   ? ?  ?  ? ?  ? ? ? ? ? [facility] PT Assessment - 04/25/22 0001   ? ?  ? Assessment  ? Medical Diagnosis Rt THA   ? Referring Provider (PT) Dr Melrose Nakayama   ? Onset Date/Surgical Date 04/19/22   pain and arthritis x ~ 2 years  ? Hand Dominance Right   ? Next MD Visit 04/29/22   ? Prior Therapy L shoulder; Lt knee and Rt hip   ?  ? Ambulation/Gait  ? Ambulation/Gait Assistance 6: Modified independent (Device/Increase time);5: Supervision   ? Ambulation Distance (Feet) 50 Feet   x2  ? Assistive device Rolling walker   ? Gait Pattern Step-through pattern;Decreased stance time - right;Decreased stride length;Decreased weight shift to left;Right circumduction;Decreased trunk rotation;Trunk flexed   ? Ambulation Surface Level   ? ?  ?  ? ?  ? ? ? ? ? ? ? ? ? ? ? ? ? ? ? ?  [place] Adult PT Treatment/Exercise - 04/25/22 0001   ? ?  ? Self-Care  ? Posture worked on standing posture and alignment with trunk,hip and knee extension with rolling walker for UE support   ?  ? Therapeutic Activites   ? Other Therapeutic Activities worked on rolling supine to Lt side; coming to sit to Rt side supine to sit pt assisting with Rt LE with stretch out strap - both independent VC only   ?  ? Knee/Hip Exercises: Standing  ? Gait Training gait with rolling walker working on wt shift and gait 50 ft x 2 SBA   ? Other Standing Knee Exercises weight shift L<>R x10 with rolling walker   ? Other Standing Knee Exercises glut sets 10 sec x 10 resp working on standing posture chest up, hips forward   ?  ? Knee/Hip Exercises: Seated  ? Long Arc Quad AROM;Right;10 reps   ? Long [employer] Limitations encouraging knee ROM   ? Sit to Sand 5 reps;with UE support   ?  ?  Knee/Hip Exercises: Supine  ? Quad Sets Strengthening;Right;5 reps   5 sec hold  ? Quad Sets Limitations straightening Rt knee   ? Heel Slides AAROM;Right;10 reps   ? Heel Slides Limitations with strap to assist with heel slide/knee flexion   ? Bridges Strengthening;Both;10 reps   ? Bridges Limitations limited range small lift of hips from surface   ? ?  ?  ? ?  ? ? ? ? ? ? ? ? ? ? PT Education - 04/25/22 1609   ? ? Education Details HEP   ? Person(s) Educated Patient   ? Methods Explanation;Demonstration;Tactile cues;Verbal cues;Handout   ? Comprehension Verbalized understanding;Returned demonstration;Verbal cues required;Tactile cues required   ? ?  ?  ? ?  ? ? ? PT Short Term Goals - 04/21/22 1520   ? ?  ? PT SHORT TERM GOAL #1  ? Title Pt will be independent with initial HEP   ? Time 4   ? Period Weeks   ? Status New   ? Target Date 05/19/22   ?  ? PT SHORT TERM GOAL #2  ? Title Full Rt knee extension and +/> Rt hip flexion to 90-100 degrees and extension to 0 to 5 deg   ? Baseline -   ? Period Weeks   ? Status New   ? Target Date 05/19/22   ?  ? PT SHORT TERM GOAL #3  ? Title Independent in transfers sit >/<supine   ? Baseline -   ? Time 4   ? Period Weeks   ? Status New   ? Target Date 05/19/22   ? ?  ?  ? ?  ? ? ? ? PT Long Term Goals - 04/21/22 1522   ? ?  ? PT LONG TERM GOAL #1  ? Title Pt will be independent with final HEP   ? Time 8   ? Period Weeks   ? Status New   ? Target Date 06/16/22   ?  ? PT LONG TERM GOAL #2  ? Title Pt will demo at least 4/5 bilat hip strength for improved single leg stability/gait/transfers   ? Time 8   ? Period Weeks   ? Status New   ? Target Date 06/16/22   ?  ? PT LONG TERM GOAL #3  ? Title Pt will be able to amb at least 400' with least restrictive assistive device with </=2/10 pain   ?  Time 8   ? Period Weeks   ? Status New   ? Target Date 06/16/22   ?  ? PT LONG TERM GOAL #4  ? Title Independent in all transfers stand </> sit </> supine   ? Time 8   ? Period Weeks   ?  Status New   ? Target Date 06/16/22   ?  ? PT LONG TERM GOAL #5  ? Title Improve functional limitation score to 44   ? Baseline -   ? Time 8   ? Period Weeks   ? Status New   ? Target Date 06/16/22   ? ?  ?  ? ?  ? ? ? ? ? ? ? ? Plan - 04/25/22 1546   ? ? Clinical Impression Statement Improving mobilty with transfers and improving gait with rolling walker. Added exercises for ROM and strength Rt hip/LE's.   ? Rehab Potential Good   ? PT Frequency 2x / week   ? PT Duration 8 weeks   ? PT Treatment/Interventions ADLs/Self Care Home Management;Aquatic Therapy;Cryotherapy;Electrical Stimulation;Iontophoresis '4mg'$ /ml Dexamethasone;Moist Heat;DME Instruction;Gait training;Stair training;Functional mobility training;Therapeutic activities;Therapeutic exercise;Balance training;Neuromuscular re-education;Manual techniques;Patient/family education;Passive range of motion;Dry needling;Taping   ? PT Next Visit Plan Review and progress with Rt THA rehab including ROM, strengthening, transfers, gait. Progress HEP as indicated. Modalities as indicated   ? PT Home Exercise Plan [facility]   ? Consulted and Agree with Plan of Care Patient;Family member/caregiver   ? Family Member Consulted Jenny Reichmann - wife   ? ?  ?  ? ?  ? ? ?Patient will benefit from skilled therapeutic intervention in order to improve the following deficits and impairments:    ? ?Visit Diagnosis: ?Pain in right hip ? ?Abnormal gait ? ?Stiffness of hip joint, right ? ?Other symptoms and signs involving the musculoskeletal system ? ?Muscle weakness (generalized) ? ?Abnormal posture ? ? ? ? ?Problem List ?Patient Active Problem List  ? Diagnosis Date Noted  ? Allergic sinusitis 04/12/2022  ? Rotator cuff tendinitis, right 02/03/2022  ? Primary osteoarthritis of right hip 06/24/2021  ? Primary osteoarthritis of left knee 03/29/2021  ? Hamstring strain, left, initial encounter 02/26/2021  ? 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 OA  (osteoarthritis) of knee 08/16/2020  ? Neoplasm of uncertain behavior of skin 03/12/2020  ? Tubular adenoma of colon 10/17/2018  ? Hyperlipidemia 10/12/2018  ? Greater trochanteric bursitis of both hips 1

## 2022-04-27 ENCOUNTER — Ambulatory Visit: Payer: Medicare Other | Admitting: Physical Therapy

## 2022-04-27 DIAGNOSIS — M6281 Muscle weakness (generalized): Secondary | ICD-10-CM | POA: Diagnosis not present

## 2022-04-27 DIAGNOSIS — R293 Abnormal posture: Secondary | ICD-10-CM | POA: Diagnosis not present

## 2022-04-27 DIAGNOSIS — R269 Unspecified abnormalities of gait and mobility: Secondary | ICD-10-CM | POA: Diagnosis not present

## 2022-04-27 DIAGNOSIS — M25651 Stiffness of right hip, not elsewhere classified: Secondary | ICD-10-CM | POA: Diagnosis not present

## 2022-04-27 DIAGNOSIS — R29898 Other symptoms and signs involving the musculoskeletal system: Secondary | ICD-10-CM

## 2022-04-27 DIAGNOSIS — M25551 Pain in right hip: Secondary | ICD-10-CM | POA: Diagnosis not present

## 2022-04-27 DIAGNOSIS — R600 Localized edema: Secondary | ICD-10-CM | POA: Diagnosis not present

## 2022-04-27 NOTE — Therapy (Signed)
[place] [facility] ?[place] ?[place], [place], [address] ?Phone: [phone]   Fax:  [phone] ? ?Physical Therapy Treatment ? ?Patient Details  ?Name: Erik Estrada ?MRN: [id number] ?Date of Birth: 06-23-1951 ?Referring Provider (PT): Dr Melrose Nakayama ? ? ?Encounter Date: 04/27/2022 ? ? PT End of Session - 04/27/22 1139   ? ? Visit Number 3   ? Number of Visits 16   ? Date for PT Re-Evaluation 06/16/22   ? Authorization Type Medicare   ? Authorization - Visit Number 3   ? Progress Note Due on Visit 10   ? PT Start Time 1100   ? PT Stop Time 1145   ? PT Time Calculation (min) 45 min   ? Activity Tolerance Patient tolerated treatment well   ? Behavior During Therapy [facility] for tasks assessed/performed   ? ?  ?  ? ?  ? ? ?Past Medical History:  ?Diagnosis Date  ? Anxiety   ? Arthritis   ? Essential hypertension, benign 06/29/2011  ? Hemorrhoids 08/01/2018  ? History of kidney stones   ? Morbid obesity ([place]) 10/12/2018  ? 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 Pre-diabetes   ? Testosterone deficiency 10/12/2018  ? Tubular adenoma of colon 10/17/2018  ? Colonoscopy 2016 at [facility].  Plan for 5-year recheck.  ? Vitamin D deficiency 08/01/2017  ? ? ?Past Surgical History:  ?Procedure Laterality Date  ? KIDNEY STONE SURGERY    ? SHOULDER SURGERY  December 2010  ? Left  ? TOTAL HIP ARTHROPLASTY Right 04/19/2022  ? Procedure: RIGHT TOTAL HIP ARTHROPLASTY ANTERIOR APPROACH;  Surgeon: Melrose Nakayama, MD;  Location: [facility];  Service: Orthopedics;  Laterality: Right;  ? UMBILICAL HERNIA REPAIR    ? ? ?There were no vitals filed for this visit. ? ? Subjective Assessment - 04/27/22 1104   ? ? Subjective Pt states he is still "black and blue" from the surgery. He has soreness but has been trying to exercise. Pt is wearing Lt knee brace to see if that helps with tolerating standing exercises   ? Patient Stated Goals use Rt hip and leg normally without pain   ?  Currently in Pain? Yes   ? Pain Score 4    ? Pain Location Hip   ? Pain Orientation Right   ? Pain Descriptors / Indicators Sore   ? ?  ?  ? ?  ? ? ? ? ? [facility] PT Assessment - 04/27/22 0001   ? ?  ? Assessment  ? Medical Diagnosis Rt THA   ? Referring Provider (PT) Dr Melrose Nakayama   ? Onset Date/Surgical Date 04/19/22   pain and arthritis x ~ 2 years  ? Hand Dominance Right   ? Next MD Visit 04/29/22   ? ?  ?  ? ?  ? ? ? ? ? ? ? ? ? ? ? ? ? ? ? ? [place] Adult PT Treatment/Exercise - 04/27/22 0001   ? ?  ? Ambulation/Gait  ? Ambulation/Gait Assistance 6: Modified independent (Device/Increase time)   ? Ambulation Distance (Feet) 100 Feet   ? Assistive device Rolling walker   ? Gait Pattern Step-through pattern;Decreased stance time - right;Decreased stride length;Decreased weight shift to left;Right circumduction;Decreased trunk rotation;Trunk flexed   ?  ? Knee/Hip Exercises: Aerobic  ? Nustep L5 x 4 min for warm up and AAROM   ?  ? Knee/Hip Exercises: Standing  ? Heel Raises 20 reps   ? Hip  Abduction Right;Left;2 sets;10 reps   ? Abduction Limitations heavy reliance on UEs   ? Hip Extension Right;Left;2 sets;10 reps   ? Extension Limitations heavy reliance on UEs   ?  ? Knee/Hip Exercises: Seated  ? Long [employer] 2 sets;10 reps   ? Long [employer] Limitations 3 sec hold   ?  ? Knee/Hip Exercises: Supine  ? Quad Sets Strengthening;Right;2 sets;10 reps   ? Quad Sets Limitations 3 sec hold   ? Heel Slides AROM;Right;15 reps   ? Bridges 2 sets;10 reps   ? Bridges with Clamshell --   clam only alt LE red TB 2 x 10  ? Other Supine Knee/Hip Exercises AAROM hip abduction x 10   ? ?  ?  ? ?  ? ? ? ? ? ? ? ? ? ? ? ? PT Short Term Goals - 04/21/22 1520   ? ?  ? PT SHORT TERM GOAL #1  ? Title Pt will be independent with initial HEP   ? Time 4   ? Period Weeks   ? Status New   ? Target Date 05/19/22   ?  ? PT SHORT TERM GOAL #2  ? Title Full Rt knee extension and +/> Rt hip flexion to 90-100 degrees and extension to 0 to 5 deg   ?  Baseline -   ? Period Weeks   ? Status New   ? Target Date 05/19/22   ?  ? PT SHORT TERM GOAL #3  ? Title Independent in transfers sit >/<supine   ? Baseline -   ? Time 4   ? Period Weeks   ? Status New   ? Target Date 05/19/22   ? ?  ?  ? ?  ? ? ? ? PT Long Term Goals - 04/21/22 1522   ? ?  ? PT LONG TERM GOAL #1  ? Title Pt will be independent with final HEP   ? Time 8   ? Period Weeks   ? Status New   ? Target Date 06/16/22   ?  ? PT LONG TERM GOAL #2  ? Title Pt will demo at least 4/5 bilat hip strength for improved single leg stability/gait/transfers   ? Time 8   ? Period Weeks   ? Status New   ? Target Date 06/16/22   ?  ? PT LONG TERM GOAL #3  ? Title Pt will be able to amb at least 400' with least restrictive assistive device with </=2/10 pain   ? Time 8   ? Period Weeks   ? Status New   ? Target Date 06/16/22   ?  ? PT LONG TERM GOAL #4  ? Title Independent in all transfers stand </> sit </> supine   ? Time 8   ? Period Weeks   ? Status New   ? Target Date 06/16/22   ?  ? PT LONG TERM GOAL #5  ? Title Improve functional limitation score to 44   ? Baseline -   ? Time 8   ? Period Weeks   ? Status New   ? Target Date 06/16/22   ? ?  ?  ? ?  ? ? ? ? ? ? ? ? Plan - 04/27/22 1139   ? ? Clinical Impression Statement Pt continues to require min A with supine <> sit. Able to better tolerate standing exercises with use of Lt knee brace today. Improved performance with bridges and quad  sets today. still unable to perform SLR   ? PT Next Visit Plan progress HEP as toelrated. continue standing therex, gait training   ? PT Home Exercise Plan [facility]   ? Consulted and Agree with Plan of Care Patient   ? ?  ?  ? ?  ? ? ?Patient will benefit from skilled therapeutic intervention in order to improve the following deficits and impairments:    ? ?Visit Diagnosis: ?Pain in right hip ? ?Abnormal gait ? ?Stiffness of hip joint, right ? ?Other symptoms and signs involving the musculoskeletal system ? ?Muscle weakness  (generalized) ? ? ? ? ?Problem List ?Patient Active Problem List  ? Diagnosis Date Noted  ? Allergic sinusitis 04/12/2022  ? Rotator cuff tendinitis, right 02/03/2022  ? Primary osteoarthritis of right hip 06/24/2021  ? Primary osteoarthritis of left knee 03/29/2021  ? Hamstring strain, left, initial encounter 02/26/2021  ? 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 OA (osteoarthritis) of knee 08/16/2020  ? Neoplasm of uncertain behavior of skin 03/12/2020  ? Tubular adenoma of colon 10/17/2018  ? Hyperlipidemia 10/12/2018  ? Greater trochanteric bursitis of both hips 10/12/2018  ? Morbid obesity ([place]) 10/12/2018  ? Vitamin D deficiency 08/01/2017  ? 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 Family history of colon cancer requiring screening colonoscopy 05/16/2012  ? Prediabetes 05/16/2012  ? Tobacco abuse 07/20/2011  ? Hypogonadism male 07/20/2011  ? Essential hypertension, benign 06/29/2011  ? ? ?Anarely Nicholls, PT ?04/27/2022, 11:41 AM ? ?[place] [facility] ?[place] ?[place], [place], [address] ?Phone: [phone]   Fax:  [phone] ? ?Name: Erik Estrada ?MRN: [id number] ?Date of Birth: 04/08/1951 ? ? ? ?

## 2022-04-29 DIAGNOSIS — Z471 Aftercare following joint replacement surgery: Secondary | ICD-10-CM | POA: Diagnosis not present

## 2022-04-29 DIAGNOSIS — Z96641 Presence of right artificial hip joint: Secondary | ICD-10-CM | POA: Diagnosis not present

## 2022-05-04 ENCOUNTER — Ambulatory Visit: Payer: Medicare Other | Admitting: Physical Therapy

## 2022-05-04 DIAGNOSIS — M25551 Pain in right hip: Secondary | ICD-10-CM

## 2022-05-04 DIAGNOSIS — R293 Abnormal posture: Secondary | ICD-10-CM | POA: Diagnosis not present

## 2022-05-04 DIAGNOSIS — M6281 Muscle weakness (generalized): Secondary | ICD-10-CM | POA: Diagnosis not present

## 2022-05-04 DIAGNOSIS — R269 Unspecified abnormalities of gait and mobility: Secondary | ICD-10-CM

## 2022-05-04 DIAGNOSIS — M25651 Stiffness of right hip, not elsewhere classified: Secondary | ICD-10-CM | POA: Diagnosis not present

## 2022-05-04 DIAGNOSIS — R29898 Other symptoms and signs involving the musculoskeletal system: Secondary | ICD-10-CM

## 2022-05-04 DIAGNOSIS — R600 Localized edema: Secondary | ICD-10-CM | POA: Diagnosis not present

## 2022-05-04 NOTE — Therapy (Signed)
[place], [place], [address] Phone: [phone]   Fax:  [phone]  Physical Therapy Treatment  Patient Details  Name: Erik Estrada MRN: [id number] Date of Birth: Aug 18, 1951 Referring Provider (PT): Dr Melrose Nakayama   Encounter Date: 05/04/2022   PT End of Session - 05/04/22 1517     Visit Number 4    Number of Visits 16    Date for PT Re-Evaluation 06/16/22    Authorization Type Medicare    Authorization - Visit Number 4    Progress Note Due on Visit 10    PT Start Time 1430    PT Stop Time 6962    PT Time Calculation (min) 47 min    Activity Tolerance Patient tolerated treatment well    Behavior During Therapy [facility] for tasks assessed/performed             Past Medical History:  Diagnosis Date   Anxiety    Arthritis    Essential hypertension, benign 06/29/2011   Hemorrhoids 08/01/2018   History of kidney stones    Morbid obesity ([place]) 10/12/2018   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  Plan for 5-year recheck.   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   Subjective Assessment - 05/04/22 1432     Subjective Pt states his hip is feeling pretty good. His knee is bothering him the most    Patient Stated Goals use Rt hip and leg normally without pain    Currently in Pain? Yes    Pain Score 2     Pain Orientation Right    Pain Descriptors / Indicators Sore                 [facility] PT Assessment - 05/04/22 0001       Assessment   Medical Diagnosis Rt THA    Referring Provider (PT) Dr Melrose Nakayama    Onset Date/Surgical Date 04/19/22   pain and arthritis x ~ 2 years   Hand Dominance Right    Next MD Visit 05/20/22      Bed Mobility   Bed Mobility Sit to Supine    Sit to Supine Minimal Assistance - Patient > 75%                           [facility] Adult PT Treatment/Exercise - 05/04/22 0001       Ambulation/Gait   Ambulation/Gait Assistance 6: Modified independent (Device/Increase time)    Ambulation Distance (Feet) 100 Feet    Assistive device Rolling walker    Gait Pattern Step-through pattern;Decreased stance time - right;Decreased stride length;Decreased weight shift to left;Right circumduction;Decreased trunk rotation;Trunk flexed    Stairs Yes    Stairs Assistance 5: Supervision    Stair Management Technique One rail Right;Step to pattern;Forwards;With cane    Number of Stairs 6    Height of Stairs --  4'', 6''   Gait Comments gait training with SPC with min A HHA      Knee/Hip Exercises: Aerobic   Nustep L5 x 4 min for warm up and AAROM      Knee/Hip Exercises: Standing   Heel Raises 20 reps    Hip Abduction Right;Left;2 sets;10 reps    Abduction Limitations heavy reliance on UEs    Hip Extension Right;Left;15 reps    Extension Limitations heavy reliance on UEs. Increased Rt LE pain with wt bearing today      Knee/Hip Exercises: Seated   Long Arc Quad 2 sets;10 reps    Long Arc Quad Limitations 3 sec hold      Knee/Hip Exercises: Supine   Quad Sets Strengthening;Right;2 sets;10 reps    Quad Sets Limitations 3 sec hold    Heel Slides AROM;Right;15 reps    Bridges 2 sets;10 reps    Bridges Limitations able to achieve full range today    Bridges with Clamshell --   clam only alt LE red TB 2 x 10                      PT Short Term Goals - 04/21/22 1520       PT SHORT TERM GOAL #1    Title Pt will be independent with initial HEP    Time 4    Period Weeks    Status New    Target Date 05/19/22      PT SHORT TERM GOAL #2   Title Full Rt knee extension and +/> Rt hip flexion to 90-100 degrees and extension to 0 to 5 deg    Baseline -    Period Weeks    Status New    Target Date 05/19/22      PT SHORT TERM GOAL #3   Title Independent in transfers sit >/<supine    Baseline -    Time 4    Period Weeks    Status New    Target Date 05/19/22               PT Long Term Goals - 04/21/22 1522       PT LONG TERM GOAL #1   Title Pt will be independent with final HEP    Time 8    Period Weeks    Status New    Target Date 06/16/22      PT LONG TERM GOAL #2   Title Pt will demo at least 4/5 bilat hip strength for improved single leg stability/gait/transfers    Time 8    Period Weeks    Status New    Target Date 06/16/22      PT LONG TERM GOAL #3   Title Pt will be able to amb at least 400' with least restrictive assistive device with </=2/10 pain    Time 8    Period Weeks    Status New    Target Date 06/16/22      PT LONG TERM GOAL #4   Title Independent in all transfers stand </> sit </> supine    Time 8    Period Weeks    Status New    Target Date 06/16/22      PT LONG TERM GOAL #5   Title Improve functional limitation score to 44    Baseline -    Time 8    Period Weeks    Status New    Target Date 06/16/22  Plan - 05/04/22 1518     Clinical Impression Statement Attempted gait with SPC and stair negotiation today. Pt continues to require min A/HHA for gait with SPC. Good tolerance to stair negotiation with use of cane and 1 railing. Pt states he has a 3 level home with 6 stairs to each level. Pt will continue to benefit from gait/stair training and hip strength and endurance    PT Next Visit Plan progress HEP as toelrated. continue standing therex, gait training    PT Home Exercise Plan [id number]    [id number] and  Agree with Plan of Care Patient             Patient will benefit from skilled therapeutic intervention in order to improve the following deficits and impairments:     Visit Diagnosis: Pain in right hip  Abnormal gait  Stiffness of hip joint, right  Other symptoms and signs involving the musculoskeletal system  Muscle weakness (generalized)     Problem List Patient Active Problem List   Diagnosis Date Noted   Allergic sinusitis 04/12/2022   Rotator cuff tendinitis, right 02/03/2022   Primary osteoarthritis of right hip 06/24/2021   Primary osteoarthritis of left knee 03/29/2021   Hamstring strain, left, initial encounter 02/26/2021   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 01/29/2021   OA (osteoarthritis) of knee 08/16/2020   Neoplasm of uncertain behavior of skin 03/12/2020   Tubular adenoma of colon 10/17/2018   Hyperlipidemia 10/12/2018   Greater trochanteric bursitis of both hips 10/12/2018   Morbid obesity ([place]) 10/12/2018   Vitamin D deficiency 08/01/2017   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

## 2022-05-10 ENCOUNTER — Ambulatory Visit: Payer: Medicare Other | Admitting: Rehabilitative and Restorative Service Providers"

## 2022-05-10 ENCOUNTER — Encounter: Payer: Self-pay | Admitting: Rehabilitative and Restorative Service Providers"

## 2022-05-10 DIAGNOSIS — R29898 Other symptoms and signs involving the musculoskeletal system: Secondary | ICD-10-CM

## 2022-05-10 DIAGNOSIS — M6281 Muscle weakness (generalized): Secondary | ICD-10-CM

## 2022-05-10 DIAGNOSIS — R600 Localized edema: Secondary | ICD-10-CM | POA: Diagnosis not present

## 2022-05-10 DIAGNOSIS — M25551 Pain in right hip: Secondary | ICD-10-CM

## 2022-05-10 DIAGNOSIS — R293 Abnormal posture: Secondary | ICD-10-CM

## 2022-05-10 DIAGNOSIS — M25651 Stiffness of right hip, not elsewhere classified: Secondary | ICD-10-CM

## 2022-05-10 DIAGNOSIS — R269 Unspecified abnormalities of gait and mobility: Secondary | ICD-10-CM

## 2022-05-10 NOTE — Therapy (Signed)
[place], [place], [address] Phone: [phone]   Fax:  [phone]  Physical Therapy Treatment Rationale for Evaluation and Treatment Rehabilitation  Patient Details  Name: Erik Estrada MRN: [id number] Date of Birth: [birth year] Referring Provider (PT): Dr Melrose Nakayama   Encounter Date: 05/10/2022   PT End of Session - 05/10/22 1415     Visit Number 5    Number of Visits 16    Date for PT Re-Evaluation 06/16/22    Authorization Type Medicare    Authorization - Visit Number 5    Progress Note Due on Visit 10    PT Start Time 0350    PT Stop Time 1450    PT Time Calculation (min) 48 min    Activity Tolerance Patient tolerated treatment well             Past Medical History:  Diagnosis Date   Anxiety    Arthritis    Essential hypertension, benign 06/29/2011   Hemorrhoids 08/01/2018   History of kidney stones    Morbid obesity ([place]) 10/12/2018   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  Plan for 5-year recheck.   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   Subjective Assessment - 05/10/22 1416     Subjective Patient reports that he has some itching in the area of the incision. He is not supposed to put any lotion on the incision. Suggested he contact MD office to ask if he can put some lotion on the incision area now that he is > 3 weeks post surgery. Patient reports  greatest problem with the Lt knee instead of the Rt hip. He is now going up and sdown stairs at home. Practiced walking with SP cane in clinic.    Currently in Pain? Yes    Pain Score 2     Pain Location Hip    Pain Orientation Right    Pain Descriptors / Indicators Sore    Pain Type Surgical pain                               [facility] Adult PT Treatment/Exercise - 05/10/22 0001       Bed Mobility   Bed Mobility Sit to Supine;Supine to Sit;Sit to Sidelying Left    Sit to Supine Independent      Ambulation/Gait   Ambulation/Gait Assistance 6: Modified independent (Device/Increase time)    Ambulation Distance (Feet) 140 Feet    Assistive device Straight cane    Gait Pattern Step-through pattern;Decreased stance time - right;Decreased stride length;Decreased weight shift to left;Right circumduction;Decreased trunk rotation;Trunk flexed      Knee/Hip Exercises: Aerobic   Nustep L5 x 6 min for warm up and AAROM      Knee/Hip Exercises: Standing   Heel Raises 20 reps    Hip Abduction AROM;Stengthening;Right;10 reps;Knee straight   WB  Rt moving Lt   Abduction Limitations heavy reliance on UEs    Hip Extension AROM;Stengthening;Right;10 reps;Knee straight   WB Rt, moving Lt   Extension Limitations heavy reliance on UEs      Knee/Hip Exercises: Supine   Quad Sets Strengthening;Right;2 sets;10 reps    Quad Sets Limitations 3 sec hold    Heel Slides AROM;Right;15 reps    Bridges 2 sets;10 reps    Bridges Limitations bridge with green TB distal thigh x 20 reps    Bridges with [employer] --   clam only alt LE green TB 2 x 10   Straight Leg Raises AAROM;Right;5 reps    Straight Leg Raises Limitations assisted eccentric lowering with PT assist    Other Supine Knee/Hip Exercises AAROM hip abduction x 10      Knee/Hip Exercises: Sidelying   Clams 5 reps x 2 sets minimal range                       PT Short Term Goals - 04/21/22 1520       PT SHORT TERM  GOAL #1   Title Pt will be independent with initial HEP    Time 4    Period Weeks    Status New    Target Date 05/19/22      PT SHORT TERM GOAL #2   Title Full Rt knee extension and +/> Rt hip flexion to 90-100 degrees and extension to 0 to 5 deg    Baseline -    Period Weeks    Status New    Target Date 05/19/22      PT SHORT TERM GOAL #3   Title Independent in transfers sit >/<supine    Baseline -    Time 4    Period Weeks    Status New    Target Date 05/19/22               PT Long Term Goals - 04/21/22 1522       PT LONG TERM GOAL #1   Title Pt will be independent with final HEP    Time 8    Period Weeks    Status New    Target Date 06/16/22      PT LONG TERM GOAL #2   Title Pt will demo at least 4/5 bilat hip strength for improved single leg stability/gait/transfers    Time 8    Period Weeks    Status New    Target Date 06/16/22      PT LONG TERM GOAL #3   Title Pt will be able to amb at least 400' with least restrictive assistive device with </=2/10 pain    Time 8    Period Weeks    Status New    Target Date 06/16/22      PT LONG TERM GOAL #4   Title Independent in all transfers stand </> sit </> supine    Time 8    Period Weeks    Status New    Target Date 06/16/22      PT LONG TERM GOAL #5   Title Improve functional limitation score to 44    Baseline -    Time 8    Period Weeks    Status New    Target Date 06/16/22                   Plan - 05/10/22 1430     Clinical Impression  Statement Progressing well with gait with SP cane with cane in the Rt UE. Continues to ascend and descend stairs with SPC and one rail. Continued with strengthening. Lt knee pain is more problematic than Rt hip. Progressing well.    Rehab Potential Good    PT Frequency 2x / week    PT Duration 8 weeks    PT Treatment/Interventions ADLs/Self Care Home Management;Aquatic Therapy;Cryotherapy;Electrical Stimulation;Iontophoresis '4mg'$ /ml Dexamethasone;Moist  Heat;DME Instruction;Gait training;Stair training;Functional mobility training;Therapeutic activities;Therapeutic exercise;Balance training;Neuromuscular re-education;Manual techniques;Patient/family education;Passive range of motion;Dry needling;Taping    PT Next Visit Plan progress HEP as toelrated. continue standing therex, gait training    PT Home Exercise Plan [facility]    Consulted and Agree with Plan of Care Patient    Family Member Consulted Jenny Reichmann - wife             Patient will benefit from skilled therapeutic intervention in order to improve the following deficits and impairments:     Visit Diagnosis: Pain in right hip  Abnormal gait  Stiffness of hip joint, right  Other symptoms and signs involving the musculoskeletal system  Muscle weakness (generalized)  Abnormal posture     Problem List Patient Active Problem List   Diagnosis Date Noted   Allergic sinusitis 04/12/2022   Rotator cuff tendinitis, right 02/03/2022   Primary osteoarthritis of right hip 06/24/2021   Primary osteoarthritis of left knee 03/29/2021   Hamstring strain, left, initial encounter 02/26/2021   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 01/29/2021   OA (osteoarthritis) of knee 08/16/2020   Neoplasm of uncertain behavior of skin 03/12/2020   Tubular adenoma of colon 10/17/2018   Hyperlipidemia 10/12/2018   Greater trochanteric bursitis of both hips 10/12/2018   Morbid obesity ([place]) 10/12/2018   Vitamin D deficiency 08/01/2017   Neurotic excoriations 07/27/2016   Family history of colon cancer requiring screening colonoscopy 05/16/2012   Prediabetes 05/16/2012   Tobacco abuse 07/20/2011   Hypogonadism male 07/20/2011   Essential hypertension, benign 06/29/2011    Taylia Berber Nilda Simmer, PT, MPH  05/10/2022, 4:32 PM  [facility] [place], [place], [address] Phone: [phone]   Fax:   [phone]  Name: Erik Estrada MRN: [id number] Date of Birth: Apr 29, 1951

## 2022-05-12 ENCOUNTER — Ambulatory Visit: Payer: Medicare Other | Attending: Family Medicine | Admitting: Rehabilitative and Restorative Service Providers"

## 2022-05-12 ENCOUNTER — Encounter: Payer: Self-pay | Admitting: Rehabilitative and Restorative Service Providers"

## 2022-05-12 DIAGNOSIS — M25662 Stiffness of left knee, not elsewhere classified: Secondary | ICD-10-CM | POA: Diagnosis not present

## 2022-05-12 DIAGNOSIS — R600 Localized edema: Secondary | ICD-10-CM | POA: Insufficient documentation

## 2022-05-12 DIAGNOSIS — R29898 Other symptoms and signs involving the musculoskeletal system: Secondary | ICD-10-CM | POA: Diagnosis not present

## 2022-05-12 DIAGNOSIS — M6281 Muscle weakness (generalized): Secondary | ICD-10-CM | POA: Insufficient documentation

## 2022-05-12 DIAGNOSIS — R269 Unspecified abnormalities of gait and mobility: Secondary | ICD-10-CM | POA: Diagnosis not present

## 2022-05-12 DIAGNOSIS — G8929 Other chronic pain: Secondary | ICD-10-CM | POA: Insufficient documentation

## 2022-05-12 DIAGNOSIS — M25551 Pain in right hip: Secondary | ICD-10-CM | POA: Insufficient documentation

## 2022-05-12 DIAGNOSIS — M25651 Stiffness of right hip, not elsewhere classified: Secondary | ICD-10-CM | POA: Insufficient documentation

## 2022-05-12 DIAGNOSIS — M25562 Pain in left knee: Secondary | ICD-10-CM | POA: Insufficient documentation

## 2022-05-12 DIAGNOSIS — R293 Abnormal posture: Secondary | ICD-10-CM | POA: Diagnosis not present

## 2022-05-12 NOTE — Therapy (Signed)
[place] [address] [place], [place], [address] Phone: [phone]   Fax:  [phone]  Physical Therapy Treatment Rationale for Evaluation and Treatment Rehabilitation  Patient Details  Name: Erik Estrada MRN: [id number] Date of Birth: 1950-12-25 Referring Provider (PT): Dr Melrose Nakayama   Encounter Date: 05/12/2022   PT End of Session - 05/12/22 1114     Visit Number 6    Number of Visits 16    Date for PT Re-Evaluation 06/16/22    Authorization Type Medicare    Authorization - Visit Number 6    Progress Note Due on Visit 10    PT Start Time 1100    PT Stop Time 3474    PT Time Calculation (min) 48 min    Activity Tolerance Patient tolerated treatment well             Past Medical History:  Diagnosis Date   Anxiety    Arthritis    Essential hypertension, benign 06/29/2011   Hemorrhoids 08/01/2018   History of kidney stones    Morbid obesity ([place]) 10/12/2018   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  Plan for 5-year recheck.   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   Subjective Assessment - 05/12/22 1114     Subjective [name] reports that his Lt knee is more painful than the Rt hip. He is going up and down stairs at home with [facility] and rail.    Currently in Pain? Yes    Pain Score 2     Pain Location Hip    Pain Orientation Right    Pain Type Surgical pain                                [facility] Adult PT Treatment/Exercise - 05/12/22 0001       Ambulation/Gait   Ambulation/Gait Assistance 6: Modified independent (Device/Increase time)    Ambulation Distance (Feet) 160 Feet    Assistive device Straight cane    Gait Pattern Step-through pattern;Decreased stance time - right;Decreased stride length;Decreased weight shift to left;Right circumduction;Decreased trunk rotation;Trunk flexed    Gait Comments gait training with SPC with SBA      Knee/Hip Exercises: Aerobic   Nustep L5 x 6 min for warm up and AAROM      Knee/Hip Exercises: Standing   Heel Raises 20 reps    Heel Raises Limitations at walker - minimal use of UE's    Knee Flexion AROM;Strengthening;Right;Left;2 sets;10 reps    Knee Flexion Limitations marching UE support on walker    Hip Abduction AROM;Stengthening;Right;10 reps;Knee straight   WB Rt moving Lt   Abduction Limitations heavy reliance on UEs    Hip Extension AROM;Stengthening;Right;10 reps;Knee straight   WB Rt, moving Lt   Extension Limitations heavy reliance on UEs    Forward Step Up Right;10 reps;Hand Hold: 2;Step Height: 4"  Functional Squat 2 sets;10 reps    Functional Squat Limitations standing in walker minimal use of UE more Lt to support Lt knee    Other Standing Knee Exercises glut sets 10 reps 5 sec hold    Other Standing Knee Exercises standing without support holding 5# KB push out pull in chest level and push overhead and back to chest x 20 reps working on standing balance; shoulder horizontal abduction UE's supinated/then pronated green TB x 10 each      Knee/Hip Exercises: Seated   Other Seated Knee/Hip Exercises shoulder horizontal abduction green UE supinated x 10; pronated x 10    Sit to Sand 15 reps;without UE support;10 reps   from [employer]                    PT Education - 05/12/22 1147     Education Details HEP    Person(s) Educated Patient    Methods  Explanation;Demonstration;Tactile cues;Verbal cues;Handout    Comprehension Verbalized understanding;Returned demonstration;Verbal cues required;Tactile cues required              PT Short Term Goals - 04/21/22 1520       PT SHORT TERM GOAL #1   Title Pt will be independent with initial HEP    Time 4    Period Weeks    Status New    Target Date 05/19/22      PT SHORT TERM GOAL #2   Title Full Rt knee extension and +/> Rt hip flexion to 90-100 degrees and extension to 0 to 5 deg    Baseline -    Period Weeks    Status New    Target Date 05/19/22      PT SHORT TERM GOAL #3   Title Independent in transfers sit >/<supine    Baseline -    Time 4    Period Weeks    Status New    Target Date 05/19/22               PT Long Term Goals - 04/21/22 1522       PT LONG TERM GOAL #1   Title Pt will be independent with final HEP    Time 8    Period Weeks    Status New    Target Date 06/16/22      PT LONG TERM GOAL #2   Title Pt will demo at least 4/5 bilat hip strength for improved single leg stability/gait/transfers    Time 8    Period Weeks    Status New    Target Date 06/16/22      PT LONG TERM GOAL #3   Title Pt will be able to amb at least 400' with least restrictive assistive device with </=2/10 pain    Time 8    Period Weeks    Status New    Target Date 06/16/22      PT LONG TERM GOAL #4   Title Independent in all transfers stand </> sit </> supine    Time 8    Period Weeks    Status New    Target Date 06/16/22      PT LONG TERM GOAL #5   Title Improve functional limitation score to 44    Baseline -    Time 8    Period Weeks    Status New    Target Date 06/16/22  Plan - 05/12/22 1116     Clinical Impression Statement Continued gradual porgress with Rt THA rehab. Patient is gaining mobility and strength for functional activities and ADL's. He has continued pain in the Lt knee limiting rehab exercises. Added UE  exercises in standing to work on standing endurance and balance.    Rehab Potential Good    PT Frequency 2x / week    PT Duration 8 weeks    PT Treatment/Interventions ADLs/Self Care Home Management;Aquatic Therapy;Cryotherapy;Electrical Stimulation;Iontophoresis '4mg'$ /ml Dexamethasone;Moist Heat;DME Instruction;Gait training;Stair training;Functional mobility training;Therapeutic activities;Therapeutic exercise;Balance training;Neuromuscular re-education;Manual techniques;Patient/family education;Passive range of motion;Dry needling;Taping    PT Next Visit Plan progress HEP as tolerated. continue standing ther ex, gait training    PT Home Exercise Plan [email or web address]    [facility] and Agree with Plan of Care Patient             Patient will benefit from skilled therapeutic intervention in order to improve the following deficits and impairments:     Visit Diagnosis: Pain in right hip  Abnormal gait  Stiffness of hip joint, right  Other symptoms and signs involving the musculoskeletal system  Muscle weakness (generalized)  Abnormal posture  Localized edema     Problem List Patient Active Problem List   Diagnosis Date Noted   Allergic sinusitis 04/12/2022   Rotator cuff tendinitis, right 02/03/2022   Primary osteoarthritis of right hip 06/24/2021   Primary osteoarthritis of left knee 03/29/2021   Hamstring strain, left, initial encounter 02/26/2021   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 01/29/2021   OA (osteoarthritis) of knee 08/16/2020   Neoplasm of uncertain behavior of skin 03/12/2020   Tubular adenoma of colon 10/17/2018   Hyperlipidemia 10/12/2018   Greater trochanteric bursitis of both hips 10/12/2018   Morbid obesity ([place]) 10/12/2018   Vitamin D deficiency 08/01/2017   Neurotic excoriations 07/27/2016   Family history of colon cancer requiring screening colonoscopy 05/16/2012   Prediabetes 05/16/2012   Tobacco abuse 07/20/2011    Hypogonadism male 07/20/2011   Essential hypertension, benign 06/29/2011    Micole Delehanty Nilda Simmer, PT, MPH  05/12/2022, 11:48 AM  [facility] [place], [place], [address] Phone: [phone]   Fax:  [phone]  Name: Kwasi Joung MRN: [id number] Date of Birth: 01/10/1951

## 2022-05-12 NOTE — Patient Instructions (Signed)
Access Code: [email or web address] URL: [email or web address] Date: 05/12/2022 Prepared by: Gillermo Murdoch  Exercises - Supine Quad Set  - 2 x daily - 7 x weekly - 2 sets - 10 reps - 5 sec hold - Supine Heel Slide with Strap  - 2 x daily - 7 x weekly - 1 sets - 10 reps - Supine Gluteal Sets  - 2 x daily - 7 x weekly - 1 sets - 10 reps - 5 sec  hold - Supine Ankle Pumps  - 2 x daily - 7 x weekly - 1 sets - 3 reps - 30 sec  hold - Seated Ankle Circles  - 2 x daily - 7 x weekly - 1-2 sets - 10 reps - 2-3 sec  hold - Ankle Alphabet in Elevation  - 2 x daily - 7 x weekly - 1 sets - 3 reps - Standing Gluteal Sets  - 1 x daily - 7 x weekly - 1-2 sets - 10 reps - 5-10 sec  hold - Beginner Bridge  - 2 x daily - 7 x weekly - 1 sets - 10 reps - 3-5 sec  hold - Hooklying Isometric Clamshell  - 2 x daily - 7 x weekly - 1 sets - 10 reps - 3 sec  hold - Sit to Stand  - 2 x daily - 7 x weekly - 1 sets - 10 reps - 3-5 sec  hold - Standing Shoulder Horizontal Abduction with Resistance  - 2 x daily - 7 x weekly - 2-3 sets - 10 reps - 3 sec  hold

## 2022-05-18 ENCOUNTER — Other Ambulatory Visit: Payer: Self-pay | Admitting: Family Medicine

## 2022-05-18 ENCOUNTER — Ambulatory Visit: Payer: Medicare Other | Admitting: Physical Therapy

## 2022-05-18 DIAGNOSIS — M25651 Stiffness of right hip, not elsewhere classified: Secondary | ICD-10-CM | POA: Diagnosis not present

## 2022-05-18 DIAGNOSIS — R269 Unspecified abnormalities of gait and mobility: Secondary | ICD-10-CM | POA: Diagnosis not present

## 2022-05-18 DIAGNOSIS — R29898 Other symptoms and signs involving the musculoskeletal system: Secondary | ICD-10-CM | POA: Diagnosis not present

## 2022-05-18 DIAGNOSIS — M25551 Pain in right hip: Secondary | ICD-10-CM | POA: Diagnosis not present

## 2022-05-18 DIAGNOSIS — R293 Abnormal posture: Secondary | ICD-10-CM | POA: Diagnosis not present

## 2022-05-18 DIAGNOSIS — M6281 Muscle weakness (generalized): Secondary | ICD-10-CM

## 2022-05-18 NOTE — Therapy (Signed)
[place], [place], [address] Phone: [phone]   Fax:  [phone]  Physical Therapy Treatment  Patient Details  Name: Erik Estrada MRN: [id number] Date of Birth: 04-22-1951 Referring Provider (PT): Dr Melrose Nakayama   Encounter Date: 05/18/2022 Rationale for Evaluation and Treatment Rehabilitation   PT End of Session - 05/18/22 1400     Visit Number 7    Number of Visits 16    Authorization Type Medicare    Authorization - Visit Number 7    Progress Note Due on Visit 10    PT Start Time 8110    PT Stop Time 3159    PT Time Calculation (min) 43 min    Activity Tolerance Patient tolerated treatment well    Behavior During Therapy [facility] for tasks assessed/performed             Past Medical History:  Diagnosis Date   Anxiety    Arthritis    Essential hypertension, benign 06/29/2011   Hemorrhoids 08/01/2018   History of kidney stones    Morbid obesity ([place]) 10/12/2018   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  Plan for 5-year recheck.   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   Subjective Assessment - 05/18/22 1316     Subjective Pt states he saw MD this morning and "everything is going well". He says MD recommends Lt knee replacement. Pt has been using SPC for stair negotiation at home and for ambulation in the house. He still uses RW in community    Patient Stated  Goals use Rt hip and leg normally without pain    Currently in Pain? Yes    Pain Score 3     Pain Location Hip    Pain Orientation Right    Pain Descriptors / Indicators Sore    Pain Type Surgical pain                               [facility] Adult PT Treatment/Exercise - 05/18/22 0001       Ambulation/Gait   Ambulation/Gait Assistance 6: Modified independent (Device/Increase time)    Ambulation Distance (Feet) 180 Feet    Assistive device Straight cane    Gait Pattern Step-through pattern      Knee/Hip Exercises: Aerobic   Nustep L5 x 6 min for warm up and AAROM      Knee/Hip Exercises: Standing   Heel Raises 20 reps    Heel Raises Limitations at walker    Knee Flexion 20 reps    Knee Flexion Limitations UE support on walker    Hip Abduction 20 reps    Hip Extension 20 reps    Functional Squat 20 reps    Functional Squat Limitations mini squat in walker with minimal use of UEs    Other Standing Knee Exercises standing on foam: narrow BOS x  30 sec, semitandem stance x 30 bilat, narrow BOS with head turns horizontal, vertical 30 sec each    Other Standing Knee Exercises standing balance without UE support med ball chest press and overhead lift 2kg med ball x 20 each      Knee/Hip Exercises: Seated   Sit to Sand 10 reps;without UE support      Knee/Hip Exercises: Supine   Bridges 20 reps    Bridges Limitations green TB    Bridges with Clamshell --   clam only alt LE x 20 green TB                      PT Short Term Goals - 04/21/22 1520       PT SHORT TERM GOAL #1   Title Pt will be independent with initial HEP    Time 4    Period Weeks    Status New    Target Date 05/19/22      PT SHORT TERM GOAL #2   Title Full Rt knee extension and +/> Rt hip flexion to 90-100 degrees and extension to 0 to 5 deg    Baseline -    Period Weeks    Status New    Target Date 05/19/22      PT SHORT TERM GOAL #3   Title Independent in transfers sit  >/<supine    Baseline -    Time 4    Period Weeks    Status New    Target Date 05/19/22               PT Long Term Goals - 04/21/22 1522       PT LONG TERM GOAL #1   Title Pt will be independent with final HEP    Time 8    Period Weeks    Status New    Target Date 06/16/22      PT LONG TERM GOAL #2   Title Pt will demo at least 4/5 bilat hip strength for improved single leg stability/gait/transfers    Time 8    Period Weeks    Status New    Target Date 06/16/22      PT LONG TERM GOAL #3   Title Pt will be able to amb at least 400' with least restrictive assistive device with </=2/10 pain    Time 8    Period Weeks    Status New    Target Date 06/16/22      PT LONG TERM GOAL #4   Title Independent in all transfers stand </> sit </> supine    Time 8    Period Weeks    Status New    Target Date 06/16/22      PT LONG TERM GOAL #5   Title Improve functional limitation score to 44    Baseline -    Time 8    Period Weeks    Status New    Target Date 06/16/22                   Plan - 05/18/22 1401     Clinical Impression Statement Pt continues to progress well. He is gaining strength and is improving balane. He continues to be most limited by Lt knee pain    PT Next Visit Plan progress HEP as tolerated. continue standing ther ex, gait training    PT Home Exercise Plan [facility]    Consulted and Agree with Plan of  Care Patient             Patient will benefit from skilled therapeutic intervention in order to improve the following deficits and impairments:     Visit Diagnosis: Pain in right hip  Abnormal gait  Muscle weakness (generalized)     Problem List Patient Active Problem List   Diagnosis Date Noted   Allergic sinusitis 04/12/2022   Rotator cuff tendinitis, right 02/03/2022   Primary osteoarthritis of right hip 06/24/2021   Primary osteoarthritis of left knee 03/29/2021   Hamstring strain, left, initial encounter 02/26/2021    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 01/29/2021   OA (osteoarthritis) of knee 08/16/2020   Neoplasm of uncertain behavior of skin 03/12/2020   Tubular adenoma of colon 10/17/2018   Hyperlipidemia 10/12/2018   Greater trochanteric bursitis of both hips 10/12/2018   Morbid obesity ([place]) 10/12/2018   Vitamin D deficiency 08/01/2017   Neurotic excoriations 07/27/2016   Family history of colon cancer requiring screening colonoscopy 05/16/2012   Prediabetes 05/16/2012   Tobacco abuse 07/20/2011   Hypogonadism male 07/20/2011   Essential hypertension, benign 06/29/2011    Ger Nicks, PT 05/18/2022, 2:02 PM  [facility] [place], [place], [address] Phone: [phone]   Fax:  [phone]  Name: Erik Estrada MRN: [id number] Date of Birth: 1951/09/17

## 2022-05-20 ENCOUNTER — Ambulatory Visit: Payer: Medicare Other | Admitting: Physical Therapy

## 2022-05-20 DIAGNOSIS — M6281 Muscle weakness (generalized): Secondary | ICD-10-CM | POA: Diagnosis not present

## 2022-05-20 DIAGNOSIS — R269 Unspecified abnormalities of gait and mobility: Secondary | ICD-10-CM | POA: Diagnosis not present

## 2022-05-20 DIAGNOSIS — M25551 Pain in right hip: Secondary | ICD-10-CM

## 2022-05-20 DIAGNOSIS — R29898 Other symptoms and signs involving the musculoskeletal system: Secondary | ICD-10-CM | POA: Diagnosis not present

## 2022-05-20 DIAGNOSIS — R293 Abnormal posture: Secondary | ICD-10-CM | POA: Diagnosis not present

## 2022-05-20 DIAGNOSIS — M25651 Stiffness of right hip, not elsewhere classified: Secondary | ICD-10-CM | POA: Diagnosis not present

## 2022-05-20 NOTE — Therapy (Signed)
[place], [place], [address] Phone: [phone]   Fax:  [phone]  Physical Therapy Treatment  Patient Details  Name: Erik Estrada MRN: [id number] Date of Birth: 07/11/1951 Referring Provider (PT): Dr Melrose Nakayama   Encounter Date: 05/20/2022 Rationale for Evaluation and Treatment Rehabilitation   PT End of Session - 05/20/22 0946     Visit Number 8    Number of Visits 16    Date for PT Re-Evaluation 06/16/22    Authorization Type Medicare    Authorization - Visit Number 8    Progress Note Due on Visit 10    PT Start Time 0845    PT Stop Time 0925    PT Time Calculation (min) 40 min    Activity Tolerance Patient tolerated treatment well    Behavior During Therapy [facility] for tasks assessed/performed             Past Medical History:  Diagnosis Date   Anxiety    Arthritis    Essential hypertension, benign 06/29/2011   Hemorrhoids 08/01/2018   History of kidney stones    Morbid obesity ([place]) 10/12/2018   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  Plan for 5-year recheck.   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   Subjective Assessment - 05/20/22 0849     Subjective Pt states he was "a little sore" after last visit but is feeling better today.    Patient Stated Goals use Rt hip and leg normally without pain    Currently in Pain? Yes    Pain Score 2     Pain  Location Hip    Pain Orientation Right    Pain Descriptors / Indicators Sore                               [facility] Adult PT Treatment/Exercise - 05/20/22 0001       Ambulation/Gait   Ambulation/Gait Assistance 6: Modified independent (Device/Increase time)    Ambulation Distance (Feet) 180 Feet    Assistive device Straight cane    Gait Comments curb step training to 6'' step with SPC up/down curb x 5 each with focus on technique to prevent pain and maintain safety and balance      Knee/Hip Exercises: Aerobic   Nustep L5 x 6 min for warm up and AAROM      Knee/Hip Exercises: Standing   Heel Raises 20 reps    Heel Raises Limitations at walker    Knee Flexion 20 reps    Knee Flexion Limitations UE support on walker    Hip Abduction 20 reps    Functional Squat 20 reps    Functional Squat Limitations mini squat in walker with minimal use of UEs      Knee/Hip Exercises: Seated   Long Arc Quad Right;2 sets;10 reps    Long [employer]  Weight 2 lbs.    Sit to [employer] 15 reps                       PT Short Term Goals - 04/21/22 1520       PT SHORT TERM GOAL #1   Title Pt will be independent with initial HEP    Time 4    Period Weeks    Status New    Target Date 05/19/22      PT SHORT TERM GOAL #2   Title Full Rt knee extension and +/> Rt hip flexion to 90-100 degrees and extension to 0 to 5 deg    Baseline -    Period Weeks    Status New    Target Date 05/19/22      PT SHORT TERM GOAL #3   Title Independent in transfers sit >/<supine    Baseline -    Time 4    Period Weeks    Status New    Target Date 05/19/22               PT Long Term Goals - 04/21/22 1522       PT LONG TERM GOAL #1   Title Pt will be independent with final HEP    Time 8    Period Weeks    Status New    Target Date 06/16/22      PT LONG TERM GOAL #2   Title Pt will demo at least 4/5 bilat hip strength for improved single leg stability/gait/transfers     Time 8    Period Weeks    Status New    Target Date 06/16/22      PT LONG TERM GOAL #3   Title Pt will be able to amb at least 400' with least restrictive assistive device with </=2/10 pain    Time 8    Period Weeks    Status New    Target Date 06/16/22      PT LONG TERM GOAL #4   Title Independent in all transfers stand </> sit </> supine    Time 8    Period Weeks    Status New    Target Date 06/16/22      PT LONG TERM GOAL #5   Title Improve functional limitation score to 44    Baseline -    Time 8    Period Weeks    Status New    Target Date 06/16/22                   Plan - 05/20/22 0947     Clinical Impression Statement Pt limited by Rt hip pain and LT knee pain during treatment. he requires more frequent rest breaks and has less standing tolerance. Treatment modified due to increased pain.    PT Next Visit Plan progress HEP as tolerated. continue standing ther ex, gait training    PT Home Exercise Plan [facility]    Consulted and Agree with Plan of Care Patient             Patient will benefit from skilled therapeutic intervention in order to improve the following deficits and impairments:     Visit Diagnosis: Pain in right hip  Abnormal gait  Muscle weakness (generalized)     Problem List Patient Active Problem List   Diagnosis Date Noted   Allergic sinusitis 04/12/2022   Rotator cuff tendinitis, right 02/03/2022   Primary osteoarthritis  of right hip 06/24/2021   Primary osteoarthritis of left knee 03/29/2021   Hamstring strain, left, initial encounter 02/26/2021   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 01/29/2021   OA (osteoarthritis) of knee 08/16/2020   Neoplasm of uncertain behavior of skin 03/12/2020   Tubular adenoma of colon 10/17/2018   Hyperlipidemia 10/12/2018   Greater trochanteric bursitis of both hips 10/12/2018   Morbid obesity ([place]) 10/12/2018   Vitamin D deficiency 08/01/2017   Neurotic  excoriations 07/27/2016   Family history of colon cancer requiring screening colonoscopy 05/16/2012   Prediabetes 05/16/2012   Tobacco abuse 07/20/2011   Hypogonadism male 07/20/2011   Essential hypertension, benign 06/29/2011    Hollin Crewe, PT 05/20/2022, 9:52 AM  [facility] [place], [place], [address] Phone: [phone]   Fax:  [phone]  Name: Erik Estrada MRN: [id number] Date of Birth: 16-Sep-1951

## 2022-05-23 ENCOUNTER — Encounter: Payer: Self-pay | Admitting: Rehabilitative and Restorative Service Providers"

## 2022-05-23 ENCOUNTER — Ambulatory Visit: Payer: Medicare Other | Admitting: Rehabilitative and Restorative Service Providers"

## 2022-05-23 DIAGNOSIS — M6281 Muscle weakness (generalized): Secondary | ICD-10-CM | POA: Diagnosis not present

## 2022-05-23 DIAGNOSIS — M25651 Stiffness of right hip, not elsewhere classified: Secondary | ICD-10-CM

## 2022-05-23 DIAGNOSIS — R269 Unspecified abnormalities of gait and mobility: Secondary | ICD-10-CM

## 2022-05-23 DIAGNOSIS — M25551 Pain in right hip: Secondary | ICD-10-CM | POA: Diagnosis not present

## 2022-05-23 DIAGNOSIS — R29898 Other symptoms and signs involving the musculoskeletal system: Secondary | ICD-10-CM

## 2022-05-23 DIAGNOSIS — R293 Abnormal posture: Secondary | ICD-10-CM | POA: Diagnosis not present

## 2022-05-23 NOTE — Patient Instructions (Signed)
Access Code: [email or web address] URL: [email or web address] Date: 05/23/2022 Prepared by: Gillermo Murdoch  Exercises - Supine Quad Set  - 2 x daily - 7 x weekly - 2 sets - 10 reps - 5 sec hold - Supine Heel Slide with Strap  - 2 x daily - 7 x weekly - 1 sets - 10 reps - Supine Gluteal Sets  - 2 x daily - 7 x weekly - 1 sets - 10 reps - 5 sec  hold - Supine Ankle Pumps  - 2 x daily - 7 x weekly - 1 sets - 3 reps - 30 sec  hold - Seated Ankle Circles  - 2 x daily - 7 x weekly - 1-2 sets - 10 reps - 2-3 sec  hold - Ankle Alphabet in Elevation  - 2 x daily - 7 x weekly - 1 sets - 3 reps - Standing Gluteal Sets  - 1 x daily - 7 x weekly - 1-2 sets - 10 reps - 5-10 sec  hold - Beginner Bridge  - 2 x daily - 7 x weekly - 1 sets - 10 reps - 3-5 sec  hold - Hooklying Isometric Clamshell  - 2 x daily - 7 x weekly - 1 sets - 10 reps - 3 sec  hold - Sit to Stand  - 2 x daily - 7 x weekly - 1 sets - 10 reps - 3-5 sec  hold - Standing Shoulder Horizontal Abduction with Resistance  - 2 x daily - 7 x weekly - 2-3 sets - 10 reps - 3 sec  hold - Prone Gluteal Sets  - 2 x daily - 7 x weekly - 1 sets - 10 reps - 10 sec  hold - Prone Quadriceps Set  - 2 x daily - 7 x weekly - 1-2 sets - 10 reps - 5-10 sec  hold

## 2022-05-23 NOTE — Therapy (Signed)
[facility] [place], [place], [address] Phone: [phone]   Fax:  [phone]  Physical Therapy Treatment Rationale for Evaluation and Treatment Rehabilitation  Patient Details  Name: Erik Estrada MRN: [id number] Date of Birth: 18-Jul-1951 Referring Provider (PT): Dr Melrose Nakayama   Encounter Date: 05/23/2022   PT End of Session - 05/23/22 1102     Visit Number 9    Number of Visits 16    Date for PT Re-Evaluation 06/16/22    Authorization Type Medicare    Authorization - Visit Number 9    Progress Note Due on Visit 10    PT Start Time 1058    PT Stop Time 2376    PT Time Calculation (min) 46 min    Activity Tolerance Patient tolerated treatment well             Past Medical History:  Diagnosis Date   Anxiety    Arthritis    Essential hypertension, benign 06/29/2011   Hemorrhoids 08/01/2018   History of kidney stones    Morbid obesity ([place]) 10/12/2018   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  Plan for 5-year recheck.   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   Subjective Assessment - 05/23/22 1106     Subjective Walking some over the weekend with cane on some gravel. Can tell he is getting stronger. ADL's are easier at home.    Currently in Pain? No/denies    Pain Score 0-No pain    Pain Location Hip    Pain Orientation Right    Pain Descriptors / Indicators Sore     Pain Type Surgical pain    Pain Onset More than a month ago    Pain Frequency Intermittent    Pain Score 3    Pain Location Knee    Pain Orientation Left    Pain Descriptors / Indicators Aching;Sharp    Pain Type Chronic pain    Pain Onset More than a month ago    Pain Frequency Constant                [facility] PT Assessment - 05/23/22 0001       Assessment   Medical Diagnosis Rt THA    Referring Provider (PT) Dr Melrose Nakayama    Onset Date/Surgical Date 04/19/22    Hand Dominance Right    Next MD Visit 05/20/22    Prior Therapy L shoulder; Lt knee and Rt hip      Precautions   Precautions Anterior Hip      Strength   Right Hip Flexion 3+/5    Right Hip Extension 3-/5    Right Hip ABduction 3/5    Left Hip Flexion 4/5    Left Hip Extension 3+/5    Left Hip ABduction 3/5      Flexibility   Hamstrings tight bilat    Quadriceps tight bilat  [facility] Adult PT Treatment/Exercise - 05/23/22 0001       Ambulation/Gait   Ambulation/Gait Assistance 6: Modified independent (Device/Increase time)    Ambulation Distance (Feet) 180 Feet    Assistive device Straight cane      Knee/Hip Exercises: Aerobic   Nustep L6 x 10 min for warm up and AAROM      Knee/Hip Exercises: Standing   Hip Extension Stengthening;Right;Left;2 sets;10 reps    Extension Limitations glut set 10 sec x 5 reps standing without UE support      Knee/Hip Exercises: Seated   Sit to Sand 20 reps;without UE support      Knee/Hip Exercises: Supine   Bridges Strengthening;Both;20 reps    Bridges Limitations olding 8# DB at hips    Other Supine Knee/Hip Exercises hip abduction/clam blue TB alternating LE's 3 sec hold x 20 reps      Knee/Hip Exercises: Sidelying   Hip ABduction Strengthening;Left;Right;2 sets;10 reps      Knee/Hip Exercises: Prone   Hip Extension Strengthening;Right;Left;2 sets;10 reps    Hip Extension Limitations through available range     Other Prone Exercises glut set 3 sec x 10 reps    Other Prone Exercises knee flexion ~ 10 reps each side                     PT Education - 05/23/22 1146     Education Details HEP    Person(s) Educated Patient    Methods Explanation;Demonstration;Tactile cues;Verbal cues;Handout    Comprehension Verbalized understanding;Returned demonstration;Verbal cues required;Tactile cues required              PT Short Term Goals - 04/21/22 1520       PT SHORT TERM GOAL #1   Title Pt will be independent with initial HEP    Time 4    Period Weeks    Status New    Target Date 05/19/22      PT SHORT TERM GOAL #2   Title Full Rt knee extension and +/> Rt hip flexion to 90-100 degrees and extension to 0 to 5 deg    Baseline -    Period Weeks    Status New    Target Date 05/19/22      PT SHORT TERM GOAL #3   Title Independent in transfers sit >/<supine    Baseline -    Time 4    Period Weeks    Status New    Target Date 05/19/22               PT Long Term Goals - 04/21/22 1522       PT LONG TERM GOAL #1   Title Pt will be independent with final HEP    Time 8    Period Weeks    Status New    Target Date 06/16/22      PT LONG TERM GOAL #2   Title Pt will demo at least 4/5 bilat hip strength for improved single leg stability/gait/transfers    Time 8    Period Weeks    Status New    Target Date 06/16/22      PT LONG TERM GOAL #3   Title Pt will be able to amb at least 400' with least restrictive assistive device with </=2/10 pain    Time 8    Period Weeks    Status New    Target Date 06/16/22      PT LONG TERM GOAL #  4   Title Independent in all transfers stand </> sit </> supine    Time 8    Period Weeks    Status New    Target Date 06/16/22      PT LONG TERM GOAL #5   Title Improve functional limitation score to 44    Baseline -    Time 8    Period Weeks    Status New    Target Date 06/16/22                   Plan - 05/23/22  1102     Clinical Impression Statement Continued progress with Rt THA rehab - limited strengthening in standing due to Lt knee pain. Functional mobility and ADL's improving. Patient is ambulatory with SPC primarily. He was walking in gravel with cane over the weekend.    Rehab Potential Good    PT Frequency 2x / week    PT Duration 8 weeks    PT Treatment/Interventions ADLs/Self Care Home Management;Aquatic Therapy;Cryotherapy;Electrical Stimulation;Iontophoresis '4mg'$ /ml Dexamethasone;Moist Heat;DME Instruction;Gait training;Stair training;Functional mobility training;Therapeutic activities;Therapeutic exercise;Balance training;Neuromuscular re-education;Manual techniques;Patient/family education;Passive range of motion;Dry needling;Taping    PT Next Visit Plan progress HEP as tolerated. continue standing ther ex, gait training    PT Home Exercise Plan [id number]    [email or web address] and Agree with Plan of Care Patient             Patient will benefit from skilled therapeutic intervention in order to improve the following deficits and impairments:     Visit Diagnosis: Pain in right hip  Abnormal gait  Muscle weakness (generalized)  Stiffness of hip joint, right  Other symptoms and signs involving the musculoskeletal system     Problem List Patient Active Problem List   Diagnosis Date Noted   Allergic sinusitis 04/12/2022   Rotator cuff tendinitis, right 02/03/2022   Primary osteoarthritis of right hip 06/24/2021   Primary osteoarthritis of left knee 03/29/2021   Hamstring strain, left, initial encounter 02/26/2021   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 01/29/2021   OA (osteoarthritis) of knee 08/16/2020   Neoplasm of uncertain behavior of skin 03/12/2020   Tubular adenoma of colon 10/17/2018   Hyperlipidemia 10/12/2018   Greater trochanteric bursitis of both hips 10/12/2018   Morbid obesity ([place]) 10/12/2018   Vitamin D deficiency 08/01/2017    Neurotic excoriations 07/27/2016   Family history of colon cancer requiring screening colonoscopy 05/16/2012   Prediabetes 05/16/2012   Tobacco abuse 07/20/2011   Hypogonadism male 07/20/2011   Essential hypertension, benign 06/29/2011    Lyn Deemer Nilda Simmer, PT, MPH  05/23/2022, 11:49 AM  [facility] [place], [place], [address] Phone: [phone]   Fax:  [phone]  Name: Erik Estrada MRN: [id number] Date of Birth: 22-Feb-1951

## 2022-05-26 ENCOUNTER — Encounter: Payer: Self-pay | Admitting: Rehabilitative and Restorative Service Providers"

## 2022-05-26 ENCOUNTER — Ambulatory Visit: Payer: Medicare Other | Admitting: Rehabilitative and Restorative Service Providers"

## 2022-05-26 DIAGNOSIS — M25651 Stiffness of right hip, not elsewhere classified: Secondary | ICD-10-CM

## 2022-05-26 DIAGNOSIS — M6281 Muscle weakness (generalized): Secondary | ICD-10-CM | POA: Diagnosis not present

## 2022-05-26 DIAGNOSIS — R29898 Other symptoms and signs involving the musculoskeletal system: Secondary | ICD-10-CM

## 2022-05-26 DIAGNOSIS — R293 Abnormal posture: Secondary | ICD-10-CM

## 2022-05-26 DIAGNOSIS — R269 Unspecified abnormalities of gait and mobility: Secondary | ICD-10-CM | POA: Diagnosis not present

## 2022-05-26 DIAGNOSIS — M25551 Pain in right hip: Secondary | ICD-10-CM | POA: Diagnosis not present

## 2022-05-26 NOTE — Therapy (Signed)
[facility] [address] [place], [place], [address] Phone: [phone]   Fax:  [phone]  Physical Therapy Treatment and Medicare 10th visit note Rationale for Evaluation and Treatment Rehabilitation Progress Note Reporting Period 04/21/22 to 05/26/22  See note below for Objective Data and Assessment of Progress/Goals.     Patient Details  Name: Erik Estrada MRN: [id number] Date of Birth: [birth year] Referring Provider (PT): Dr Melrose Nakayama   Encounter Date: 05/26/2022   PT End of Session - 05/26/22 1101     Visit Number 10    Number of Visits 16    Date for PT Re-Evaluation 06/16/22    Authorization Type Medicare    Authorization - Visit Number 10    Progress Note Due on Visit 10    PT Start Time 1950    PT Stop Time 9326    PT Time Calculation (min) 47 min    Activity Tolerance Patient tolerated treatment well             Past Medical History:  Diagnosis Date   Anxiety    Arthritis    Essential hypertension, benign 06/29/2011   Hemorrhoids 08/01/2018   History of kidney stones    Morbid obesity ([place]) 10/12/2018   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  Plan for 5-year recheck.   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   Subjective Assessment - 05/26/22 1102     Subjective Patient reports that his Rt hip is doing well. He is walking up and down his steps and gaining strength.  ADL's are improving. His Lt knee continues to hurt. He is wearing his knee brace when walking.    Currently in Pain? No/denies    Pain Score 0-No pain    Pain Location Hip    Pain Orientation Right    Pain Onset More than a month ago    Pain Frequency Intermittent    Aggravating Factors  movement; standing; walking    Pain Relieving Factors medication; ice    Pain Score 4    Pain Location Knee    Pain Orientation Left    Pain Descriptors / Indicators Aching;Sharp    Pain Type Chronic pain    Pain Onset More than a month ago    Pain Frequency Constant    Aggravating Factors  prolonged standing; walking    Pain Relieving Factors sitting; resting                [facility] PT Assessment - 05/26/22 0001       Assessment   Medical Diagnosis Rt THA    Referring Provider (PT) Dr Melrose Nakayama    Onset Date/Surgical Date 04/19/22    Hand Dominance Right    Next MD Visit 05/20/22    Prior Therapy L shoulder; Lt knee and Rt hip      Strength   Right Hip Flexion 3+/5    Right Hip Extension  3-/5    Right Hip ABduction 3/5    Left Hip Flexion 4/5    Left Hip Extension 3+/5    Left Hip ABduction 3/5    Right Knee Flexion 4+/5    Right Knee Extension 4+/5    Left Knee Flexion 4/5    Left Knee Extension 3+/5      Flexibility   Hamstrings tight bilat    Quadriceps tight bilat      Ambulation/Gait   Ambulation/Gait Assistance 6: Modified independent (Device/Increase time)    Ambulation Distance (Feet) 180 Feet    Assistive device Straight cane                           [facility] Adult PT Treatment/Exercise - 05/26/22 1118       Ambulation/Gait   Ambulation/Gait Assistance --    Ambulation Distance (Feet) --    Assistive device --                       PT Short Term Goals - 05/26/22 1106       PT SHORT TERM GOAL #1   Title Pt will be independent with initial HEP    Time 4    Period Weeks    Status Achieved    Target Date 05/19/22      PT  SHORT TERM GOAL #2   Title Full Rt knee extension and +/> Rt hip flexion to 90-100 degrees and extension to 0 to 5 deg    Time 4    Period Weeks    Status Partially Met    Target Date 05/19/22      PT SHORT TERM GOAL #3   Title Independent in transfers sit >/<supine    Time 4    Period Weeks    Status Achieved    Target Date 05/19/22               PT Long Term Goals - 05/26/22 1107       PT LONG TERM GOAL #1   Title Pt will be independent with final HEP    Time 8    Period Weeks    Status On-going    Target Date 06/16/22      PT LONG TERM GOAL #2   Title Pt will demo at least 4/5 bilat hip strength for improved single leg stability/gait/transfers    Time 8    Period Weeks    Status On-going    Target Date 06/16/22      PT LONG TERM GOAL #3   Title Pt will be able to amb at least 400' with least restrictive assistive device with </=2/10 pain    Time 8    Period Weeks    Status On-going    Target Date 06/16/22      PT LONG TERM GOAL #4   Title Independent in all transfers stand </> sit </> supine    Time 8    Period Weeks    Status On-going    Target Date 06/16/22      PT LONG TERM GOAL #5   Title Improve functional limitation score to 44    Time 8    Period Weeks    Status On-going    Target Date 06/16/22                   Plan - 05/26/22 1110     Clinical Impression  Statement Progressing well with Rt THA. He continues to have limited standing tolerance due to Lt knee pain. Functional mobility and ADL's continue to improve. Patient is ambulatory with SPC on level and some unlevel surfaces. Progressing well toward stated goals of therapy.    Rehab Potential Good    PT Frequency 2x / week    PT Duration 8 weeks    PT Treatment/Interventions ADLs/Self Care Home Management;Aquatic Therapy;Cryotherapy;Electrical Stimulation;Iontophoresis 4mg /ml Dexamethasone;Moist Heat;DME Instruction;Gait training;Stair training;Functional mobility  training;Therapeutic activities;Therapeutic exercise;Balance training;Neuromuscular re-education;Manual techniques;Patient/family education;Passive range of motion;Dry needling;Taping    PT Next Visit Plan progress HEP as tolerated. continue standing ther ex, gait training    PT Home Exercise Plan [id number]    [email or web address] and Agree with Plan of Care Patient             Patient will benefit from skilled therapeutic intervention in order to improve the following deficits and impairments:     Visit Diagnosis: Pain in right hip  Abnormal gait  Muscle weakness (generalized)  Stiffness of hip joint, right  Other symptoms and signs involving the musculoskeletal system  Abnormal posture     Problem List Patient Active Problem List   Diagnosis Date Noted   Allergic sinusitis 04/12/2022   Rotator cuff tendinitis, right 02/03/2022   Primary osteoarthritis of right hip 06/24/2021   Primary osteoarthritis of left knee 03/29/2021   Hamstring strain, left, initial encounter 02/26/2021   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 01/29/2021   OA (osteoarthritis) of knee 08/16/2020   Neoplasm of uncertain behavior of skin 03/12/2020   Tubular adenoma of colon 10/17/2018   Hyperlipidemia 10/12/2018   Greater trochanteric bursitis of both hips 10/12/2018   Morbid obesity ([place]) 10/12/2018   Vitamin D deficiency 08/01/2017   Neurotic excoriations 07/27/2016   Family history of colon cancer requiring screening colonoscopy 05/16/2012   Prediabetes 05/16/2012   Tobacco abuse 07/20/2011   Hypogonadism male 07/20/2011   Essential hypertension, benign 06/29/2011    Taiyo Kozma Nilda Simmer, PT, MPH  05/26/2022, 11:51 AM  [facility] [place], [place], [address] Phone: [phone]   Fax:  [phone]  Name: Erik Estrada MRN: [id number] Date of Birth: 09/30/1951

## 2022-05-30 ENCOUNTER — Ambulatory Visit: Payer: Medicare Other | Admitting: Rehabilitative and Restorative Service Providers"

## 2022-06-02 ENCOUNTER — Ambulatory Visit: Payer: Medicare Other | Admitting: Physical Therapy

## 2022-06-02 ENCOUNTER — Encounter: Payer: Self-pay | Admitting: Physical Therapy

## 2022-06-02 DIAGNOSIS — M25662 Stiffness of left knee, not elsewhere classified: Secondary | ICD-10-CM

## 2022-06-02 DIAGNOSIS — G8929 Other chronic pain: Secondary | ICD-10-CM

## 2022-06-02 DIAGNOSIS — R269 Unspecified abnormalities of gait and mobility: Secondary | ICD-10-CM

## 2022-06-02 DIAGNOSIS — R29898 Other symptoms and signs involving the musculoskeletal system: Secondary | ICD-10-CM | POA: Diagnosis not present

## 2022-06-02 DIAGNOSIS — M25551 Pain in right hip: Secondary | ICD-10-CM

## 2022-06-02 DIAGNOSIS — R600 Localized edema: Secondary | ICD-10-CM

## 2022-06-02 DIAGNOSIS — R293 Abnormal posture: Secondary | ICD-10-CM | POA: Diagnosis not present

## 2022-06-02 DIAGNOSIS — M6281 Muscle weakness (generalized): Secondary | ICD-10-CM | POA: Diagnosis not present

## 2022-06-02 DIAGNOSIS — M25651 Stiffness of right hip, not elsewhere classified: Secondary | ICD-10-CM

## 2022-06-02 NOTE — Therapy (Signed)
[place]str[place], [place], [address] Phone: [phone]   Fax:  [phone]  Physical Therapy Treatment  Patient Details  Name: Erik Estrada MRN: [id number] Date of Birth: Oct 16, 1951 Referring Provider (PT): Dr Melrose Nakayama   Encounter Date: 06/02/2022  Rationale for Evaluation and Treatment Rehabilitation   PT End of Session - 06/02/22 1107     Visit Number 11    Number of Visits 16    Date for PT Re-Evaluation 06/16/22    Authorization Type Medicare    Progress Note Due on Visit 50    PT Start Time 1103    PT Stop Time 3354    PT Time Calculation (min) 42 min    Activity Tolerance Patient tolerated treatment well    Behavior During Therapy [facility] for tasks assessed/performed             Past Medical History:  Diagnosis Date   Anxiety    Arthritis    Essential hypertension, benign 06/29/2011   Hemorrhoids 08/01/2018   History of kidney stones    Morbid obesity ([place]) 10/12/2018   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  Plan for 5-year recheck.   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   Subjective Assessment - 06/02/22 1108     Subjective Patient reporting some pain with shaving and brushing teeth because he shifts wt to R side due to L knee pain. He skipped one day of his exercises and he was really stiff the next day.    Patient Stated Goals use Rt hip and leg normally  without pain    Currently in Pain? Yes    Pain Score 3     Pain Location Hip    Pain Orientation Right    Pain Descriptors / Indicators Sore    Pain Score 7    Pain Location Knee    Pain Orientation Left    Pain Descriptors / Indicators Aching                [facility] PT Assessment - 06/02/22 0001       Strength   Right Hip Extension 4+/5    Left Hip Extension 4/5   in available range                          [facility] Adult PT Treatment/Exercise - 06/02/22 0001       Self-Care   Self-Care Other Self-Care Comments    Other Self-Care Comments  MFR with ball to right gluteals      Knee/Hip Exercises: Aerobic   Nustep L6 x 10 min      Knee/Hip Exercises: Standing   Other Standing Knee Exercises glute sets 5 sec hold x 20; required AD due to knee pain      Knee/Hip Exercises: Seated   Sit to Sand 20 reps;without UE support   4x5  Knee/Hip Exercises: Supine   Bridges Strengthening;Both;20 reps    Other Supine Knee/Hip Exercises hip abduction/clam blue TB alternating LE's 3 sec hold x 20 reps      Knee/Hip Exercises: Sidelying   Hip ABduction Right;10 reps    Hip ABduction Limitations limited by hip gluteal pain      Knee/Hip Exercises: Prone   Hip Extension Strengthening;Right;Left;2 sets;10 reps    Hip Extension Limitations through available range/glut set with knee extension to activate posterior chain through lumbar spine, hips, HS                     PT Education - 06/02/22 1259     Education Details STM with ball    Person(s) Educated Patient    Methods Explanation;Demonstration;Verbal cues    Comprehension Verbalized understanding;Returned demonstration              PT Short Term Goals - 05/26/22 1106       PT SHORT TERM GOAL #1   Title Pt will be independent with initial HEP    Time 4    Period Weeks    Status Achieved    Target Date 05/19/22      PT SHORT TERM GOAL #2   Title Full Rt knee extension and +/> Rt  hip flexion to 90-100 degrees and extension to 0 to 5 deg    Time 4    Period Weeks    Status Partially Met    Target Date 05/19/22      PT SHORT TERM GOAL #3   Title Independent in transfers sit >/<supine    Time 4    Period Weeks    Status Achieved    Target Date 05/19/22               PT Long Term Goals - 05/26/22 1107       PT LONG TERM GOAL #1   Title Pt will be independent with final HEP    Time 8    Period Weeks    Status On-going    Target Date 06/16/22      PT LONG TERM GOAL #2   Title Pt will demo at least 4/5 bilat hip strength for improved single leg stability/gait/transfers    Time 8    Period Weeks    Status On-going    Target Date 06/16/22      PT LONG TERM GOAL #3   Title Pt will be able to amb at least 400' with least restrictive assistive device with </=2/10 pain    Time 8    Period Weeks    Status On-going    Target Date 06/16/22      PT LONG TERM GOAL #4   Title Independent in all transfers stand </> sit </> supine    Time 8    Period Weeks    Status On-going    Target Date 06/16/22      PT LONG TERM GOAL #5   Title Improve functional limitation score to 44    Time 8    Period Weeks    Status On-going    Target Date 06/16/22                   Plan - 06/02/22 1257     Clinical Impression Statement [name] had increased knee pain today which limited standing therex for hip. He has increased his hip extension strength considerably from last assessment but is still limited with  left hip extension. Increased hip pain due to spending more time on R. Re-educated in STM with ball for HEP.    PT Frequency 2x / week    PT Duration 8 weeks    PT Treatment/Interventions ADLs/Self Care Home Management;Aquatic Therapy;Cryotherapy;Electrical Stimulation;Iontophoresis 88m/ml Dexamethasone;Moist Heat;DME Instruction;Gait training;Stair training;Functional mobility training;Therapeutic activities;Therapeutic exercise;Balance  training;Neuromuscular re-education;Manual techniques;Patient/family education;Passive range of motion;Dry needling;Taping    PT Next Visit Plan progress HEP as tolerated. continue standing ther ex, gait training    [facility]            Patient will benefit from skilled therapeutic intervention in order to improve the following deficits and impairments:  Abnormal gait, Decreased range of motion, Increased fascial restricitons, Difficulty walking, Decreased endurance, Increased muscle spasms, Decreased activity tolerance, Pain, Decreased balance, Hypomobility, Impaired flexibility, Decreased mobility, Decreased strength, Increased edema, Postural dysfunction  Visit Diagnosis: Pain in right hip  Abnormal gait  Muscle weakness (generalized)  Stiffness of hip joint, right  Other symptoms and signs involving the musculoskeletal system  Abnormal posture  Localized edema  Chronic pain of left knee  Stiffness of left knee, not elsewhere classified     Problem List Patient Active Problem List   Diagnosis Date Noted   Allergic sinusitis 04/12/2022   Rotator cuff tendinitis, right 02/03/2022   Primary osteoarthritis of right hip 06/24/2021   Primary osteoarthritis of left knee 03/29/2021   Hamstring strain, left, initial encounter 02/26/2021   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 01/29/2021   OA (osteoarthritis) of knee 08/16/2020   Neoplasm of uncertain behavior of skin 03/12/2020   Tubular adenoma of colon 10/17/2018   Hyperlipidemia 10/12/2018   Greater trochanteric bursitis of both hips 10/12/2018   [place] ([place] 10/12/2018   Vitamin D deficiency 08/01/2017   Neurotic excoriations 07/27/2016   Family history of colon cancer requiring screening colonoscopy 05/16/2012   Prediabetes 05/16/2012   Tobacco abuse 07/20/2011   Hypogonadism male 07/20/2011   Essential hypertension, benign 06/29/2011   JMadelyn Flavors  PT 06/02/2022, 1:03 PM  [place] [address] [address]Phone: [phone]  Fax:  [phone] Name: TJamir RoneMRN: [id number] of Birth: 1Jul 30, 1952

## 2022-06-09 ENCOUNTER — Ambulatory Visit: Payer: Medicare Other | Admitting: Rehabilitative and Restorative Service Providers"

## 2022-06-09 ENCOUNTER — Encounter: Payer: Self-pay | Admitting: Rehabilitative and Restorative Service Providers"

## 2022-06-09 DIAGNOSIS — R269 Unspecified abnormalities of gait and mobility: Secondary | ICD-10-CM | POA: Diagnosis not present

## 2022-06-09 DIAGNOSIS — M6281 Muscle weakness (generalized): Secondary | ICD-10-CM

## 2022-06-09 DIAGNOSIS — M25651 Stiffness of right hip, not elsewhere classified: Secondary | ICD-10-CM

## 2022-06-09 DIAGNOSIS — M25551 Pain in right hip: Secondary | ICD-10-CM

## 2022-06-09 DIAGNOSIS — R29898 Other symptoms and signs involving the musculoskeletal system: Secondary | ICD-10-CM

## 2022-06-09 DIAGNOSIS — R600 Localized edema: Secondary | ICD-10-CM

## 2022-06-09 DIAGNOSIS — R293 Abnormal posture: Secondary | ICD-10-CM | POA: Diagnosis not present

## 2022-06-09 NOTE — Therapy (Signed)
[place], [place], [address] Phone: [phone]   Fax:  [phone]  Physical Therapy Treatment Rationale for Evaluation and Treatment Rehabilitation  Patient Details  Name: Erik Estrada MRN: [id number] Date of Birth: Dec 06, 1951 Referring Provider (PT): Dr Melrose Nakayama   Encounter Date: 06/09/2022   PT End of Session - 06/09/22 1101     Visit Number 12    Number of Visits 16    Authorization Type Medicare    Authorization - Visit Number 20    Progress Note Due on Visit 20    PT Start Time 1058    PT Stop Time 0630    PT Time Calculation (min) 47 min    Activity Tolerance Patient tolerated treatment well             Past Medical History:  Diagnosis Date   Anxiety    Arthritis    Essential hypertension, benign 06/29/2011   Hemorrhoids 08/01/2018   History of kidney stones    Morbid obesity ([place]) 10/12/2018   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  Plan for 5-year recheck.   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   Subjective Assessment - 06/09/22 1102     Subjective Has been to the pool to exercise some this week. Feels good because it helps support his weight. Having some pain in the hip getting in and out of his truck but is doing well overall.    Currently in Pain? No/denies    Pain Score 0-No pain    Pain Location Hip    Pain Orientation Right    Pain  Onset More than a month ago    Pain Frequency Intermittent                [facility] PT Assessment - 06/09/22 0001       Assessment   Medical Diagnosis Rt THA    Referring Provider (PT) Dr Melrose Nakayama    Onset Date/Surgical Date 04/19/22    Hand Dominance Right    Next MD Visit 06/17/22    Prior Therapy L shoulder; Lt knee and Rt hip      Observation/Other Assessments   Focus on Therapeutic Outcomes (FOTO)  41      Strength   Right Hip Extension 4+/5    Left Hip Extension 4/5   in available range     Flexibility   Hamstrings tight bilat    Quadriceps tight bilat      Ambulation/Gait   Ambulation/Gait Assistance 6: Modified independent (Device/Increase time)    Ambulation Distance (Feet) 120 Feet    Assistive device Straight cane    Gait Pattern Step-through pattern    Ambulation Surface Level                           [facility] Adult PT Treatment/Exercise - 06/09/22 0001  Knee/Hip Exercises: Aerobic   Nustep L6 x 7.5 min      Knee/Hip Exercises: Standing   Heel Raises 20 reps    Forward Step Up Right;10 reps;Hand Hold: 2;Step Height: 6"    Functional Squat 20 reps    Functional Squat Limitations UE support as needed    SLS Rt LE 15 sec x 3 reps UE support as needed      Knee/Hip Exercises: Seated   Sit to Sand 20 reps;without UE support   4x5     Knee/Hip Exercises: Sidelying   Hip ABduction Right;10 reps    Hip ABduction Limitations some Rt posterior hip pain pt reports as a "cramp"      Knee/Hip Exercises: Prone   Hip Extension Strengthening;Right;Left;2 sets;10 reps    Hip Extension Limitations through available range/glut set with knee extension to activate posterior chain through lumbar spine, hips, HS    Other Prone Exercises glut set 10 sec x 20 reps    Other Prone Exercises knee flexion ~ 20 reps each side                       PT Short Term Goals - 05/26/22 1106       PT SHORT TERM GOAL #1   Title Pt will be  independent with initial HEP    Time 4    Period Weeks    Status Achieved    Target Date 05/19/22      PT SHORT TERM GOAL #2   Title Full Rt knee extension and +/> Rt hip flexion to 90-100 degrees and extension to 0 to 5 deg    Time 4    Period Weeks    Status Partially Met    Target Date 05/19/22      PT SHORT TERM GOAL #3   Title Independent in transfers sit >/<supine    Time 4    Period Weeks    Status Achieved    Target Date 05/19/22               PT Long Term Goals - 05/26/22 1107       PT LONG TERM GOAL #1   Title Pt will be independent with final HEP    Time 8    Period Weeks    Status On-going    Target Date 06/16/22      PT LONG TERM GOAL #2   Title Pt will demo at least 4/5 bilat hip strength for improved single leg stability/gait/transfers    Time 8    Period Weeks    Status On-going    Target Date 06/16/22      PT LONG TERM GOAL #3   Title Pt will be able to amb at least 400' with least restrictive assistive device with </=2/10 pain    Time 8    Period Weeks    Status On-going    Target Date 06/16/22      PT LONG TERM GOAL #4   Title Independent in all transfers stand </> sit </> supine    Time 8    Period Weeks    Status On-going    Target Date 06/16/22      PT LONG TERM GOAL #5   Title Improve functional limitation score to 44    Time 8    Period Weeks    Status On-going    Target Date 06/16/22  Plan - 06/09/22 1115     Clinical Impression Statement Rt hip is doing well overall. Patient has been walking and exercising in the water this week. Discussed exercises for water. Added resistance for hip extension with bridging. Continue with strengthening and gait training. Progressing well with Rt hip rehab. Continued limitations with rehab due to Lt knee pain.    Rehab Potential Good    PT Frequency 2x / week    PT Duration 8 weeks    PT Treatment/Interventions ADLs/Self Care Home Management;Aquatic  Therapy;Cryotherapy;Electrical Stimulation;Iontophoresis 70m/ml Dexamethasone;Moist Heat;DME Instruction;Gait training;Stair training;Functional mobility training;Therapeutic activities;Therapeutic exercise;Balance training;Neuromuscular re-education;Manual techniques;Patient/family education;Passive range of motion;Dry needling;Taping    PT Next Visit Plan progress HEP as tolerated. continue standing ther ex, gait training    PT Home Exercise Plan [id number]    [id number] Agree with Plan of Care Patient             Patient will benefit from skilled therapeutic intervention in order to improve the following deficits and impairments:     Visit Diagnosis: Pain in right hip  Abnormal gait  Muscle weakness (generalized)  Stiffness of hip joint, right  Other symptoms and signs involving the musculoskeletal system  Abnormal posture  Localized edema     Problem List Patient Active Problem List   Diagnosis Date Noted   Allergic sinusitis 04/12/2022   Rotator cuff tendinitis, right 02/03/2022   Primary osteoarthritis of right hip 06/24/2021   Primary osteoarthritis of left knee 03/29/2021   Hamstring strain, left, initial encounter 02/26/2021   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 01/29/2021   OA (osteoarthritis) of knee 08/16/2020   Neoplasm of uncertain behavior of skin 03/12/2020   Tubular adenoma of colon 10/17/2018   Hyperlipidemia 10/12/2018   Greater trochanteric bursitis of both hips 10/12/2018   [place]   Vitamin D deficiency 08/01/2017   Neurotic excoriations 07/27/2016   Family history of colon cancer requiring screening colonoscopy 05/16/2012   Prediabetes 05/16/2012   Tobacco abuse 07/20/2011   Hypogonadism male 07/20/2011   Essential hypertension, benign 06/29/2011    Lee-Ann Gal PNilda Simmer PT, MPH  06/09/2022, 11:44 AM  [facility] [address] [address]Phone: [phone]  Fax:  [phone] Name: TMikhai BienvenueMRN: [id number] of Birth: 110/28/1952

## 2022-06-12 ENCOUNTER — Other Ambulatory Visit: Payer: Self-pay | Admitting: Family Medicine

## 2022-06-16 ENCOUNTER — Ambulatory Visit: Payer: Medicare Other | Attending: Family Medicine | Admitting: Rehabilitative and Restorative Service Providers"

## 2022-06-16 ENCOUNTER — Encounter: Payer: Self-pay | Admitting: Rehabilitative and Restorative Service Providers"

## 2022-06-16 DIAGNOSIS — M25562 Pain in left knee: Secondary | ICD-10-CM | POA: Insufficient documentation

## 2022-06-16 DIAGNOSIS — M25651 Stiffness of right hip, not elsewhere classified: Secondary | ICD-10-CM | POA: Diagnosis not present

## 2022-06-16 DIAGNOSIS — R600 Localized edema: Secondary | ICD-10-CM | POA: Insufficient documentation

## 2022-06-16 DIAGNOSIS — R293 Abnormal posture: Secondary | ICD-10-CM | POA: Diagnosis not present

## 2022-06-16 DIAGNOSIS — M6281 Muscle weakness (generalized): Secondary | ICD-10-CM | POA: Diagnosis not present

## 2022-06-16 DIAGNOSIS — G8929 Other chronic pain: Secondary | ICD-10-CM | POA: Diagnosis not present

## 2022-06-16 DIAGNOSIS — M25551 Pain in right hip: Secondary | ICD-10-CM | POA: Diagnosis not present

## 2022-06-16 DIAGNOSIS — R269 Unspecified abnormalities of gait and mobility: Secondary | ICD-10-CM | POA: Diagnosis not present

## 2022-06-16 DIAGNOSIS — R29898 Other symptoms and signs involving the musculoskeletal system: Secondary | ICD-10-CM | POA: Insufficient documentation

## 2022-06-16 NOTE — Therapy (Signed)
[place], [place], [address] Phone: [phone]   Fax:  [phone]  Physical Therapy Treatment  Rationale for Evaluation and Treatment Rehabilitation  Patient Details  Name: Erik Estrada MRN: [id number] Date of Birth: 04-May-1951 Referring Provider (PT): Dr Melrose Nakayama   Encounter Date: 06/16/2022   PT End of Session - 06/16/22 1507     Visit Number 13    Number of Visits 16    Date for PT Re-Evaluation 06/16/22    Authorization Type Medicare    Authorization - Visit Number 20    Progress Note Due on Visit 20    PT Start Time 8921    PT Stop Time 1941    PT Time Calculation (min) 45 min    Activity Tolerance Patient tolerated treatment well             Past Medical History:  Diagnosis Date   Anxiety    Arthritis    Essential hypertension, benign 06/29/2011   Hemorrhoids 08/01/2018   History of kidney stones    Morbid obesity ([place]) 10/12/2018   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  Plan for 5-year recheck.   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   Subjective Assessment - 06/16/22 1508     Subjective Has been to the pool to exercise some this week. Feels good because it helps support his weight. Lt knee is hurting. Having some swelling in both ankles and feet. Sees MD tomorrow.    Currently in Pain? No/denies    Pain Score 0-No pain    Pain Location Hip     Pain Orientation Right    Pain Descriptors / Indicators Tightness                [facility] PT Assessment - 06/16/22 0001       Assessment   Medical Diagnosis Rt THA    Referring Provider (PT) Dr Melrose Nakayama    Onset Date/Surgical Date 04/19/22    Hand Dominance Right    Next MD Visit 06/17/22    Prior Therapy L shoulder; Lt knee and Rt hip      Observation/Other Assessments   Focus on Therapeutic Outcomes (FOTO)  42      Strength   Right Hip Flexion 4+/5    Right Hip Extension 4+/5    Right Hip ABduction 4-/5    Left Hip Flexion 4/5    Left Hip Extension 4/5   in available range   Left Hip ABduction 4-/5    Right Knee Flexion 4+/5    Right Knee Extension 4+/5    Left Knee Flexion 4/5    Left Knee Extension 3+/5      Flexibility   Hamstrings tight bilat    Quadriceps tight bilat      Ambulation/Gait   Ambulation/Gait Assistance 6: Modified independent (Device/Increase time)    Ambulation Distance (Feet) 120 Feet  Assistive device Straight cane    Gait Pattern Step-through pattern    Ambulation Surface Level    Stairs Yes    Stairs Assistance 7: Independent    Stair Management Technique One rail Right;Step to pattern;Forwards;With cane    Number of Stairs 6                           [facility] Adult PT Treatment/Exercise - 06/16/22 0001       Knee/Hip Exercises: Standing   Heel Raises 20 reps    Forward Step Up Right;10 reps;Hand Hold: 2;Step Height: 6"    Functional Squat 20 reps    Functional Squat Limitations UE support as needed    SLS Rt LE 15 sec x 3 reps UE support as needed      Knee/Hip Exercises: Seated   Sit to Sand 20 reps;without UE support   4x5     Knee/Hip Exercises: Supine   Bridges Strengthening;Both;20 reps    Other Supine Knee/Hip Exercises hip abduction/clam blue TB alternating LE's 3 sec hold x 20 reps      Knee/Hip Exercises: Sidelying   Hip ABduction Right;10 reps      Knee/Hip Exercises: Prone   Hip  Extension Strengthening;Right;Left;2 sets;10 reps    Hip Extension Limitations through available range/glut set with knee extension to activate posterior chain through lumbar spine, hips, HS    Other Prone Exercises glut set 10 sec x 20 reps    Other Prone Exercises knee flexion ~ 20 reps each side                       PT Short Term Goals - 05/26/22 1106       PT SHORT TERM GOAL #1   Title Pt will be independent with initial HEP    Time 4    Period Weeks    Status Achieved    Target Date 05/19/22      PT SHORT TERM GOAL #2   Title Full Rt knee extension and +/> Rt hip flexion to 90-100 degrees and extension to 0 to 5 deg    Time 4    Period Weeks    Status Partially Met    Target Date 05/19/22      PT SHORT TERM GOAL #3   Title Independent in transfers sit >/<supine    Time 4    Period Weeks    Status Achieved    Target Date 05/19/22               PT Long Term Goals - 05/26/22 1107       PT LONG TERM GOAL #1   Title Pt will be independent with final HEP    Time 8    Period Weeks    Status On-going    Target Date 06/16/22      PT LONG TERM GOAL #2   Title Pt will demo at least 4/5 bilat hip strength for improved single leg stability/gait/transfers    Time 8    Period Weeks    Status On-going    Target Date 06/16/22      PT LONG TERM GOAL #3   Title Pt will be able to amb at least 400' with least restrictive assistive device with </=2/10 pain    Time 8    Period Weeks    Status On-going    Target Date 06/16/22  PT LONG TERM GOAL #4   Title Independent in all transfers stand </> sit </> supine    Time 8    Period Weeks    Status On-going    Target Date 06/16/22      PT LONG TERM GOAL #5   Title Improve functional limitation score to 44    Time 8    Period Weeks    Status On-going    Target Date 06/16/22                   Plan - 06/16/22 1515     Clinical Impression Statement Rt hip is progressing well with rehab.  [name] has been walking and exercising in the water in the past two weeks and has continued with his HEP. Lt knee pain limits rehab tolerance. He is ambulating with SPC in the community and is now walking without assistive device some in his home on level surfaces. He is progressing well.    Rehab Potential Good    PT Frequency 2x / week    PT Duration 8 weeks    PT Treatment/Interventions ADLs/Self Care Home Management;Aquatic Therapy;Cryotherapy;Electrical Stimulation;Iontophoresis 4mg /ml Dexamethasone;Moist Heat;DME Instruction;Gait training;Stair training;Functional mobility training;Therapeutic activities;Therapeutic exercise;Balance training;Neuromuscular re-education;Manual techniques;Patient/family education;Passive range of motion;Dry needling;Taping    PT Next Visit Plan progress HEP as tolerated. continue standing ther ex, gait training    PT Home Exercise Plan [facility]    Consulted and Agree with Plan of Care Patient    Family Member Consulted Jenny Reichmann - wife             Patient will benefit from skilled therapeutic intervention in order to improve the following deficits and impairments:     Visit Diagnosis: Pain in right hip  Abnormal gait  Muscle weakness (generalized)  Stiffness of hip joint, right  Other symptoms and signs involving the musculoskeletal system  Abnormal posture  Localized edema  Chronic pain of left knee     Problem List Patient Active Problem List   Diagnosis Date Noted   Allergic sinusitis 04/12/2022   Rotator cuff tendinitis, right 02/03/2022   Primary osteoarthritis of right hip 06/24/2021   Primary osteoarthritis of left knee 03/29/2021   Hamstring strain, left, initial encounter 02/26/2021   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 01/29/2021   OA (osteoarthritis) of knee 08/16/2020   Neoplasm of uncertain behavior of skin 03/12/2020   Tubular adenoma of colon 10/17/2018   Hyperlipidemia 10/12/2018    Greater trochanteric bursitis of both hips 10/12/2018   Morbid obesity ([place]) 10/12/2018   Vitamin D deficiency 08/01/2017   Neurotic excoriations 07/27/2016   Family history of colon cancer requiring screening colonoscopy 05/16/2012   Prediabetes 05/16/2012   Tobacco abuse 07/20/2011   Hypogonadism male 07/20/2011   Essential hypertension, benign 06/29/2011    Nechama Escutia Nilda Simmer, PT, MPH 06/16/2022, 3:34 PM  [facility] [place], [place], [address] Phone: [phone]   Fax:  [phone]  Name: Sparsh Callens MRN: [id number] Date of Birth: 06-09-1951

## 2022-06-17 DIAGNOSIS — M1712 Unilateral primary osteoarthritis, left knee: Secondary | ICD-10-CM | POA: Diagnosis not present

## 2022-06-23 ENCOUNTER — Ambulatory Visit (INDEPENDENT_AMBULATORY_CARE_PROVIDER_SITE_OTHER): Payer: Medicare Other | Admitting: Family Medicine

## 2022-06-23 ENCOUNTER — Ambulatory Visit: Payer: Medicare Other | Admitting: Rehabilitative and Restorative Service Providers"

## 2022-06-23 ENCOUNTER — Encounter: Payer: Self-pay | Admitting: Family Medicine

## 2022-06-23 ENCOUNTER — Encounter: Payer: Self-pay | Admitting: Rehabilitative and Restorative Service Providers"

## 2022-06-23 ENCOUNTER — Other Ambulatory Visit: Payer: Self-pay | Admitting: Family Medicine

## 2022-06-23 DIAGNOSIS — R29898 Other symptoms and signs involving the musculoskeletal system: Secondary | ICD-10-CM

## 2022-06-23 DIAGNOSIS — M546 Pain in thoracic spine: Secondary | ICD-10-CM | POA: Diagnosis not present

## 2022-06-23 DIAGNOSIS — R269 Unspecified abnormalities of gait and mobility: Secondary | ICD-10-CM

## 2022-06-23 DIAGNOSIS — R293 Abnormal posture: Secondary | ICD-10-CM | POA: Diagnosis not present

## 2022-06-23 DIAGNOSIS — M6281 Muscle weakness (generalized): Secondary | ICD-10-CM | POA: Diagnosis not present

## 2022-06-23 DIAGNOSIS — M25651 Stiffness of right hip, not elsewhere classified: Secondary | ICD-10-CM

## 2022-06-23 DIAGNOSIS — R339 Retention of urine, unspecified: Secondary | ICD-10-CM | POA: Insufficient documentation

## 2022-06-23 DIAGNOSIS — Z720 Tobacco use: Secondary | ICD-10-CM

## 2022-06-23 DIAGNOSIS — M549 Dorsalgia, unspecified: Secondary | ICD-10-CM | POA: Insufficient documentation

## 2022-06-23 DIAGNOSIS — I10 Essential (primary) hypertension: Secondary | ICD-10-CM | POA: Diagnosis not present

## 2022-06-23 DIAGNOSIS — M25551 Pain in right hip: Secondary | ICD-10-CM | POA: Diagnosis not present

## 2022-06-23 MED ORDER — TAMSULOSIN HCL 0.4 MG PO CAPS: 0.4000 mg | ORAL_CAPSULE | Freq: Every day | ORAL | 3 refills | Status: DC

## 2022-06-23 MED ORDER — NICOTINE 21 MG/24HR TD PT24: 21.0000 mg | MEDICATED_PATCH | Freq: Every day | TRANSDERMAL | 0 refills | Status: DC

## 2022-06-23 MED ORDER — NICOTINE 14 MG/24HR TD PT24: 14.0000 mg | MEDICATED_PATCH | Freq: Every day | TRANSDERMAL | 0 refills | Status: DC

## 2022-06-23 MED ORDER — NICOTINE 7 MG/24HR TD PT24: 7.0000 mg | MEDICATED_PATCH | Freq: Every day | TRANSDERMAL | 1 refills | Status: DC

## 2022-06-23 NOTE — Assessment & Plan Note (Signed)
Blood pressure little on the low side today.  He has been taking Flomax in addition to his lisinopril.  Additional muscle relaxer may be contributing to low blood pressure as well.  Readings at home have been well controlled but not too low.  He denies symptoms of hypotension.  We will plan to continue lisinopril at current strength.

## 2022-06-23 NOTE — Assessment & Plan Note (Signed)
Seems to be musculoskeletal in nature.  Discussed trying to limit muscle relaxer.  Recommend trying heat to area.

## 2022-06-23 NOTE — Assessment & Plan Note (Signed)
He was counseled on smoking cessation.  Adding nicotine patches.

## 2022-06-23 NOTE — Therapy (Signed)
[facility] [place], [place], [address] Phone: [phone]   Fax:  [phone]  Physical Therapy Treatment  Rationale for Evaluation and Treatment Rehabilitation  Patient Details  Name: Erik Estrada MRN: [id number] Date of Birth: [birth year] Referring Provider (PT): Dr Melrose Nakayama   Encounter Date: 06/23/2022   PT End of Session - 06/23/22 0933     Visit Number 14    Number of Visits 16    Date for PT Re-Evaluation 07/14/22    Authorization Type Medicare    Authorization - Visit Number 20    Progress Note Due on Visit 20    PT Start Time 0930    PT Stop Time 0998    PT Time Calculation (min) 45 min    Activity Tolerance Patient tolerated treatment well             Past Medical History:  Diagnosis Date   Anxiety    Arthritis    Essential hypertension, benign 06/29/2011   Hemorrhoids 08/01/2018   History of kidney stones    Morbid obesity ([employer]) 10/12/2018   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  Plan for 5-year recheck.   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   Subjective Assessment - 06/23/22 0934     Subjective No Rt hip pain but has continued pain in the Lt knee and in the waist area of back. May have been sitting too much this week and thinks that is what is causing the back pain. He has continued with exercises in the pool this week.    Currently in Pain? No/denies     Pain Score 0-No pain    Pain Location Hip    Pain Orientation Right    Pain Descriptors / Indicators Tightness    Pain Score 7    Pain Location Knee    Pain Orientation Left    Pain Descriptors / Indicators Aching                [facility] PT Assessment - 06/23/22 0001       Assessment   Medical Diagnosis Rt THA    Referring Provider (PT) Dr Melrose Nakayama    Onset Date/Surgical Date 04/19/22    Hand Dominance Right    Prior Therapy L shoulder; Lt knee and Rt hip      Observation/Other Assessments   Focus on Therapeutic Outcomes (FOTO)  42      Strength   Right Hip Flexion 4+/5    Right Hip Extension 4+/5    Right Hip ABduction 4-/5    Left Hip Flexion 4/5    Left Hip Extension 4/5   in available range   Left Hip ABduction 4-/5    Right Knee Flexion 4+/5    Right Knee Extension 4+/5    Left Knee Flexion 4/5    Left Knee Extension 3+/5      Flexibility   Hamstrings tight  bilat    Quadriceps tight bilat                           [facility] Adult PT Treatment/Exercise - 06/23/22 0001       Ambulation/Gait   Ambulation/Gait Assistance 6: Modified independent (Device/Increase time)    Ambulation Distance (Feet) 120 Feet    Assistive device Straight cane    Gait Pattern Step-through pattern    Ambulation Surface Level    Stairs Yes    Stairs Assistance 7: Independent    Stair Management Technique One rail Right;Step to pattern;Forwards;With cane    Number of Stairs 6      Knee/Hip Exercises: Aerobic   Nustep L7 x 10 min      Knee/Hip Exercises: Standing   Heel Raises 20 reps    Forward Step Up Right;10 reps;Hand Hold: 2;Step Height: 6"    Functional Squat 20 reps    Functional Squat Limitations UE support as needed    SLS Rt LE 15 sec x 3 reps UE support as needed      Knee/Hip Exercises: Seated   Sit to Sand 20 reps;without UE support   10 reps x 2 sets     Knee/Hip Exercises: Supine   Quad Sets Strengthening;Right;2 sets;10 reps     Bridges Strengthening;Both;20 reps    Straight Leg Raises Strengthening;Right;10 reps      Knee/Hip Exercises: Sidelying   Hip ABduction Right;10 reps    Clams 10 reps x 2 sets      Knee/Hip Exercises: Prone   Hip Extension Strengthening;Right;Left;2 sets;10 reps    Hip Extension Limitations through available range/glut set with knee extension to activate posterior chain through lumbar spine, hips, HS    Other Prone Exercises glut set 10 sec x 20 reps    Other Prone Exercises knee flexion ~ 20 reps each side                       PT Short Term Goals - 05/26/22 1106       PT SHORT TERM GOAL #1   Title Pt will be independent with initial HEP    Time 4    Period Weeks    Status Achieved    Target Date 05/19/22      PT SHORT TERM GOAL #2   Title Full Rt knee extension and +/> Rt hip flexion to 90-100 degrees and extension to 0 to 5 deg    Time 4    Period Weeks    Status Partially Met    Target Date 05/19/22      PT SHORT TERM GOAL #3   Title Independent in transfers sit >/<supine    Time 4    Period Weeks    Status Achieved    Target Date 05/19/22               PT Long Term Goals - 06/23/22 0936       PT LONG TERM GOAL #1   Title Pt will be independent with final HEP    Time 4    Period Weeks    Status On-going    Target Date 07/14/22      PT LONG TERM GOAL #2   Title Pt will demo at least 4/5 bilat hip strength for improved single leg stability/gait/transfers    Time 4    Period Weeks    Status On-going    Target  Date 07/14/22      PT LONG TERM GOAL #3   Title Pt will be able to amb at least 400' with least restrictive assistive device with </=2/10 pain    Time 8    Period Weeks    Status Achieved    Target Date 07/14/22      PT LONG TERM GOAL #4   Title Independent in all transfers stand </> sit </> supine    Time 8    Period Weeks    Status Achieved    Target Date 06/16/22      PT LONG TERM GOAL #5   Title Improve functional  limitation score to 44    Time 8    Period Weeks    Status On-going    Target Date 07/14/22                   Plan - 06/23/22 8921     Clinical Impression Statement Erik Estrada continues to improve with Rt hip rehab. He has persistent Lt knee pain and is having some back pain today which he feels is related to sitting in poor positions. Continued working on LE ROM and strengthening. Erik Estrada is progressing well toward stated goals of therapy, accomplishing several of his long term goals. Lt knee pain limits Rt hip rehab but Erik Estrada is reaching independent level for home exercise program. Anticipate d/c at next visit.    Rehab Potential Good    PT Frequency 1x / week    PT Duration 4 weeks    PT Treatment/Interventions ADLs/Self Care Home Management;Aquatic Therapy;Cryotherapy;Electrical Stimulation;Iontophoresis 96m/ml Dexamethasone;Moist Heat;DME Instruction;Gait training;Stair training;Functional mobility training;Therapeutic activities;Therapeutic exercise;Balance training;Neuromuscular re-education;Manual techniques;Patient/family education;Passive range of motion;Dry needling;Taping    PT Next Visit Plan progress HEP as tolerated. continue standing ther ex, gait training - anticipate d/c at next visit    PT [place] Agree with Plan of Care Patient             Patient will benefit from skilled therapeutic intervention in order to improve the following deficits and impairments:     Visit Diagnosis: Pain in right hip  Abnormal gait  Muscle weakness (generalized)  Stiffness of hip joint, right  Other symptoms and signs involving the musculoskeletal system     Problem List Patient Active Problem List   Diagnosis Date Noted   Allergic sinusitis 04/12/2022   Rotator cuff tendinitis, right 02/03/2022   Primary osteoarthritis of right hip 06/24/2021   Primary osteoarthritis of left knee 03/29/2021   Hamstring strain, left, initial encounter  02/26/2021   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 01/29/2021   OA (osteoarthritis) of knee 08/16/2020   Neoplasm of uncertain behavior of skin 03/12/2020   Tubular adenoma of colon 10/17/2018   Hyperlipidemia 10/12/2018   Greater trochanteric bursitis of both hips 10/12/2018   Morbid obesity ([place] 10/12/2018   Vitamin D deficiency 08/01/2017   Neurotic excoriations 07/27/2016   Family history of colon cancer requiring screening colonoscopy 05/16/2012   Prediabetes 05/16/2012   Tobacco abuse 07/20/2011   Hypogonadism male 07/20/2011   Essential hypertension, benign 06/29/2011    Duriel Deery PNilda Simmer PT, MPH  06/23/2022, 10:05 AM  [facility] [address]Phone: [phone]  Fax:  [phone] Name: THarold MoncusMRN: [id number] of Birth: 111/03/1951

## 2022-06-23 NOTE — Progress Notes (Signed)
Erik Estrada - 71 y.o. male MRN [id number]  Date of birth: 07-19-1951  Subjective No chief complaint on file.   HPI Erik Estrada is a 71 y.o. male here today for follow up visit.  Recently had total hip arthroplasty.  Doing well post-operatively.  Continues with PT treatment.    Remains on lisinopril for management of HTN.  He denies side effects related to medication.  BP is actually low today.  He denies symptoms related to hypotension. He has not had chest pain, shortness of breath, palpitations ,headache or vision changes.  Unfortunately he does continue to smoke.  He would like to try to reduce and/or quit prior to having knee replacement in the future.  He did not tolerate Chantix or bupropion previously.  He is having some back pain.  Pain is located in the mid back.  Nonradicular.  Improved with muscle relaxer.  He did have some difficulty with completely emptying his bladder after his previous surgery.  His wife had some Flomax leftover which seems to be helping quite a bit.  I guess I mean if we ever needed ship it back for warrantee for the weekend there is a device OAS but sorry  ROS:  A comprehensive ROS was completed and negative except as noted per HPI     Allergies  Allergen Reactions   Hydrochlorothiazide Other (See Comments)    Fatigue   Lipitor [Atorvastatin Calcium] Other (See Comments)    Myalgia    Past Medical History:  Diagnosis Date   Anxiety    Arthritis    Essential hypertension, benign 06/29/2011   Hemorrhoids 08/01/2018   History of kidney stones    Morbid obesity ([place]) 10/12/2018   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  Plan for 5-year recheck.   Vitamin D deficiency 08/01/2017    Past Surgical History:  Procedure Laterality Date   KIDNEY STONE SURGERY     SHOULDER SURGERY  December 2010   Left    TOTAL HIP ARTHROPLASTY Right 04/19/2022   Procedure: RIGHT TOTAL HIP ARTHROPLASTY ANTERIOR APPROACH;  Surgeon: Melrose Nakayama, MD;  Location: [facility];  Service: Orthopedics;  Laterality: Right;   UMBILICAL HERNIA REPAIR      Social History   Socioeconomic History   Marital status: Married    Spouse name: Caren Griffins   Number of children: 2   Years of education: Not on file   Highest education level: Not on file  Occupational History   Occupation: Public librarian    Comment: [employer]  Tobacco Use   Smoking status: Every Day    Packs/day: 1.50    Years: 22.00    Total pack years: 33.00    Types: Cigarettes   Smokeless tobacco: Never  Vaping Use   Vaping Use: Never used  Substance and Sexual Activity   Alcohol use: No   Drug use: No   Sexual activity: Yes    Partners: Female  Other Topics Concern   Not on file  Social History Narrative   2 caffeinated drinks daily. No regular exercise he does have a physical job.   Social Determinants of Health   Financial Resource Strain: Not on file  Food Insecurity: Not on file  Transportation Needs: Not on file  Physical Activity: Not on file  Stress: Not on file  Social Connections: Not on file    Family History  Problem Relation Age of Onset   Colon cancer Brother    Hypertension Mother    Parkinson's disease Mother    Stroke Brother     Health Maintenance  Topic Date Due   COVID-19 Vaccine (3 - Pfizer risk series) 08/12/2022 (Originally 03/24/2020)   Pneumonia Vaccine 27+ Years old (50 - PPSV23 or PCV20) 09/23/2022 (Originally 08/01/2022)   Zoster Vaccines- Shingrix (1 of 2) 09/23/2022 (Originally 10/22/1972)   COLONOSCOPY (Pts 45-73yr Insurance coverage will need to be confirmed)  03/11/2023 (Originally 11/29/2018)   INFLUENZA VACCINE  07/12/2022   TETANUS/TDAP  11/15/2022   Hepatitis C Screening  Completed   HPV VACCINES  Aged Out      ----------------------------------------------------------------------------------------------------------------------------------------------------------------------------------------------------------------- Physical Exam BP 99/65 (BP Location: Right Arm)   Pulse 90   Ht '5\' 4"'$  (1.626 m)   Wt 217 lb (98.4 kg)   SpO2 98%   BMI 37.25 kg/m   Physical Exam Constitutional:      Appearance: Normal appearance.  Eyes:     General: No scleral icterus. Cardiovascular:     Rate and Rhythm: Normal rate and regular rhythm.  Pulmonary:     Effort: Pulmonary effort is normal.     Breath sounds: Normal breath sounds.  Musculoskeletal:     Cervical back: Neck supple.  Neurological:     Mental Status: He is alert.  Psychiatric:        Mood and Affect: Mood normal.        Behavior: Behavior normal.     ------------------------------------------------------------------------------------------------------------------------------------------------------------------------------------------------------------------- Assessment and Plan  Essential hypertension, benign Blood pressure little on the low side today.  He has been taking Flomax in addition to his lisinopril.  Additional muscle relaxer may be contributing to low blood pressure as well.  Readings at home have been well controlled but not too low.  He denies symptoms of hypotension.  We will plan to continue lisinopril at current strength.  Tobacco abuse He was counseled on smoking cessation.  Adding nicotine patches.  Back pain Seems to be musculoskeletal in nature.  Discussed trying to limit muscle relaxer.  Recommend trying heat to area.  Urinary retention Improved with Flomax.  We will plan to continue this at current strength.   Meds ordered this encounter  Medications   tamsulosin (FLOMAX) 0.4 MG CAPS capsule    Sig: Take 1 capsule (0.4 mg total) by mouth daily.    Dispense:  30 capsule    Refill:  3   nicotine  (NICODERM CQ) 21 mg/24hr patch    Sig: Place 1 patch (21 mg total) onto the skin daily. Use for 14 days then decrease to '14mg'$ .    Dispense:  14 patch    Refill:  0   nicotine (NICODERM CQ) 14 mg/24hr patch    Sig: Place 1 patch (14 mg total) onto the skin daily. Decrease to '7mg'$  after 28 days.    Dispense:  28 patch    Refill:  0   nicotine (NICODERM CQ) 7 mg/24hr patch    Sig: Place 1 patch (7 mg total) onto the skin daily.    Dispense:  28 patch    Refill:  1    Return in about 6 months (around 12/24/2022) for HTN.    This visit occurred during the SARS-CoV-2 public health emergency.  Safety protocols were in place, including screening questions prior to the visit, additional usage of staff PPE, and extensive cleaning of exam room while observing appropriate contact time as indicated for disinfecting solutions.

## 2022-06-23 NOTE — Assessment & Plan Note (Signed)
Improved with Flomax.  We will plan to continue this at current strength.

## 2022-06-30 ENCOUNTER — Encounter: Payer: Self-pay | Admitting: Rehabilitative and Restorative Service Providers"

## 2022-06-30 ENCOUNTER — Ambulatory Visit: Payer: Medicare Other | Admitting: Rehabilitative and Restorative Service Providers"

## 2022-06-30 DIAGNOSIS — M25651 Stiffness of right hip, not elsewhere classified: Secondary | ICD-10-CM

## 2022-06-30 DIAGNOSIS — M6281 Muscle weakness (generalized): Secondary | ICD-10-CM | POA: Diagnosis not present

## 2022-06-30 DIAGNOSIS — M25551 Pain in right hip: Secondary | ICD-10-CM

## 2022-06-30 DIAGNOSIS — R293 Abnormal posture: Secondary | ICD-10-CM | POA: Diagnosis not present

## 2022-06-30 DIAGNOSIS — R269 Unspecified abnormalities of gait and mobility: Secondary | ICD-10-CM

## 2022-06-30 DIAGNOSIS — R29898 Other symptoms and signs involving the musculoskeletal system: Secondary | ICD-10-CM | POA: Diagnosis not present

## 2022-06-30 DIAGNOSIS — R600 Localized edema: Secondary | ICD-10-CM

## 2022-06-30 NOTE — Therapy (Signed)
[place], [place], [address] Phone: [phone]   Fax:  [phone]  Physical Therapy Treatment Rationale for Evaluation and Treatment Rehabilitation  PHYSICAL THERAPY DISCHARGE SUMMARY  Visits from Start of Care: 15  Current functional level related to goals / functional outcomes: See progress note for discharge status    Remaining deficits: No known deficits Rt hip; continued pain in the Lt knee limiting functional activities    Education / Equipment: HEP    Patient agrees to discharge. Patient goals were met. Patient is being discharged due to meeting the stated rehab goals.   Patient Details  Name: Erik Estrada MRN: [id number] Date of Birth: November 22, 1951 Referring Provider (PT): Dr Melrose Nakayama   Encounter Date: 06/30/2022   PT End of Session - 06/30/22 1059     Visit Number 15    Number of Visits 16    Date for PT Re-Evaluation 07/14/22    Authorization Type Medicare    Authorization - Visit Number 20    Progress Note Due on Visit 20    PT Start Time 2536    PT Stop Time 6440    PT Time Calculation (min) 44 min    Activity Tolerance Patient tolerated treatment well             Past Medical History:  Diagnosis Date   Anxiety    Arthritis    Essential hypertension, benign 06/29/2011   Hemorrhoids 08/01/2018   History of kidney stones    Morbid obesity ([place]) 10/12/2018   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  Plan for 5-year recheck.   Vitamin D deficiency 08/01/2017    Past Surgical History:  Procedure Laterality Date   KIDNEY STONE SURGERY     SHOULDER SURGERY  December 2010   Left   TOTAL HIP ARTHROPLASTY Right 04/19/2022   Procedure: RIGHT TOTAL HIP ARTHROPLASTY ANTERIOR APPROACH;  Surgeon: Melrose Nakayama, MD;  Location:  [facility];  Service: Orthopedics;  Laterality: Right;   UMBILICAL HERNIA REPAIR      There were no vitals filed for this visit.   Subjective Assessment - 06/30/22 1100     Subjective [name] reports no Rt hip pain but he continues to have Lt knee pain. He is using the brace for the Lt knee which helps some. [name] is going to exercise in the pool several times a week which helps. Patient is independent in HEP and will continue with home exercises.    Currently in Pain? No/denies    Pain Location Hip    Pain Orientation Right    Pain Descriptors / Indicators Tightness                [facility] PT Assessment - 06/30/22 0001       Assessment   Medical Diagnosis Rt THA    Referring Provider (PT) Dr Melrose Nakayama    Onset Date/Surgical Date 04/19/22    Hand Dominance Right    Prior Therapy L shoulder; Lt knee and Rt hip      Observation/Other Assessments   Focus on Therapeutic Outcomes (FOTO)  63      AROM   Right Knee Extension -5    Right Knee Flexion 110    Left Knee Extension -12    Left Knee Flexion 110      Strength   Right  Hip Flexion 4+/5    Right Hip Extension 4+/5    Right Hip ABduction 4-/5    Left Hip Flexion 4/5    Left Hip Extension 4/5   in available range   Left Hip ABduction 4-/5    Right Knee Flexion 4+/5    Right Knee Extension 4+/5    Left Knee Flexion 4/5    Left Knee Extension 3+/5      Flexibility   Hamstrings tight bilat    Quadriceps tight bilat      Ambulation/Gait   Ambulation/Gait Assistance 6: Modified independent (Device/Increase time)    Ambulation Distance (Feet) 120 Feet    Assistive device Straight cane    Gait Pattern Step-through pattern    Ambulation Surface Level    Stairs Yes    Stairs Assistance 7: Independent    Stair Management Technique One rail Right;Step to pattern;Forwards;With cane    Number of Stairs 6                           [facility] Adult PT Treatment/Exercise - 06/30/22 0001       Knee/Hip  Exercises: Aerobic   Nustep L7 x 11 min      Knee/Hip Exercises: Standing   Heel Raises 20 reps    Forward Step Up Right;10 reps;Hand Hold: 2;Step Height: 6"    Functional Squat 20 reps    Functional Squat Limitations UE support as needed    SLS Rt LE 15 sec x 3 reps UE support as needed      Knee/Hip Exercises: Seated   Sit to Sand 2 sets;20 reps;without UE support   10 reps x 2 sets     Knee/Hip Exercises: Supine   Bridges Strengthening;Both;3 sets;10 reps    Straight Leg Raises Strengthening;Right;Left;10 reps;2 sets    Other Supine Knee/Hip Exercises hip abduction/clam blue TB alternating LE's 3 sec hold x 20 reps      Knee/Hip Exercises: Sidelying   Hip ABduction Strengthening;3 sets;10 reps    Clams 10 reps x 2 sets      Knee/Hip Exercises: Prone   Hip Extension Strengthening;Right;Left;2 sets;10 reps    Hip Extension Limitations through available range/glut set with knee extension to activate posterior chain through lumbar spine, hips, HS    Other Prone Exercises glut set 10 sec x 20 reps    Other Prone Exercises knee flexion ~ 20 reps each side                       PT Short Term Goals - 05/26/22 1106       PT SHORT TERM GOAL #1   Title Pt will be independent with initial HEP    Time 4    Period Weeks    Status Achieved    Target Date 05/19/22      PT SHORT TERM GOAL #2   Title Full Rt knee extension and +/> Rt hip flexion to 90-100 degrees and extension to 0 to 5 deg    Time 4    Period Weeks    Status Partially Met    Target Date 05/19/22      PT SHORT TERM GOAL #3   Title Independent in transfers sit >/<supine    Time 4    Period Weeks    Status Achieved    Target Date 05/19/22  PT Long Term Goals - 06/30/22 1103       PT LONG TERM GOAL #1   Title Pt will be independent with final HEP    Time 4    Period Weeks    Status Achieved    Target Date 07/14/22      PT LONG TERM GOAL #2   Title Pt will demo at least  4/5 bilat hip strength for improved single leg stability/gait/transfers    Time 4    Period Weeks    Status Achieved    Target Date 07/14/22      PT LONG TERM GOAL #3   Title Pt will be able to amb at least 400' with least restrictive assistive device with </=2/10 pain    Time 8    Period Weeks    Status Achieved    Target Date 07/14/22      PT LONG TERM GOAL #4   Title Independent in all transfers stand </> sit </> supine    Time 8    Period Weeks    Status Achieved    Target Date 06/16/22      PT LONG TERM GOAL #5   Title Improve functional limitation score to 44    Time 8    Period Weeks    Status Achieved                   Plan - 06/30/22 1110     Clinical Impression Statement [name] continues to demonstrate improvement in Rt hip ROM and mobility as well as strength. he is ambulating without assistive device or with SPC at times when outdoors - due to Lt knee pain. [name] is working on his HEP and also going to the pool to exercises several times per week. Goals of therpay have been accomplished and patient will continue with independent home program. He will call with any questions or problems.    Rehab Potential Good    PT Frequency 1x / week    PT Duration 4 weeks    PT Treatment/Interventions ADLs/Self Care Home Management;Aquatic Therapy;Cryotherapy;Electrical Stimulation;Iontophoresis 27m/ml Dexamethasone;Moist Heat;DME Instruction;Gait training;Stair training;Functional mobility training;Therapeutic activities;Therapeutic exercise;Balance training;Neuromuscular re-education;Manual techniques;Patient/family education;Passive range of motion;Dry needling;Taping    PT Next Visit Plan d/c to independent home program    [facility]   Consulted and Agree with Plan of Care Patient    Family Member Consulted CJenny Reichmann- wife             Patient will benefit from skilled therapeutic intervention in order to improve the following deficits and  impairments:     Visit Diagnosis: Pain in right hip  Abnormal gait  Muscle weakness (generalized)  Stiffness of hip joint, right  Other symptoms and signs involving the musculoskeletal system  Abnormal posture  Localized edema     Problem List Patient Active Problem List   Diagnosis Date Noted   Back pain 06/23/2022   Urinary retention 06/23/2022   Allergic sinusitis 04/12/2022   Rotator cuff tendinitis, right 02/03/2022   Primary osteoarthritis of right hip 06/24/2021   Primary osteoarthritis of left knee 03/29/2021   Hamstring strain, left, initial encounter 02/26/2021   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 01/29/2021   OA (osteoarthritis) of knee 08/16/2020   Neoplasm of uncertain behavior of skin 03/12/2020   Tubular adenoma of colon 10/17/2018   Hyperlipidemia 10/12/2018   Greater trochanteric bursitis of both hips 10/12/2018   Morbid  obesity ([place]) 10/12/2018   Vitamin D deficiency 08/01/2017   Neurotic excoriations 07/27/2016   Family history of colon cancer requiring screening colonoscopy 05/16/2012   Prediabetes 05/16/2012   Tobacco abuse 07/20/2011   Hypogonadism male 07/20/2011   Essential hypertension, benign 06/29/2011    Jaleiyah Alas Nilda Simmer, PT, MPH  06/30/2022, 11:39 AM  [facility] [place], [place], [address] Phone: [phone]   Fax:  [phone]  Name: Koston Hennes MRN: [id number] Date of Birth: September 09, 1951

## 2022-07-18 DIAGNOSIS — M25562 Pain in left knee: Secondary | ICD-10-CM | POA: Diagnosis not present

## 2022-07-18 DIAGNOSIS — M1712 Unilateral primary osteoarthritis, left knee: Secondary | ICD-10-CM | POA: Diagnosis not present

## 2022-07-18 DIAGNOSIS — Z96641 Presence of right artificial hip joint: Secondary | ICD-10-CM | POA: Diagnosis not present

## 2022-08-03 ENCOUNTER — Encounter: Payer: Self-pay | Admitting: General Practice

## 2022-08-09 ENCOUNTER — Ambulatory Visit (INDEPENDENT_AMBULATORY_CARE_PROVIDER_SITE_OTHER): Payer: Medicare Other

## 2022-08-09 ENCOUNTER — Ambulatory Visit (INDEPENDENT_AMBULATORY_CARE_PROVIDER_SITE_OTHER): Payer: Medicare Other | Admitting: Sports Medicine

## 2022-08-09 DIAGNOSIS — R509 Fever, unspecified: Secondary | ICD-10-CM | POA: Diagnosis not present

## 2022-08-09 DIAGNOSIS — U071 COVID-19: Secondary | ICD-10-CM

## 2022-08-09 DIAGNOSIS — R059 Cough, unspecified: Secondary | ICD-10-CM | POA: Diagnosis not present

## 2022-08-09 MED ORDER — NIRMATRELVIR/RITONAVIR (PAXLOVID)TABLET: ORAL_TABLET | ORAL | 0 refills | Status: DC

## 2022-08-09 NOTE — Addendum Note (Signed)
Addended by: Silverio Decamp on: 08/09/2022 11:12 AM   Modules accepted: Orders

## 2022-08-09 NOTE — Assessment & Plan Note (Addendum)
Pleasant 70 year old male, for the past 2 days has had fevers well over 100 F, muscle aches, body aches, runny nose, facial pain and pressure. Mild cough. On exam he appears uncomfortable, boggy erythematous nasal turbinates with significant nasal purulence, ear exam is unremarkable, no tenderness over the frontal or maxillary sinuses. Slight coarse sounds right middle and lower lobe.  Rapid flu test negative, COVID test positive, adding Paxlovid, chest x-ray. He can do over-the-counter cold and flu medications. Return to see [place] as needed. I have advised his wife to discuss her exposure with her own PCP.

## 2022-08-09 NOTE — Progress Notes (Signed)
    Procedures performed today:    None.  Independent interpretation of notes and tests performed by another provider:   None.  Brief History, Exam, Impression, and Recommendations:    COVID-19 Pleasant 71 year old male, for the past 2 days has had fevers well over 100 F, muscle aches, body aches, runny nose, facial pain and pressure. Mild cough. On exam he appears uncomfortable, boggy erythematous nasal turbinates with significant nasal purulence, ear exam is unremarkable, no tenderness over the frontal or maxillary sinuses. Slight coarse sounds right middle and lower lobe.  Rapid flu test negative, COVID test positive, adding Paxlovid, chest x-ray. He can do over-the-counter cold and flu medications. Return to see [place] as needed. I have advised his wife to discuss her exposure with her own PCP.    ____________________________________________ Gwen Her. Dianah Field, M.D., ABFM., CAQSM., AME. Primary Care and Sports Medicine [facility]  Adjunct Professor of [place] [employer] [facility] [employer] [facility] of Medicine  Risk manager

## 2022-08-17 ENCOUNTER — Other Ambulatory Visit: Payer: Self-pay | Admitting: Orthopaedic Surgery

## 2022-08-23 ENCOUNTER — Ambulatory Visit (INDEPENDENT_AMBULATORY_CARE_PROVIDER_SITE_OTHER): Payer: Medicare Other | Admitting: Sports Medicine

## 2022-08-23 DIAGNOSIS — U071 COVID-19: Secondary | ICD-10-CM

## 2022-08-23 MED ORDER — PREDNISONE 50 MG PO TABS: 50.0000 mg | ORAL_TABLET | Freq: Every day | ORAL | 0 refills | Status: DC

## 2022-08-23 NOTE — Progress Notes (Signed)
    Procedures performed today:    None.  Independent interpretation of notes and tests performed by another provider:   None.  Brief History, Exam, Impression, and Recommendations:    COVID-19 Erik Estrada returns, he is a pleasant 71 year old male with multiple comorbidities, I last saw him about 2 weeks ago, he had fevers over 100 muscle aches, body aches, facial pain and pressure, we tested him for flu and COVID, COVID was positive so we added Paxlovid, chest x-ray clear. Feels a lot better today, speaking full sentences, exam benign, does have some continued sinus pressure so we will add a course of steroids, return to see me as needed.    ____________________________________________ Gwen Her. Dianah Field, M.D., ABFM., CAQSM., AME. Primary Care and Sports Medicine [facility]  Adjunct Professor of [place] [employer] [facility] of Medicine  Risk manager

## 2022-08-23 NOTE — Assessment & Plan Note (Signed)
Erik Estrada returns, he is a pleasant 71 year old male with multiple comorbidities, I last saw him about 2 weeks ago, he had fevers over 100 muscle aches, body aches, facial pain and pressure, we tested him for flu and COVID, COVID was positive so we added Paxlovid, chest x-ray clear. Feels a lot better today, speaking full sentences, exam benign, does have some continued sinus pressure so we will add a course of steroids, return to see me as needed.

## 2022-09-09 ENCOUNTER — Ambulatory Visit: Payer: Medicare Other | Admitting: Family Medicine

## 2022-09-13 NOTE — Patient Instructions (Signed)
DUE TO COVID-19 ONLY TWO VISITORS  (aged 69 and older)  ARE ALLOWED TO COME WITH YOU AND STAY IN THE WAITING ROOM ONLY DURING PRE OP AND PROCEDURE.   **NO VISITORS ARE ALLOWED IN THE SHORT STAY AREA OR RECOVERY ROOM!!**  IF YOU WILL BE ADMITTED INTO THE HOSPITAL YOU ARE ALLOWED ONLY FOUR SUPPORT PEOPLE DURING VISITATION HOURS ONLY (7 AM -8PM)   The support person(s) must pass our screening, gel in and out, and wear a mask at all times, including in the patient's room. Patients must also wear a mask when staff or their support person are in the room. Visitors GUEST BADGE MUST BE WORN VISIBLY  One adult visitor may remain with you overnight and MUST be in the room by 8 P.M.     Your procedure is scheduled on: 09/20/22   Report to [facility] Main Entrance    Report to admitting at : 10:00 AM   Call this number if you have problems the morning of surgery [phone]   Do not eat food :After Midnight.   After Midnight you may have the following liquids until : 9:30 AM DAY OF SURGERY  Water Black Coffee (sugar ok, NO MILK/CREAM OR CREAMERS)  Tea (sugar ok, NO MILK/CREAM OR CREAMERS) regular and decaf                             Plain Jell-O (NO RED)                                           Fruit ices (not with fruit pulp, NO RED)                                     Popsicles (NO RED)                                                                  Juice: apple, WHITE grape, WHITE cranberry Sports drinks like Gatorade (NO RED)              Drink G2 drink AT:  9:30 AM the day of surgery.     The day of surgery:  Drink ONE (1) Pre-Surgery Clear Ensure or G2 at AM the morning of surgery. Drink in one sitting. Do not sip.  This drink was given to you during your hospital  pre-op appointment visit. Nothing else to drink after completing the  Pre-Surgery Clear Ensure or G2.          If you have questions, please contact your surgeon's office.   Oral Hygiene is also important  to reduce your risk of infection.                                    Remember - BRUSH YOUR TEETH THE MORNING OF SURGERY WITH YOUR REGULAR TOOTHPASTE   Do NOT smoke after Midnight   Take these medicines the morning of surgery with A SIP  OF WATER: tamsulosin.Tylenol as needed.  DO NOT TAKE ANY ORAL DIABETIC MEDICATIONS DAY OF YOUR SURGERY                              You may not have any metal on your body including hair pins, jewelry, and body piercing             Do not wear make-up, lotions, powders, perfumes/cologne, or deodorant  Do not wear nail polish including gel and S&S, artificial/acrylic nails, or any other type of covering on natural nails including finger and toenails. If you have artificial nails, gel coating, etc. that needs to be removed by a nail salon please have this removed prior to surgery or surgery may need to be canceled/ delayed if the surgeon/ anesthesia feels like they are unable to be safely monitored.   Do not shave  48 hours prior to surgery.               Men may shave face and neck.   Do not bring valuables to the hospital. [place].   Contacts, dentures or bridgework may not be worn into surgery.   Bring small overnight bag day of surgery.   DO NOT [place]. PHARMACY WILL DISPENSE MEDICATIONS LISTED ON YOUR MEDICATION LIST TO YOU DURING YOUR ADMISSION [place]!    Patients discharged on the day of surgery will not be allowed to drive home.  Someone NEEDS to stay with you for the first 24 hours after anesthesia.   Special Instructions: Bring a copy of your healthcare power of attorney and living will documents         the day of surgery if you haven't scanned them before.              Please read over the following fact sheets you were given: IF YOU HAVE QUESTIONS ABOUT YOUR PRE-OP INSTRUCTIONS PLEASE CALL [phone]     [facility] - Preparing for  Surgery Before surgery, you can play an important role.  Because skin is not sterile, your skin needs to be as free of germs as possible.  You can reduce the number of germs on your skin by washing with CHG (chlorahexidine gluconate) soap before surgery.  CHG is an antiseptic cleaner which kills germs and bonds with the skin to continue killing germs even after washing. Please DO NOT use if you have an allergy to CHG or antibacterial soaps.  If your skin becomes reddened/irritated stop using the CHG and inform your nurse when you arrive at Short Stay. Do not shave (including legs and underarms) for at least 48 hours prior to the first CHG shower.  You may shave your face/neck. Please follow these instructions carefully:  1.  Shower with CHG Soap the night before surgery and the  morning of Surgery.  2.  If you choose to wash your hair, wash your hair first as usual with your  normal  shampoo.  3.  After you shampoo, rinse your hair and body thoroughly to remove the  shampoo.                           4.  Use CHG as you would any other liquid soap.  You can apply  chg directly  to the skin and wash                       Gently with a scrungie or clean washcloth.  5.  Apply the CHG Soap to your body ONLY FROM THE NECK DOWN.   Do not use on face/ open                           Wound or open sores. Avoid contact with eyes, ears mouth and genitals (private parts).                       Wash face,  Genitals (private parts) with your normal soap.             6.  Wash thoroughly, paying special attention to the area where your surgery  will be performed.  7.  Thoroughly rinse your body with warm water from the neck down.  8.  DO NOT shower/wash with your normal soap after using and rinsing off  the CHG Soap.                9.  Pat yourself dry with a clean towel.            10.  Wear clean pajamas.            11.  Place clean sheets on your bed the night of your first shower and do not  sleep with pets. Day  of Surgery : Do not apply any lotions/deodorants the morning of surgery.  Please wear clean clothes to the hospital/surgery center.  FAILURE TO FOLLOW THESE INSTRUCTIONS MAY RESULT IN THE CANCELLATION OF YOUR SURGERY PATIENT SIGNATURE_________________________________  NURSE SIGNATURE__________________________________  ________________________________________________________________________   Erik Estrada  An incentive spirometer is a tool that can help keep your lungs clear and active. This tool measures how well you are filling your lungs with each breath. Taking long deep breaths may help reverse or decrease the chance of developing breathing (pulmonary) problems (especially infection) following: A long period of time when you are unable to move or be active. BEFORE THE PROCEDURE  If the spirometer includes an indicator to show your best effort, your nurse or respiratory therapist will set it to a desired goal. If possible, sit up straight or lean slightly forward. Try not to slouch. Hold the incentive spirometer in an upright position. INSTRUCTIONS FOR USE  Sit on the edge of your bed if possible, or sit up as far as you can in bed or on a chair. Hold the incentive spirometer in an upright position. Breathe out normally. Place the mouthpiece in your mouth and seal your lips tightly around it. Breathe in slowly and as deeply as possible, raising the piston or the ball toward the top of the column. Hold your breath for 3-5 seconds or for as long as possible. Allow the piston or ball to fall to the bottom of the column. Remove the mouthpiece from your mouth and breathe out normally. Rest for a few seconds and repeat Steps 1 through 7 at least 10 times every 1-2 hours when you are awake. Take your time and take a few normal breaths between deep breaths. The spirometer may include an indicator to show your best effort. Use the indicator as a goal to work toward during each  repetition. After each set of 10 deep breaths, practice coughing to be sure  your lungs are clear. If you have an incision (the cut made at the time of surgery), support your incision when coughing by placing a pillow or rolled up towels firmly against it. Once you are able to get out of bed, walk around indoors and cough well. You may stop using the incentive spirometer when instructed by your caregiver.  RISKS AND COMPLICATIONS Take your time so you do not get dizzy or light-headed. If you are in pain, you may need to take or ask for pain medication before doing incentive spirometry. It is harder to take a deep breath if you are having pain. AFTER USE Rest and breathe slowly and easily. It can be helpful to keep track of a log of your progress. Your caregiver can provide you with a simple table to help with this. If you are using the spirometer at home, follow these instructions: [place] IF:  You are having difficultly using the spirometer. You have trouble using the spirometer as often as instructed. Your pain medication is not giving enough relief while using the spirometer. You develop fever of 100.5 F (38.1 C) or higher. SEEK IMMEDIATE MEDICAL CARE IF:  You cough up bloody sputum that had not been present before. You develop fever of 102 F (38.9 C) or greater. You develop worsening pain at or near the incision site. MAKE SURE YOU:  Understand these instructions. Will watch your condition. Will get help right away if you are not doing well or get worse. Document Released: 04/10/2007 Document Revised: 02/20/2012 Document Reviewed: 06/11/2007 [facility] Patient Information 2014 [place], [place].   ________________________________________________________________________

## 2022-09-14 ENCOUNTER — Other Ambulatory Visit: Payer: Self-pay

## 2022-09-14 ENCOUNTER — Encounter (HOSPITAL_COMMUNITY): Payer: Self-pay

## 2022-09-14 ENCOUNTER — Encounter (HOSPITAL_COMMUNITY)
Admission: RE | Admit: 2022-09-14 | Discharge: 2022-09-14 | Disposition: A | Discharge status: Home / Self Care | Payer: Medicare Other | Source: Ambulatory Visit | Attending: Orthopaedic Surgery | Admitting: Orthopaedic Surgery

## 2022-09-14 VITALS — BP 123/70 | HR 85 | Temp 98.4°F | Ht 64.0 in | Wt 210.0 lb

## 2022-09-14 DIAGNOSIS — Z01812 Encounter for preprocedural laboratory examination: Secondary | ICD-10-CM | POA: Diagnosis not present

## 2022-09-14 DIAGNOSIS — Z01818 Encounter for other preprocedural examination: Secondary | ICD-10-CM

## 2022-09-14 DIAGNOSIS — I10 Essential (primary) hypertension: Secondary | ICD-10-CM | POA: Diagnosis not present

## 2022-09-14 DIAGNOSIS — R7303 Prediabetes: Secondary | ICD-10-CM | POA: Insufficient documentation

## 2022-09-14 LAB — BASIC METABOLIC PANEL
Anion gap: 7 (ref 5–15)
BUN: 10 mg/dL (ref 8–23)
CO2: 24 mmol/L (ref 22–32)
Calcium: 9.3 mg/dL (ref 8.9–10.3)
Chloride: 106 mmol/L (ref 98–111)
Creatinine, Ser: 0.97 mg/dL (ref 0.61–1.24)
GFR, Estimated: >60 mL/min (ref >60)
Glucose, Bld: 97 mg/dL (ref 70–99)
Potassium: 4.5 mmol/L (ref 3.5–5.1)
Sodium: 137 mmol/L (ref 135–145)

## 2022-09-14 LAB — HEMOGLOBIN A1C
Hgb A1c MFr Bld: 6.3 % — ABNORMAL HIGH (ref 4.8–5.6)
Mean Plasma Glucose: 134.11 mg/dL

## 2022-09-14 LAB — SURGICAL PCR SCREEN
MRSA, PCR: NEGATIVE
Staphylococcus aureus: POSITIVE — AB

## 2022-09-14 LAB — CBC
HCT: 45.6 % (ref 39.0–52.0)
Hemoglobin: 14.9 g/dL (ref 13.0–17.0)
MCH: 30.6 pg (ref 26.0–34.0)
MCHC: 32.7 g/dL (ref 30.0–36.0)
MCV: 93.6 fL (ref 80.0–100.0)
Platelets: 290 10*3/uL (ref 150–400)
RBC: 4.87 MIL/uL (ref 4.22–5.81)
RDW: 13.9 % (ref 11.5–15.5)
WBC: 8.2 10*3/uL (ref 4.0–10.5)
nRBC: 0 % (ref 0.0–0.2)

## 2022-09-14 LAB — GLUCOSE, CAPILLARY: Glucose-Capillary: 97 mg/dL (ref 70–99)

## 2022-09-14 NOTE — Progress Notes (Addendum)
For Short Stay: [place] appointment date: Date of COVID positive in last 97 days:  Bowel Prep reminder:   For Anesthesia: PCP - DO: Weston Settle. Cardiologist -   Chest x-ray - 08/09/22 EKG - 04/08/22 Stress Test -  ECHO -  Cardiac Cath -  Pacemaker/ICD device last checked: Pacemaker orders received: Device Rep notified:  Spinal Cord Stimulator:  Sleep Study -  CPAP -   Fasting Blood Sugar - N/A Checks Blood Sugar _____ times a day Date and result of last Hgb A1c-  Blood Thinner Instructions: Aspirin Instructions: It's been on hold since last week. Last Dose:  Activity level: Can go up a flight of stairs and activities of daily living without stopping and without chest pain and/or shortness of breath   Able to exercise without chest pain and/or shortness of breath   Unable to go up a flight of stairs without chest pain and/or shortness of breath     Anesthesia review: Hx: HTN,Pre-DIA,Smoker.  Patient denies shortness of breath, fever, cough and chest pain at PAT appointment   Patient verbalized understanding of instructions that were given to them at the PAT appointment. Patient was also instructed that they will need to review over the PAT instructions again at home before surgery.

## 2022-09-15 NOTE — Progress Notes (Signed)
PCR: + STAPH °

## 2022-09-15 NOTE — Care Plan (Signed)
Ortho Bundle Case Management Note  Patient Details  Name: Erik Estrada MRN: [id number] Date of Birth: August 21, 1951     spoke with patient's wife. he will discharge to home with her assistance. has rolling walker at home. OPPT set up with [facility]. discharge instructions mailed to paitent and discussed with wife. states no questions today. appointments confirmed. Patient and MD in agreement with plan. Choice offered                DME Arranged:    DME Agency:     HH Arranged:    HH Agency:     Additional Comments: Please contact me with any questions of if this plan should need to change.  Ladell Heads,  [place] [facility] Specialist  [phone] 09/15/2022, 11:27 AM

## 2022-09-19 MED ORDER — TRANEXAMIC ACID 1000 MG/10ML IV SOLN
2000.0000 mg | INTRAVENOUS | Status: DC
  Filled 2022-09-19: qty 20

## 2022-09-19 NOTE — H&P (Signed)
TOTAL KNEE ADMISSION H&P  Patient is being admitted for left total knee arthroplasty.  Subjective:  Chief Complaint:left knee pain.  HPI: Erik Estrada, 71 y.o. male, has a history of pain and functional disability in the left knee due to arthritis and has failed non-surgical conservative treatments for greater than 12 weeks to includeNSAID's and/or analgesics, corticosteriod injections, flexibility and strengthening excercises, supervised PT with diminished ADL's post treatment, use of assistive devices, weight reduction as appropriate, and activity modification.  Onset of symptoms was gradual, starting 5 years ago with gradually worsening course since that time. The patient noted no past surgery on the left knee(s).  Patient currently rates pain in the left knee(s) at 10 out of 10 with activity. Patient has night pain, worsening of pain with activity and weight bearing, pain that interferes with activities of daily living, crepitus, and joint swelling.  Patient has evidence of subchondral cysts, subchondral sclerosis, periarticular osteophytes, and joint space narrowing by imaging studies. There is no active infection.  Patient Active Problem List   Diagnosis Date Noted   COVID-19 08/09/2022   Back pain 06/23/2022   Urinary retention 06/23/2022   Allergic sinusitis 04/12/2022   Rotator cuff tendinitis, right 02/03/2022   Primary osteoarthritis of right hip 06/24/2021   Primary osteoarthritis of left knee 03/29/2021   Hamstring strain, left, initial encounter 02/26/2021   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 01/29/2021   OA (osteoarthritis) of knee 08/16/2020   Neoplasm of uncertain behavior of skin 03/12/2020   Tubular adenoma of colon 10/17/2018   Hyperlipidemia 10/12/2018   Greater trochanteric bursitis of both hips 10/12/2018   Morbid obesity ([place]) 10/12/2018   Vitamin D deficiency 08/01/2017   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  Plan for 5-year recheck.   Vitamin D deficiency 08/01/2017    Past Surgical History:  Procedure Laterality Date   KIDNEY STONE SURGERY     SHOULDER SURGERY  December 2010   Left   TOTAL HIP ARTHROPLASTY Right 04/19/2022   Procedure: RIGHT TOTAL HIP ARTHROPLASTY ANTERIOR APPROACH;  Surgeon: Melrose Nakayama, MD;  Location: [facility];  Service: Orthopedics;  Laterality: Right;   UMBILICAL HERNIA REPAIR      Current Facility-Administered Medications  Medication Dose Route Frequency Provider Last Rate Last Admin   [START ON 09/20/2022] tranexamic acid (CYKLOKAPRON) 2,000 mg in sodium chloride 0.9 % 50 mL Topical Application  3,888 mg Topical To OR Melrose Nakayama, MD       Current Outpatient Medications  Medication Sig Dispense Refill Last Dose   acetaminophen (TYLENOL) 500 MG tablet Take 1,000 mg by mouth every 6 (six) hours as needed for mild pain.      lisinopril (ZESTRIL) 40 MG tablet Take 1 tablet (40 mg total) by mouth daily. 90 tablet 1    nicotine polacrilex (NICORETTE) 4 MG gum Take 4 mg by mouth as needed for smoking cessation.  predniSONE (DELTASONE) 50 MG tablet Take 1 tablet (50 mg total) by mouth daily with breakfast. (Patient not taking: Reported on 09/08/2022) 5 tablet 0 Not Taking   tamsulosin (FLOMAX) 0.4 MG CAPS capsule Take 1 capsule (0.4 mg total) by mouth daily. 30 capsule 3    amoxicillin (AMOXIL) 500 MG capsule  Take 2,000 mg by mouth See admin instructions. Take 4 capsules (2000 mg) by mouth 1 hour prior to dental appointments.      aspirin EC 81 MG tablet Take 1 tablet (81 mg total) by mouth 2 (two) times daily after a meal. For 2 weeks then once a day for 2 weeks. 45 tablet 0    ibuprofen (ADVIL) 200 MG tablet Take 400 mg by mouth every 8 (eight) hours as needed (pain.).      oxyCODONE-acetaminophen (PERCOCET) 5-325 MG tablet Take 1-2 tablets by mouth every 6 (six) hours as needed for severe pain (post op pain). (Patient not taking: Reported on 09/08/2022) 30 tablet 0 Not Taking   tiZANidine (ZANAFLEX) 4 MG tablet Take 1 tablet (4 mg total) by mouth every 6 (six) hours as needed for muscle spasms. (Patient not taking: Reported on 09/08/2022) 40 tablet 1 Not Taking   Vitamin D, Cholecalciferol, 1000 units TABS Take 2,000 Units by mouth in the morning.      Allergies  Allergen Reactions   Hydrochlorothiazide Other (See Comments)    Fatigue   Lipitor [Atorvastatin Calcium] Other (See Comments)    Myalgia    Social History   Tobacco Use   Smoking status: Every Day    Packs/day: 1.50    Years: 22.00    Total pack years: 33.00    Types: Cigarettes   Smokeless tobacco: Never  Substance Use Topics   Alcohol use: No    Family History  Problem Relation Age of Onset   Colon cancer Brother    Hypertension Mother    Parkinson's disease Mother    Stroke Brother      Review of Systems  Musculoskeletal:  Positive for arthralgias.       Left knee  All other systems reviewed and are negative.   Objective:  Physical Exam Constitutional:      Appearance: Normal appearance.  HENT:     Head: Normocephalic and atraumatic.     Nose: Nose normal.     Mouth/Throat:     Pharynx: Oropharynx is clear.  Eyes:     Extraocular Movements: Extraocular movements intact.  Pulmonary:     Effort: Pulmonary effort is normal.  Abdominal:     Palpations: Abdomen is soft.  Musculoskeletal:     Cervical back:  Normal range of motion.     Comments: Right hip motion is great.  His scar looks benign.  He has no pain there.  At the left knee has motion is about 5-120.  He has medial joint line pain and crepitation.  Hip motion is good on this side.  Sensation and motor function remain intact distally with palpable pulses at his ankles.  He does have 1+ pitting edema on both sides.    Skin:    General: Skin is warm and dry.  Neurological:     General: No focal deficit present.     Mental Status: He is alert and oriented to person, place, and time.  Psychiatric:        Mood and Affect: Mood normal.        Behavior: Behavior normal.  Thought Content: Thought content normal.        Judgment: Judgment normal.     Vital signs in last 24 hours:    Labs:   Estimated body mass index is 36.05 kg/m as calculated from the following:   Height as of 09/14/22: '5\' 4"'$  (1.626 m).   Weight as of 09/14/22: 95.3 kg.   Imaging Review Plain radiographs demonstrate severe degenerative joint disease of the left knee(s). The overall alignment isneutral. The bone quality appears to be good for age and reported activity level.    Assessment/Plan:  End stage primary arthritis, left knee   The patient history, physical examination, clinical judgment of the provider and imaging studies are consistent with end stage degenerative joint disease of the left knee(s) and total knee arthroplasty is deemed medically necessary. The treatment options including medical management, injection therapy arthroscopy and arthroplasty were discussed at length. The risks and benefits of total knee arthroplasty were presented and reviewed. The risks due to aseptic loosening, infection, stiffness, patella tracking problems, thromboembolic complications and other imponderables were discussed. The patient acknowledged the explanation, agreed to proceed with the plan and consent was signed. Patient is being admitted for inpatient treatment  for surgery, pain control, PT, OT, prophylactic antibiotics, VTE prophylaxis, progressive ambulation and ADL's and discharge planning. The patient is planning to be discharged home with home health services   Patient's anticipated LOS is less than 2 midnights, meeting these requirements: - Younger than 98 - Lives within 1 hour of care - Has a competent adult at home to recover with post-op recover - NO history of  - Chronic pain requiring opiods  - Diabetes  - Coronary Artery Disease  - Heart failure  - Heart attack  - Stroke  - DVT/VTE  - Cardiac arrhythmia  - Respiratory Failure/COPD  - Renal failure  - Anemia  - Advanced Liver disease

## 2022-09-19 NOTE — Anesthesia Preprocedure Evaluation (Signed)
Anesthesia Evaluation  Patient identified by MRN, date of birth, ID band Patient awake    Reviewed: Allergy & Precautions, NPO status , Patient's Chart, lab work & pertinent test results  History of Anesthesia Complications Negative for: history of anesthetic complications  Airway Mallampati: II  TM Distance: >3 FB Neck ROM: Full    Dental  (+) Missing,    Pulmonary Current Smoker and Patient abstained from smoking.,    Pulmonary exam normal        Cardiovascular hypertension, Pt. on medications Normal cardiovascular exam     Neuro/Psych Anxiety negative neurological ROS     GI/Hepatic negative GI ROS, Neg liver ROS,   Endo/Other  BMI 36  Renal/GU negative Renal ROS  negative genitourinary   Musculoskeletal  (+) Arthritis ,   Abdominal   Peds  Hematology negative hematology ROS (+)   Anesthesia Other Findings Day of surgery medications reviewed with patient.  Reproductive/Obstetrics negative OB ROS                            Anesthesia Physical Anesthesia Plan  ASA: 2  Anesthesia Plan: Spinal   Post-op Pain Management: Tylenol PO (pre-op)* and Regional block*   Induction:   PONV Risk Score and Plan: 2 and Treatment may vary due to age or medical condition, Ondansetron, Propofol infusion, Dexamethasone and Midazolam  Airway Management Planned: Natural Airway and Simple Face Mask  Additional Equipment: None  Intra-op Plan:   Post-operative Plan:   Informed Consent: I have reviewed the patients History and Physical, chart, labs and discussed the procedure including the risks, benefits and alternatives for the proposed anesthesia with the patient or authorized representative who has indicated his/her understanding and acceptance.       Plan Discussed with: CRNA  Anesthesia Plan Comments:        Anesthesia Quick Evaluation

## 2022-09-20 ENCOUNTER — Encounter (HOSPITAL_COMMUNITY): Payer: Self-pay | Admitting: Orthopaedic Surgery

## 2022-09-20 ENCOUNTER — Other Ambulatory Visit: Payer: Self-pay

## 2022-09-20 ENCOUNTER — Encounter (HOSPITAL_COMMUNITY)
Admission: RE | Disposition: A | Discharge status: Home / Self Care | Payer: Self-pay | Source: Ambulatory Visit | Attending: Orthopaedic Surgery

## 2022-09-20 ENCOUNTER — Ambulatory Visit (HOSPITAL_COMMUNITY)
Admission: RE | Admit: 2022-09-20 | Discharge: 2022-09-20 | Disposition: A | Discharge status: Home / Self Care | Payer: Medicare Other | Source: Ambulatory Visit | Attending: Orthopaedic Surgery | Admitting: Orthopaedic Surgery

## 2022-09-20 ENCOUNTER — Ambulatory Visit (HOSPITAL_COMMUNITY): Payer: Medicare Other | Admitting: Anesthesiology

## 2022-09-20 ENCOUNTER — Ambulatory Visit (HOSPITAL_BASED_OUTPATIENT_CLINIC_OR_DEPARTMENT_OTHER): Payer: Medicare Other | Admitting: Anesthesiology

## 2022-09-20 DIAGNOSIS — F419 Anxiety disorder, unspecified: Secondary | ICD-10-CM | POA: Diagnosis not present

## 2022-09-20 DIAGNOSIS — M1712 Unilateral primary osteoarthritis, left knee: Secondary | ICD-10-CM | POA: Diagnosis not present

## 2022-09-20 DIAGNOSIS — Z6836 Body mass index (BMI) 36.0-36.9, adult: Secondary | ICD-10-CM | POA: Diagnosis not present

## 2022-09-20 DIAGNOSIS — F17210 Nicotine dependence, cigarettes, uncomplicated: Secondary | ICD-10-CM | POA: Insufficient documentation

## 2022-09-20 DIAGNOSIS — I10 Essential (primary) hypertension: Secondary | ICD-10-CM | POA: Insufficient documentation

## 2022-09-20 DIAGNOSIS — M1990 Unspecified osteoarthritis, unspecified site: Secondary | ICD-10-CM | POA: Insufficient documentation

## 2022-09-20 DIAGNOSIS — M2550 Pain in unspecified joint: Secondary | ICD-10-CM | POA: Diagnosis not present

## 2022-09-20 DIAGNOSIS — Z96652 Presence of left artificial knee joint: Secondary | ICD-10-CM | POA: Diagnosis not present

## 2022-09-20 DIAGNOSIS — R7303 Prediabetes: Secondary | ICD-10-CM | POA: Diagnosis not present

## 2022-09-20 DIAGNOSIS — G8918 Other acute postprocedural pain: Secondary | ICD-10-CM | POA: Diagnosis not present

## 2022-09-20 HISTORY — PX: TOTAL KNEE ARTHROPLASTY: SHX125

## 2022-09-20 SURGERY — ARTHROPLASTY, KNEE, TOTAL
Anesthesia: Spinal | Site: Knee | Laterality: Left

## 2022-09-20 MED ORDER — TIZANIDINE HCL 4 MG PO TABS: 4.0000 mg | ORAL_TABLET | Freq: Four times a day (QID) | ORAL | 1 refills | Status: DC | PRN

## 2022-09-20 MED ORDER — TRANEXAMIC ACID 1000 MG/10ML IV SOLN
INTRAVENOUS | Status: DC | PRN
  Administered 2022-09-20: 2000 mg via TOPICAL

## 2022-09-20 MED ORDER — METHOCARBAMOL 500 MG IVPB - SIMPLE MED
INTRAVENOUS | Status: AC
  Filled 2022-09-20: qty 55

## 2022-09-20 MED ORDER — LACTATED RINGERS IV BOLUS
250.0000 mL | Freq: Once | INTRAVENOUS | Status: AC
  Administered 2022-09-20: 250 mL via INTRAVENOUS

## 2022-09-20 MED ORDER — CEFAZOLIN SODIUM-DEXTROSE 2-4 GM/100ML-% IV SOLN
2.0000 g | INTRAVENOUS | Status: AC
  Administered 2022-09-20: 2 g via INTRAVENOUS
  Filled 2022-09-20: qty 100

## 2022-09-20 MED ORDER — SODIUM CHLORIDE 0.9 % IR SOLN
Status: DC | PRN
  Administered 2022-09-20: 3000 mL

## 2022-09-20 MED ORDER — ACETAMINOPHEN 325 MG PO TABS: 325.0000 mg | ORAL_TABLET | Freq: Four times a day (QID) | ORAL | Status: DC | PRN

## 2022-09-20 MED ORDER — ACETAMINOPHEN 500 MG PO TABS: 1000.0000 mg | ORAL_TABLET | Freq: Four times a day (QID) | ORAL | Status: DC

## 2022-09-20 MED ORDER — BUPIVACAINE LIPOSOME 1.3 % IJ SUSP: 20.0000 mL | Freq: Once | INTRAMUSCULAR | Status: DC

## 2022-09-20 MED ORDER — ACETAMINOPHEN 500 MG PO TABS
1000.0000 mg | ORAL_TABLET | Freq: Once | ORAL | Status: AC
  Administered 2022-09-20: 1000 mg via ORAL
  Filled 2022-09-20: qty 2

## 2022-09-20 MED ORDER — OXYCODONE HCL 5 MG PO TABS
ORAL_TABLET | ORAL | Status: AC
  Filled 2022-09-20: qty 1

## 2022-09-20 MED ORDER — OXYCODONE HCL 5 MG PO TABS
5.0000 mg | ORAL_TABLET | ORAL | Status: DC | PRN
  Administered 2022-09-20: 5 mg via ORAL

## 2022-09-20 MED ORDER — PHENYLEPHRINE HCL-NACL 20-0.9 MG/250ML-% IV SOLN
INTRAVENOUS | Status: DC | PRN
  Administered 2022-09-20: 50 ug/min via INTRAVENOUS

## 2022-09-20 MED ORDER — METOCLOPRAMIDE HCL 5 MG PO TABS: 5.0000 mg | ORAL_TABLET | Freq: Three times a day (TID) | ORAL | Status: DC | PRN

## 2022-09-20 MED ORDER — ORAL CARE MOUTH RINSE: 15.0000 mL | Freq: Once | OROMUCOSAL | Status: AC

## 2022-09-20 MED ORDER — OXYCODONE HCL 5 MG PO TABS: 10.0000 mg | ORAL_TABLET | ORAL | Status: DC | PRN

## 2022-09-20 MED ORDER — CHLORHEXIDINE GLUCONATE 0.12 % MT SOLN
15.0000 mL | Freq: Once | OROMUCOSAL | Status: AC
  Administered 2022-09-20: 15 mL via OROMUCOSAL

## 2022-09-20 MED ORDER — TRANEXAMIC ACID-NACL 1000-0.7 MG/100ML-% IV SOLN
1000.0000 mg | INTRAVENOUS | Status: AC
  Administered 2022-09-20: 1000 mg via INTRAVENOUS
  Filled 2022-09-20: qty 100

## 2022-09-20 MED ORDER — OXYCODONE-ACETAMINOPHEN 5-325 MG PO TABS: 1.0000 | ORAL_TABLET | Freq: Four times a day (QID) | ORAL | 0 refills | Status: DC | PRN

## 2022-09-20 MED ORDER — PROPOFOL 500 MG/50ML IV EMUL
INTRAVENOUS | Status: AC
  Filled 2022-09-20: qty 50

## 2022-09-20 MED ORDER — SODIUM CHLORIDE 0.9 % IV SOLN
INTRAVENOUS | Status: DC | PRN
  Administered 2022-09-20: 80 mL

## 2022-09-20 MED ORDER — FENTANYL CITRATE PF 50 MCG/ML IJ SOSY
100.0000 ug | PREFILLED_SYRINGE | INTRAMUSCULAR | Status: DC
  Administered 2022-09-20: 50 ug via INTRAVENOUS
  Filled 2022-09-20: qty 2

## 2022-09-20 MED ORDER — ONDANSETRON HCL 4 MG/2ML IJ SOLN: 4.0000 mg | Freq: Four times a day (QID) | INTRAMUSCULAR | Status: DC | PRN

## 2022-09-20 MED ORDER — LACTATED RINGERS IV SOLN: INTRAVENOUS | Status: DC

## 2022-09-20 MED ORDER — METHOCARBAMOL 500 MG IVPB - SIMPLE MED
500.0000 mg | Freq: Four times a day (QID) | INTRAVENOUS | Status: DC | PRN
  Administered 2022-09-20: 500 mg via INTRAVENOUS

## 2022-09-20 MED ORDER — FENTANYL CITRATE PF 50 MCG/ML IJ SOSY
25.0000 ug | PREFILLED_SYRINGE | INTRAMUSCULAR | Status: DC | PRN
  Administered 2022-09-20: 50 ug via INTRAVENOUS

## 2022-09-20 MED ORDER — DEXAMETHASONE SODIUM PHOSPHATE 10 MG/ML IJ SOLN
INTRAMUSCULAR | Status: DC | PRN
  Administered 2022-09-20: 10 mg via INTRAVENOUS

## 2022-09-20 MED ORDER — KETOROLAC TROMETHAMINE 15 MG/ML IJ SOLN: 7.5000 mg | Freq: Four times a day (QID) | INTRAMUSCULAR | Status: DC

## 2022-09-20 MED ORDER — PROPOFOL 500 MG/50ML IV EMUL
INTRAVENOUS | Status: DC | PRN
  Administered 2022-09-20: 50 ug/kg/min via INTRAVENOUS

## 2022-09-20 MED ORDER — OXYCODONE HCL 5 MG PO TABS
5.0000 mg | ORAL_TABLET | Freq: Once | ORAL | Status: AC | PRN
  Administered 2022-09-20: 5 mg via ORAL

## 2022-09-20 MED ORDER — PHENYLEPHRINE HCL-NACL 20-0.9 MG/250ML-% IV SOLN
INTRAVENOUS | Status: AC
  Filled 2022-09-20: qty 250

## 2022-09-20 MED ORDER — POVIDONE-IODINE 10 % EX SWAB
2.0000 | Freq: Once | CUTANEOUS | Status: AC
  Administered 2022-09-20: 2 via TOPICAL

## 2022-09-20 MED ORDER — TRANEXAMIC ACID-NACL 1000-0.7 MG/100ML-% IV SOLN
INTRAVENOUS | Status: AC
  Filled 2022-09-20: qty 100

## 2022-09-20 MED ORDER — ONDANSETRON HCL 4 MG/2ML IJ SOLN
INTRAMUSCULAR | Status: DC | PRN
  Administered 2022-09-20: 4 mg via INTRAVENOUS

## 2022-09-20 MED ORDER — CEFAZOLIN SODIUM-DEXTROSE 2-4 GM/100ML-% IV SOLN
2.0000 g | Freq: Four times a day (QID) | INTRAVENOUS | Status: DC
  Administered 2022-09-20: 2 g via INTRAVENOUS

## 2022-09-20 MED ORDER — CLONIDINE HCL (ANALGESIA) 100 MCG/ML EP SOLN
EPIDURAL | Status: DC | PRN
  Administered 2022-09-20: 100 ug

## 2022-09-20 MED ORDER — LACTATED RINGERS IV BOLUS
500.0000 mL | Freq: Once | INTRAVENOUS | Status: AC
  Administered 2022-09-20: 500 mL via INTRAVENOUS

## 2022-09-20 MED ORDER — SODIUM CHLORIDE (PF) 0.9 % IJ SOLN
INTRAMUSCULAR | Status: AC
  Filled 2022-09-20: qty 30

## 2022-09-20 MED ORDER — HYDROMORPHONE HCL 1 MG/ML IJ SOLN: 0.5000 mg | INTRAMUSCULAR | Status: DC | PRN

## 2022-09-20 MED ORDER — MEPIVACAINE HCL (PF) 2 % IJ SOLN
INTRAMUSCULAR | Status: DC | PRN
  Administered 2022-09-20: 3 mL via EPIDURAL

## 2022-09-20 MED ORDER — ONDANSETRON HCL 4 MG PO TABS: 4.0000 mg | ORAL_TABLET | Freq: Four times a day (QID) | ORAL | Status: DC | PRN

## 2022-09-20 MED ORDER — CEFAZOLIN SODIUM-DEXTROSE 2-4 GM/100ML-% IV SOLN
INTRAVENOUS | Status: AC
  Filled 2022-09-20: qty 100

## 2022-09-20 MED ORDER — TRANEXAMIC ACID-NACL 1000-0.7 MG/100ML-% IV SOLN: 1000.0000 mg | Freq: Once | INTRAVENOUS | Status: DC

## 2022-09-20 MED ORDER — BUPIVACAINE-EPINEPHRINE (PF) 0.5% -1:200000 IJ SOLN
INTRAMUSCULAR | Status: AC
  Filled 2022-09-20: qty 30

## 2022-09-20 MED ORDER — METHOCARBAMOL 500 MG PO TABS: 500.0000 mg | ORAL_TABLET | Freq: Four times a day (QID) | ORAL | Status: DC | PRN

## 2022-09-20 MED ORDER — METOCLOPRAMIDE HCL 5 MG/ML IJ SOLN: 5.0000 mg | Freq: Three times a day (TID) | INTRAMUSCULAR | Status: DC | PRN

## 2022-09-20 MED ORDER — PROPOFOL 10 MG/ML IV BOLUS
INTRAVENOUS | Status: DC | PRN
  Administered 2022-09-20: 20 mg via INTRAVENOUS
  Administered 2022-09-20: 10 mg via INTRAVENOUS

## 2022-09-20 MED ORDER — MIDAZOLAM HCL 2 MG/2ML IJ SOLN
2.0000 mg | INTRAMUSCULAR | Status: DC
  Administered 2022-09-20: 1 mg via INTRAVENOUS
  Filled 2022-09-20: qty 2

## 2022-09-20 MED ORDER — 0.9 % SODIUM CHLORIDE (POUR BTL) OPTIME
TOPICAL | Status: DC | PRN
  Administered 2022-09-20: 1000 mL

## 2022-09-20 MED ORDER — BUPIVACAINE LIPOSOME 1.3 % IJ SUSP
INTRAMUSCULAR | Status: AC
  Filled 2022-09-20: qty 20

## 2022-09-20 MED ORDER — ASPIRIN EC 81 MG PO TBEC: 81.0000 mg | DELAYED_RELEASE_TABLET | Freq: Two times a day (BID) | ORAL | 0 refills | Status: DC

## 2022-09-20 MED ORDER — FENTANYL CITRATE PF 50 MCG/ML IJ SOSY
PREFILLED_SYRINGE | INTRAMUSCULAR | Status: AC
  Filled 2022-09-20: qty 1

## 2022-09-20 MED ORDER — BUPIVACAINE-EPINEPHRINE (PF) 0.5% -1:200000 IJ SOLN
INTRAMUSCULAR | Status: DC | PRN
  Administered 2022-09-20: 15 mL via PERINEURAL

## 2022-09-20 MED ORDER — OXYCODONE HCL 5 MG/5ML PO SOLN: 5.0000 mg | Freq: Once | ORAL | Status: AC | PRN

## 2022-09-20 MED ORDER — TRANEXAMIC ACID-NACL 1000-0.7 MG/100ML-% IV SOLN
1000.0000 mg | Freq: Once | INTRAVENOUS | Status: AC
  Administered 2022-09-20: 1000 mg via INTRAVENOUS

## 2022-09-20 SURGICAL SUPPLY — 53 items
ATTUNE PS FEM LT SZ 5 CEM KNEE (Femur) IMPLANT
ATTUNE PSRP INSE SZ5 7 KNEE (Insert) IMPLANT
BAG COUNTER SPONGE SURGICOUNT (BAG) ×1 IMPLANT
BAG DECANTER FOR FLEXI CONT (MISCELLANEOUS) ×1 IMPLANT
BAG SPNG CNTER NS LX DISP (BAG) ×1
BAG ZIPLOCK 12X15 (MISCELLANEOUS) ×1 IMPLANT
BASE TIBIAL ROT PLAT SZ 7 KNEE (Knees) IMPLANT
BLADE SAGITTAL 25.0X1.19X90 (BLADE) ×1 IMPLANT
BLADE SAW SGTL 11.0X1.19X90.0M (BLADE) ×1 IMPLANT
BLADE SURG SZ10 CARB STEEL (BLADE) ×1 IMPLANT
BNDG ELASTIC 6X5.8 VLCR STR LF (GAUZE/BANDAGES/DRESSINGS) ×1 IMPLANT
BOOTIES KNEE HIGH SLOAN (MISCELLANEOUS) ×1 IMPLANT
BOWL SMART MIX CTS (DISPOSABLE) ×1 IMPLANT
BSPLAT TIB 7 CMNT ROT PLAT STR (Knees) ×1 IMPLANT
CEMENT HV SMART SET (Cement) ×2 IMPLANT
COVER SURGICAL LIGHT HANDLE (MISCELLANEOUS) ×1 IMPLANT
CUFF TOURN SGL QUICK 34 (TOURNIQUET CUFF) ×1
CUFF TRNQT CYL 34X4.125X (TOURNIQUET CUFF) ×1 IMPLANT
DRAPE INCISE IOBAN 66X45 STRL (DRAPES) ×1 IMPLANT
DRAPE SHEET LG 3/4 BI-LAMINATE (DRAPES) ×1 IMPLANT
DRAPE TOP 10253 STERILE (DRAPES) ×1 IMPLANT
DRAPE U-SHAPE 47X51 STRL (DRAPES) ×1 IMPLANT
DRSG AQUACEL AG ADV 3.5X10 (GAUZE/BANDAGES/DRESSINGS) ×1 IMPLANT
DURAPREP 26ML APPLICATOR (WOUND CARE) ×2 IMPLANT
ELECT REM PT RETURN 15FT ADLT (MISCELLANEOUS) ×1 IMPLANT
GLOVE BIO SURGEON STRL SZ8 (GLOVE) ×2 IMPLANT
GLOVE BIOGEL PI IND STRL 8 (GLOVE) ×2 IMPLANT
GOWN STRL REUS W/ TWL XL LVL3 (GOWN DISPOSABLE) ×2 IMPLANT
GOWN STRL REUS W/TWL XL LVL3 (GOWN DISPOSABLE) ×2
HANDPIECE INTERPULSE COAX TIP (DISPOSABLE) ×1
HOLDER FOLEY CATH W/STRAP (MISCELLANEOUS) IMPLANT
HOOD PEEL AWAY FLYTE STAYCOOL (MISCELLANEOUS) ×3 IMPLANT
KIT TURNOVER KIT A (KITS) IMPLANT
MANIFOLD NEPTUNE II (INSTRUMENTS) ×1 IMPLANT
NEEDLE HYPO 22GX1.5 SAFETY (NEEDLE) ×1 IMPLANT
NS IRRIG 1000ML POUR BTL (IV SOLUTION) ×1 IMPLANT
PACK TOTAL KNEE CUSTOM (KITS) ×1 IMPLANT
PAD ARMBOARD 7.5X6 YLW CONV (MISCELLANEOUS) ×1 IMPLANT
PATELLA MEDIAL ATTUN 35MM KNEE (Knees) IMPLANT
PROTECTOR NERVE ULNAR (MISCELLANEOUS) ×1 IMPLANT
SET HNDPC FAN SPRY TIP SCT (DISPOSABLE) ×1 IMPLANT
SPIKE FLUID TRANSFER (MISCELLANEOUS) ×2 IMPLANT
SUT ETHIBOND NAB CT1 #1 30IN (SUTURE) ×2 IMPLANT
SUT VIC AB 0 CT1 36 (SUTURE) ×1 IMPLANT
SUT VIC AB 2-0 CT1 27 (SUTURE) ×1
SUT VIC AB 2-0 CT1 TAPERPNT 27 (SUTURE) ×1 IMPLANT
SUT VICRYL AB 3-0 FS1 BRD 27IN (SUTURE) ×1 IMPLANT
SUT VLOC 180 0 24IN GS25 (SUTURE) ×1 IMPLANT
TIBIAL BASE ROT PLAT SZ 7 KNEE (Knees) ×1 IMPLANT
TRAY FOLEY MTR SLVR 16FR STAT (SET/KITS/TRAYS/PACK) IMPLANT
WATER STERILE IRR 1000ML POUR (IV SOLUTION) ×1 IMPLANT
WRAP KNEE MAXI GEL POST OP (GAUZE/BANDAGES/DRESSINGS) ×1 IMPLANT
YANKAUER SUCT BULB TIP NO VENT (SUCTIONS) ×1 IMPLANT

## 2022-09-20 NOTE — Anesthesia Procedure Notes (Signed)
Spinal  Patient location during procedure: OR Start time: 09/20/2022 12:33 PM End time: 09/20/2022 12:35 PM Reason for block: surgical anesthesia Staffing Performed: resident/CRNA  Anesthesiologist: Brennan Bailey, MD Resident/CRNA: Lind Covert, CRNA Performed by: Lind Covert, CRNA Authorized by: Brennan Bailey, MD   Preanesthetic Checklist Completed: patient identified, IV checked, site marked, risks and benefits discussed, surgical consent, monitors and equipment checked, pre-op evaluation and timeout performed Spinal Block Patient position: sitting Prep: DuraPrep Patient monitoring: heart rate, cardiac monitor, continuous pulse ox and blood pressure Approach: midline Location: L3-4 Injection technique: single-shot Needle Needle type: Pencan  Needle gauge: 24 G Needle length: 10 cm Needle insertion depth: 7 cm Assessment Sensory level: T6 Events: CSF return Additional Notes Timeout performed. Patient in sitting position. L-3-4 identified. Cleansed with duraprep. SAB without difficulty. To supine position.

## 2022-09-20 NOTE — Op Note (Signed)
PREOP DIAGNOSIS: DJD LEFT KNEE POSTOP DIAGNOSIS:  same PROCEDURE: LEFT TKR ANESTHESIA: Spinal and MAC ATTENDING SURGEON: Erik Estrada ASSISTANT: Erik Dolly PA  INDICATIONS FOR PROCEDURE: Erik Estrada is a 71 y.o. male who has struggled for a long time with pain due to degenerative arthritis of the left knee.  The patient has failed many conservative non-operative measures and at this point has pain which limits the ability to sleep and walk.  The patient is offered total knee replacement.  Informed operative consent was obtained after discussion of possible risks of anesthesia, infection, neurovascular injury, DVT, and death.  The importance of the post-operative rehabilitation protocol to optimize result was stressed extensively with the patient.  SUMMARY OF FINDINGS AND PROCEDURE:  Erik Estrada was taken to the operative suite where under the above anesthesia a left knee replacement was performed.  There were advanced degenerative changes and the bone quality was good.  We used the DePuy Attune system and placed size 5 femur, 7 tibia, 35 mm all polyethylene patella, and a size 7 mm spacer.  Erik Dolly PA-C assisted throughout and was invaluable to the completion of the case in that he helped retract and maintain exposure while I placed the components.  He also helped close thereby minimizing OR time.  The patient was admitted for appropriate post-op care to include perioperative antibiotics and mechanical and pharmacologic measures for DVT prophylaxis.  DESCRIPTION OF PROCEDURE:  Erik Estrada was taken to the operative suite where the above anesthesia was applied.  The patient was positioned supine and prepped and draped in normal sterile fashion.  An appropriate time out was performed.  After the administration of kefzol pre-op antibiotic the leg was elevated and exsanguinated and a tourniquet inflated.  A standard longitudinal incision was made on the anterior knee.  Dissection was  carried down to the extensor mechanism.  All appropriate anti-infective measures were used including the pre-operative antibiotic, betadine impregnated drape, and closed hooded exhaust systems for each member of the surgical team.  A medial parapatellar incision was made in the extensor mechanism and the knee cap flipped and the knee flexed.  Some residual meniscal tissues were removed along with any remaining ACL/PCL tissue.  A guide was placed on the tibia and a flat cut was made on it's superior surface.  An intramedullary guide was placed in the femur and was utilized to make anterior and posterior cuts creating an appropriate flexion gap.  A second intramedullary guide was placed in the femur to make a distal cut properly balancing the knee with an extension gap equal to the flexion gap.  The three bones sized to the above mentioned sizes and the appropriate guides were placed and utilized.  A trial reduction was done and the knee easily came to full extension and the patella tracked well on flexion.  The trial components were removed and all bones were cleaned with pulsatile lavage and then dried thoroughly.  Cement was mixed and was pressurized onto the bones followed by placement of the aforementioned components.  Excess cement was trimmed and pressure was held on the components until the cement had hardened.  The tourniquet was deflated and a small amount of bleeding was controlled with cautery and pressure.  The knee was irrigated thoroughly.  The extensor mechanism was re-approximated with #1 ethibond in interrupted fashion.  The knee was flexed and the repair was solid.  The subcutaneous tissues were re-approximated with #0 and #2-0 vicryl and the skin closed with  a subcuticular stitch and steristrips.  A sterile dressing was applied.  Intraoperative fluids, EBL, and tourniquet time can be obtained from anesthesia records.  DISPOSITION:  The patient was taken to recovery room in stable condition and  scheduled to potentially go home same day depending on ability to walk and tolerate liquids.  Erik Estrada 09/20/2022, 1:58 PM

## 2022-09-20 NOTE — Interval H&P Note (Signed)
History and Physical Interval Note:  09/20/2022 11:34 AM  Erik Estrada  has presented today for surgery, with the diagnosis of LEFT KNEE DEGENERATIVE JOINT DISEASE.  The various methods of treatment have been discussed with the patient and family. After consideration of risks, benefits and other options for treatment, the patient has consented to  Procedure(s): LEFT TOTAL KNEE ARTHROPLASTY (Left) as a surgical intervention.  The patient's history has been reviewed, patient examined, no change in status, stable for surgery.  I have reviewed the patient's chart and labs.  Questions were answered to the patient's satisfaction.     Hessie Dibble

## 2022-09-20 NOTE — Anesthesia Procedure Notes (Signed)
Anesthesia Regional Block: Adductor canal block   Pre-Anesthetic Checklist: , timeout performed,  Correct Patient, Correct Site, Correct Laterality,  Correct Procedure, Correct Position, site marked,  Risks and benefits discussed,  Pre-op evaluation,  At surgeon's request and post-op pain management  Laterality: Left  Prep: Maximum Sterile Barrier Precautions used, chloraprep       Needles:  Injection technique: Single-shot  Needle Type: Echogenic Stimulator Needle     Needle Length: 9cm  Needle Gauge: 22     Additional Needles:   Procedures:,,,, ultrasound used (permanent image in chart),,    Narrative:  Start time: 09/20/2022 11:55 AM End time: 09/20/2022 11:58 AM Injection made incrementally with aspirations every 5 mL.  Performed by: Personally  Anesthesiologist: Brennan Bailey, MD  Additional Notes: Risks, benefits, and alternative discussed. Patient gave consent for procedure. Patient prepped and draped in sterile fashion. Sedation administered, patient remains easily responsive to voice. Relevant anatomy identified with ultrasound guidance. Local anesthetic given in 5cc increments with no signs or symptoms of intravascular injection. No pain or paraesthesias with injection. Patient monitored throughout procedure with signs of LAST or immediate complications. Tolerated well. Ultrasound image placed in chart.  Tawny Asal, MD

## 2022-09-20 NOTE — Evaluation (Signed)
Physical Therapy Evaluation Patient Details Name: Erik Estrada MRN: [id number] DOB: 1951-11-18 Today's Date: 09/20/2022  History of Present Illness  Pt is a 71yo male presenting s/p L-TKA on 09/20/22. 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 male POD 0 s/p L-TKA. Patient reports modified independence using "hurry cane" with mobility at baseline. Patient is now limited by functional impairments (see PT problem list below) and requires min guard for transfers and gait with RW. Patient was able to ambulate 100 feet with RW and min guard and cues for safe walker management. Patient educated on safe sequencing for stair mobility and verbalized safe guarding position for people assisting with mobility. Patient instructed in exercises to facilitate ROM and circulation. Patient will benefit from continued skilled PT interventions to address impairments and progress towards PLOF. Patient has met mobility goals at adequate level for discharge home; will continue to follow if pt continues acute stay to progress towards Mod I goals.       Recommendations for follow up therapy are one component of a multi-disciplinary discharge planning process, led by the attending physician.  Recommendations may be updated based on patient status, additional functional criteria and insurance authorization.  Follow Up Recommendations Follow physician's recommendations for discharge plan and follow up therapies      Assistance Recommended at Discharge Frequent or constant Supervision/Assistance  Patient can return home with the following  A little help with walking and/or transfers;A little help with bathing/dressing/bathroom;Assistance with cooking/housework;Assist for transportation;Help with stairs or ramp for entrance    Equipment Recommendations None recommended by PT  Recommendations for Other Services       Functional Status Assessment  Patient has had a recent decline in their functional status and demonstrates the ability to make significant improvements in function in a reasonable and predictable amount of time.     Precautions / Restrictions Precautions Precautions: Fall Restrictions Weight Bearing Restrictions: No Other Position/Activity Restrictions: wbat      Mobility  Bed Mobility Overal bed mobility: Needs Assistance Bed Mobility: Supine to Sit     Supine to sit: Min assist     General bed mobility comments: Min assist to bring LLE off bed    Transfers Overall transfer level: Needs assistance Equipment used: Rolling walker (2 wheels) Transfers: Sit to/from Stand Sit to Stand: Min guard, From elevated surface           General transfer comment: Min guard for safety froms tretcher, no physical assist requried or overt LOB noted.    Ambulation/Gait Ambulation/Gait assistance: Min guard Gait Distance (Feet): 100 Feet Assistive device: Rolling walker (2 wheels) Gait Pattern/deviations: Step-to pattern Gait velocity: decreased     General Gait Details: Pt ambulated with RW and min guard no phsyical assist or overt LOB noted.  Stairs Stairs: Yes Stairs assistance: Min guard Stair Management: One rail Right, Step to pattern, Forwards, With cane Number of Stairs: 2 General stair comments: Pt completed stair training without overt LOB and min guard for safety, VCs for sequencing.  Wheelchair Mobility    Modified Rankin (Stroke Patients Only)       Balance Overall balance assessment: Needs assistance Sitting-balance support: Feet supported, No upper extremity supported Sitting balance-Leahy Scale: Good     Standing balance support: Reliant on assistive device for balance, During functional activity, Bilateral upper extremity supported Standing balance-Leahy Scale: Poor  Pertinent Vitals/Pain Pain Assessment Pain Assessment: 0-10 Pain  Score: 6  Pain Location: left knee Pain Descriptors / Indicators: Operative site guarding Pain Intervention(s): Limited activity within patient's tolerance, Monitored during session, Repositioned, Ice applied    Home Living Family/patient expects to be discharged to:: Private residence Living Arrangements: Spouse/significant other Available Help at Discharge: Family;Available 24 hours/day Type of Home: House Home Access: Level entry       Home Layout: Two level;Able to live on main level with bedroom/bathroom;Full bath on main level Home Equipment: Cane - single point;Rolling Walker (2 wheels);Shower seat - built in      Prior Function Prior Level of Function : Independent/Modified Independent             Mobility Comments: SPC at al times ADLs Comments: Wife helps with getting socks and shoes on as well as pants sometimes     Hand Dominance        Extremity/Trunk Assessment   Upper Extremity Assessment Upper Extremity Assessment: Overall WFL for tasks assessed    Lower Extremity Assessment Lower Extremity Assessment: RLE deficits/detail;LLE deficits/detail RLE Deficits / Details: MMT ank DF/PF 5/5 RLE Sensation: WNL LLE Deficits / Details: MMT ank DF/PF 5/5, pt able to complete SLR but demonstrates strong extensor lag LLE Sensation: WNL    Cervical / Trunk Assessment Cervical / Trunk Assessment: Kyphotic  Communication   Communication: No difficulties  Cognition Arousal/Alertness: Lethargic Behavior During Therapy: WFL for tasks assessed/performed Overall Cognitive Status: Within Functional Limits for tasks assessed                                          General Comments      Exercises Total Joint Exercises Ankle Circles/Pumps: AROM, Both, 10 reps Quad Sets: AROM, Left, Other reps (comment) (2) Short Arc Quad: AROM, Left, Other reps (comment) (2) Heel Slides: AROM, Left, Other reps (comment) (2) Hip ABduction/ADduction: AROM, Left,  Other reps (comment) (2) Straight Leg Raises: AROM, Left, Other reps (comment) (2)   Assessment/Plan    PT Assessment Patient needs continued PT services  PT Problem List Decreased strength;Decreased range of motion;Decreased activity tolerance;Decreased balance;Decreased mobility;Decreased coordination;Pain       PT Treatment Interventions DME instruction;Gait training;Stair training;Functional mobility training;Therapeutic activities;Therapeutic exercise;Balance training;Neuromuscular re-education;Patient/family education    PT Goals (Current goals can be found in the Care Plan section)  Acute Rehab PT Goals Patient Stated Goal: To walk wtihout pain PT Goal Formulation: With patient Time For Goal Achievement: 09/27/22 Potential to Achieve Goals: Good    Frequency 7X/week     Co-evaluation               AM-PAC PT "6 Clicks" Mobility  Outcome Measure Help needed turning from your back to your side while in a flat bed without using bedrails?: None Help needed moving from lying on your back to sitting on the side of a flat bed without using bedrails?: A Little Help needed moving to and from a bed to a chair (including a wheelchair)?: A Little Help needed standing up from a chair using your arms (e.g., wheelchair or bedside chair)?: A Little Help needed to walk in hospital room?: A Little Help needed climbing 3-5 steps with a railing? : A Little 6 Click Score: 19    End of Session Equipment Utilized During Treatment: Gait belt Activity Tolerance: Patient tolerated treatment well;No increased pain Patient  left: in chair;with call bell/phone within reach (stretcher in PACU) Nurse Communication: Mobility status PT Visit Diagnosis: Pain;Difficulty in walking, not elsewhere classified (R26.2) Pain - Right/Left: Left Pain - part of body: Knee    Time: 0029-8473 PT Time Calculation (min) (ACUTE ONLY): 35 min   Charges:   PT Evaluation $PT Eval Low Complexity: 1 Low PT  Treatments $Gait Training: 8-22 mins        Coolidge Breeze, PT, DPT [facility] Department Office: [phone] Weekend pager: [phone]  Coolidge Breeze 09/20/2022, 7:36 PM

## 2022-09-20 NOTE — Transfer of Care (Signed)
Immediate Anesthesia Transfer of Care Note  Patient: Ajmal Kathan  Procedure(s) Performed: LEFT TOTAL KNEE ARTHROPLASTY (Left: Knee)  Patient Location: PACU  Anesthesia Type:Spinal  Level of Consciousness: sedated  Airway & Oxygen Therapy: Patient Spontanous Breathing and Patient connected to face mask oxygen  Post-op Assessment: Report given to RN and Post -op Vital signs reviewed and stable  Post vital signs: Reviewed and stable  Last Vitals:  Vitals Value Taken Time  BP 98/56 09/20/22 1421  Temp    Pulse 75 09/20/22 1422  Resp 15 09/20/22 1422  SpO2 98 % 09/20/22 1422  Vitals shown include unvalidated device data.  Last Pain:  Vitals:   09/20/22 1019  TempSrc: Oral         Complications: No notable events documented.

## 2022-09-20 NOTE — Anesthesia Postprocedure Evaluation (Signed)
Anesthesia Post Note  Patient: Paxtyn Boyar  Procedure(s) Performed: LEFT TOTAL KNEE ARTHROPLASTY (Left: Knee)     Patient location during evaluation: PACU Anesthesia Type: Spinal Level of consciousness: awake and alert Pain management: pain level controlled Vital Signs Assessment: post-procedure vital signs reviewed and stable Respiratory status: spontaneous breathing, nonlabored ventilation and respiratory function stable Cardiovascular status: blood pressure returned to baseline Postop Assessment: no apparent nausea or vomiting, spinal receding, no headache and no backache Anesthetic complications: no   No notable events documented.  Last Vitals:  Vitals:   09/20/22 1515 09/20/22 1530  BP: 122/68 123/70  Pulse: 74 76  Resp: 13 13  Temp:  (!) 36.4 C  SpO2: 99% 100%    Last Pain:  Vitals:   09/20/22 1530  TempSrc:   PainSc: 0-No pain    LLE Motor Response: No movement due to regional block (09/20/22 1530)   RLE Motor Response: No movement due to regional block (09/20/22 1530)   L Sensory Level: L3-Anterior knee, lower leg (09/20/22 1530) R Sensory Level: L3-Anterior knee, lower leg (09/20/22 1530)  Marthenia Rolling

## 2022-09-21 ENCOUNTER — Encounter (HOSPITAL_COMMUNITY): Payer: Self-pay | Admitting: Orthopaedic Surgery

## 2022-09-22 ENCOUNTER — Other Ambulatory Visit: Payer: Self-pay

## 2022-09-22 ENCOUNTER — Ambulatory Visit: Payer: Medicare Other | Attending: Orthopaedic Surgery | Admitting: Rehabilitative and Restorative Service Providers"

## 2022-09-22 ENCOUNTER — Other Ambulatory Visit: Payer: Self-pay | Admitting: Family Medicine

## 2022-09-22 ENCOUNTER — Encounter: Payer: Self-pay | Admitting: Rehabilitative and Restorative Service Providers"

## 2022-09-22 DIAGNOSIS — R600 Localized edema: Secondary | ICD-10-CM | POA: Diagnosis not present

## 2022-09-22 DIAGNOSIS — Z96659 Presence of unspecified artificial knee joint: Secondary | ICD-10-CM | POA: Diagnosis not present

## 2022-09-22 DIAGNOSIS — M25651 Stiffness of right hip, not elsewhere classified: Secondary | ICD-10-CM | POA: Diagnosis not present

## 2022-09-22 DIAGNOSIS — M25561 Pain in right knee: Secondary | ICD-10-CM | POA: Insufficient documentation

## 2022-09-22 DIAGNOSIS — M6281 Muscle weakness (generalized): Secondary | ICD-10-CM | POA: Diagnosis not present

## 2022-09-22 DIAGNOSIS — G8929 Other chronic pain: Secondary | ICD-10-CM | POA: Diagnosis not present

## 2022-09-22 DIAGNOSIS — R269 Unspecified abnormalities of gait and mobility: Secondary | ICD-10-CM

## 2022-09-22 DIAGNOSIS — R29898 Other symptoms and signs involving the musculoskeletal system: Secondary | ICD-10-CM | POA: Insufficient documentation

## 2022-09-22 DIAGNOSIS — R293 Abnormal posture: Secondary | ICD-10-CM | POA: Insufficient documentation

## 2022-09-22 NOTE — Therapy (Signed)
OUTPATIENT PHYSICAL THERAPY LOWER EXTREMITY EVALUATION   Patient Name: Erik Estrada MRN: [id number] DOB:May 08, 1951, 71 y.o., male Today's Date: 09/22/2022   PT End of Session - 09/22/22 1108     Visit Number 1    Number of Visits 16    Date for PT Re-Evaluation 11/17/22    Authorization Type Medicare    Authorization - Visit Number 1    Progress Note Due on Visit 10    PT Start Time 1100    PT Stop Time 1152    PT Time Calculation (min) 52 min    Activity Tolerance Patient tolerated treatment well             Past Medical History:  Diagnosis Date   Anxiety    Arthritis    Essential hypertension, benign 06/29/2011   Hemorrhoids 08/01/2018   History of kidney stones    Morbid obesity ([place]) 10/12/2018   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  Plan for 5-year recheck.   Vitamin D deficiency 08/01/2017   Past Surgical History:  Procedure Laterality Date   KIDNEY STONE SURGERY     SHOULDER SURGERY  December 2010   Left   TOTAL HIP ARTHROPLASTY Right 04/19/2022   Procedure: RIGHT TOTAL HIP ARTHROPLASTY ANTERIOR APPROACH;  Surgeon: Melrose Nakayama, MD;  Location: [facility];  Service: Orthopedics;  Laterality: Right;   TOTAL KNEE ARTHROPLASTY Left 09/20/2022   Procedure: LEFT TOTAL KNEE ARTHROPLASTY;  Surgeon: Melrose Nakayama, MD;  Location: [facility];  Service: Orthopedics;  Laterality: Left;   UMBILICAL HERNIA REPAIR     Patient Active Problem List   Diagnosis Date Noted   COVID-19 08/09/2022   Back pain 06/23/2022   Urinary retention 06/23/2022   Allergic sinusitis 04/12/2022   Rotator cuff tendinitis, right 02/03/2022   Primary osteoarthritis of right hip 06/24/2021   Primary osteoarthritis of left knee 03/29/2021   Hamstring strain, left, initial encounter 02/26/2021   Degenerative tear of posterior horn of lateral meniscus of  left knee 01/29/2021   OA (osteoarthritis) of knee 08/16/2020   Neoplasm of uncertain behavior of skin 03/12/2020   Tubular adenoma of colon 10/17/2018   Hyperlipidemia 10/12/2018   Greater trochanteric bursitis of both hips 10/12/2018   Morbid obesity ([place]) 10/12/2018   Vitamin D deficiency 08/01/2017   Neurotic excoriations 07/27/2016   Family history of colon cancer requiring screening colonoscopy 05/16/2012   Prediabetes 05/16/2012   Tobacco abuse 07/20/2011   Hypogonadism male 07/20/2011   Essential hypertension, benign 06/29/2011    PCP: Dr Luetta Nutting  REFERRING PROVIDER: Dr Melrose Nakayama  REFERRING DIAG: Lt TKA  THERAPY DIAG:  Chronic pain of right knee  Abnormal gait  Muscle weakness (generalized)  Stiffness of hip joint, right  Other symptoms and signs involving the musculoskeletal system  Localized edema  Abnormal posture  Rationale for Evaluation and Treatment Rehabilitation  ONSET DATE: 09/20/22  SUBJECTIVE:   SUBJECTIVE STATEMENT: Patient reports Rt knee pain over the past 2 or more years. He has tried shots but that didn't work.   PERTINENT HISTORY: Rt THA 5/23; HTN; arthritis  PAIN:  Are you having pain? Yes:  NPRS scale: 8/10 Pain location: Rt knee  Pain description: sharp; aching; tight; throbbing  Aggravating factors: movement Relieving factors: meds and elevation   PRECAUTIONS: Other: post TKA 09/20/22  WEIGHT BEARING RESTRICTIONS No  FALLS:  Has patient fallen  in last 6 months? No  LIVING ENVIRONMENT: Lives with: lives with their family and lives with their spouse Lives in: House/apartment Stairs: Yes: Internal: 13 steps; on right going up and External: 2 steps; Rt Has following equipment at home: Single point cane, Quad cane small base, Walker - 2 wheeled, shower chair, bed side commode, and Grab bars  OCCUPATION: retired  PLOF: Independent  PATIENT GOALS use the [name] again and get around without walker or  cane   OBJECTIVE:   DIAGNOSTIC FINDINGS: xrays - Lt knee bone on bone   PATIENT SURVEYS:  FOTO 8  COGNITION:  Overall cognitive status: Within functional limits for tasks assessed     SENSATION: WFL  EDEMA:  Edema Lt LE   MUSCLE LENGTH: Hamstrings: tight bilat Lt > Rt  Hip flexors: tight bilat Lt > Rt   POSTURE: rounded shoulders, forward head, decreased lumbar lordosis, increased thoracic kyphosis, flexed trunk , and weight shift right  PALPATION:   LOWER EXTREMITY ROM:  Active Assistive ROM Right eval Left eval  Hip flexion 120 110  Hip extension 5 0  Hip abduction 26 32  Hip adduction    Hip internal rotation    Hip external rotation    Knee flexion 110 63  Knee extension 0 -14  Ankle dorsiflexion    Ankle plantarflexion    Ankle inversion    Ankle eversion     (Blank rows = not tested)  LOWER EXTREMITY MMT:  MMT Right eval Left eval  Hip flexion    Hip extension    Hip abduction    Hip adduction    Hip internal rotation    Hip external rotation    Knee flexion    Knee extension    Ankle dorsiflexion    Ankle plantarflexion    Ankle inversion    Ankle eversion     (Blank rows = not tested)   GAIT: Distance walked: 40 Assistive device utilized: Environmental consultant - 2 wheeled Level of assistance: SBA Comments: limited Lt knee flexion and extension; wt shifted to the Rt     TODAY'S TREATMENT: Quad set 5 sec x 10 pillow under knee Quad set 5 sec x 5 heel propped  Assisted SLR 5 sec x 10 Assisted hip abduction 3 sec x 10 Heel slide with strap 10 sec x 5  Ankle pumps and circles x 20  Modalities:  Vaso low pressure 34 deg x 15 min    PATIENT EDUCATION:  Education details: POC HEP Person educated: Patient Education method: Consulting civil engineer, Media planner, Corporate treasurer cues, Verbal cues, and Handouts Education comprehension: verbalized understanding, returned demonstration, verbal cues required, tactile cues required, and needs further  education   HOME EXERCISE PROGRAM: Access Code: [email or web address] URL: [email or web address] Date: 09/22/2022 Prepared by: Gillermo Murdoch  Exercises - Ankle Pumps in Elevation  - 2 x daily - 7 x weekly - 1 sets - 10-20 reps - Ankle Circles in Elevation  - 2 x daily - 7 x weekly - 1 sets - 10-15 reps - Supine Quad Set  - 2 x daily - 7 x weekly - 1 sets - 10 reps - 3 sec  hold - Small Range Straight Leg Raise  - 2 x daily - 7 x weekly - 1 sets - 10 reps - 5 sec  hold - Supine Hip Abduction  - 2 x daily - 7 x weekly - 1 sets - 10 reps - 3 sec  hold - Supine Heel Slide with Strap  -  2 x daily - 7 x weekly - 1 sets - 5-10 reps - 10 sec  hold - Seated Heel Slide  - 2 x daily - 7 x weekly - 1 sets - 5-10 reps - 10 sec  hold  ASSESSMENT:  CLINICAL IMPRESSION: Patient is a 71 y.o. male who was seen today for physical therapy evaluation and treatment for s/p Lt TKA.    OBJECTIVE IMPAIRMENTS Abnormal gait, decreased activity tolerance, decreased balance, decreased mobility, difficulty walking, decreased ROM, decreased strength, increased edema, impaired flexibility, postural dysfunction, and pain.   ACTIVITY LIMITATIONS carrying, lifting, bending, sitting, standing, squatting, sleeping, stairs, transfers, bed mobility, bathing, toileting, dressing, and locomotion level  PARTICIPATION LIMITATIONS: meal prep, cleaning, laundry, driving, shopping, community activity, occupation, and yard work  PERSONAL FACTORS Past/current experiences, Time since onset of injury/illness/exacerbation, and comorbidities: arthritis; Rt THA;  arthritis; HTN are also affecting patient's functional outcome.   REHAB POTENTIAL: Good  CLINICAL DECISION MAKING: Stable/uncomplicated  EVALUATION COMPLEXITY: Low   GOALS: Goals reviewed with patient? Yes  SHORT TERM GOALS: Target date: 10/20/2022  Independent in initial HEP  Baseline: Goal status: INITIAL  2.  Increase AAROM Lt knee to (-)8 to (-)10 ext and 90 to  95 deg flexion  Baseline:  Goal status: INITIAL  3.  Independent in gait with rolling walker improved gait pattern and increased wt bearing Lt LE  Baseline:  Goal status: INITIAL    LONG TERM GOALS: Target date: 11/17/2022   Increased AROM Lt knee to 0 deg extension and 100 degrees flexion Baseline:  Goal status: INITIAL  2.  Increase strength Lt hip and knee to 4+/5 to 5/5  Baseline:  Goal status: INITIAL  3.  Gait with least restrictive assistive device for community distance of at least 400-600 ft Baseline:  Goal status: INITIAL  4.  Independent in all transfers in home  Baseline:  Goal status: INITIAL  5.  Independent in ascending and descending 14 steps in home Baseline:  Goal status: INITIAL  6.  Independent in HEP  Baseline:  Goal status: INITIAL  7. Improve functional limitation score to 45  Goal status: INITIAL     PLAN: PT FREQUENCY: 2x/week  PT DURATION: 8 weeks  PLANNED INTERVENTIONS: Therapeutic exercises, Therapeutic activity, Neuromuscular re-education, Balance training, Gait training, Patient/Family education, Self Care, Joint mobilization, Stair training, DME instructions, Aquatic Therapy, Dry Needling, Electrical stimulation, Cryotherapy, Moist heat, Taping, Vasopneumatic device, Ultrasound, Ionotophoresis '4mg'$ /ml Dexamethasone, Manual therapy, and Re-evaluation  PLAN FOR NEXT SESSION: Review and progress HEP; ROM and strength; gait training    [place], PT, MPH 09/22/2022, 11:50 AM

## 2022-09-27 ENCOUNTER — Encounter: Payer: Self-pay | Admitting: Rehabilitative and Restorative Service Providers"

## 2022-09-27 ENCOUNTER — Ambulatory Visit: Payer: Medicare Other | Admitting: Rehabilitative and Restorative Service Providers"

## 2022-09-27 DIAGNOSIS — M6281 Muscle weakness (generalized): Secondary | ICD-10-CM

## 2022-09-27 DIAGNOSIS — R293 Abnormal posture: Secondary | ICD-10-CM

## 2022-09-27 DIAGNOSIS — G8929 Other chronic pain: Secondary | ICD-10-CM

## 2022-09-27 DIAGNOSIS — R29898 Other symptoms and signs involving the musculoskeletal system: Secondary | ICD-10-CM | POA: Diagnosis not present

## 2022-09-27 DIAGNOSIS — Z96659 Presence of unspecified artificial knee joint: Secondary | ICD-10-CM | POA: Diagnosis not present

## 2022-09-27 DIAGNOSIS — M25651 Stiffness of right hip, not elsewhere classified: Secondary | ICD-10-CM | POA: Diagnosis not present

## 2022-09-27 DIAGNOSIS — R600 Localized edema: Secondary | ICD-10-CM

## 2022-09-27 DIAGNOSIS — M25561 Pain in right knee: Secondary | ICD-10-CM | POA: Diagnosis not present

## 2022-09-27 DIAGNOSIS — R269 Unspecified abnormalities of gait and mobility: Secondary | ICD-10-CM

## 2022-09-27 DIAGNOSIS — M25662 Stiffness of left knee, not elsewhere classified: Secondary | ICD-10-CM

## 2022-09-27 NOTE — Therapy (Signed)
OUTPATIENT PHYSICAL THERAPY LOWER EXTREMITY TREATMENT   Patient Name: Erik Estrada MRN: [id number] DOB:1951-06-02, 71 y.o., male Today's Date: 09/27/2022   PT End of Session - 09/27/22 1108     Visit Number 2    Number of Visits 16    Date for PT Re-Evaluation 11/17/22    Authorization Type Medicare    Authorization - Visit Number 2    Progress Note Due on Visit 10    PT Start Time 6314    PT Stop Time 9702    PT Time Calculation (min) 54 min             Past Medical History:  Diagnosis Date   Anxiety    Arthritis    Essential hypertension, benign 06/29/2011   Hemorrhoids 08/01/2018   History of kidney stones    Morbid obesity ([place]) 10/12/2018   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  Plan for 5-year recheck.   Vitamin D deficiency 08/01/2017   Past Surgical History:  Procedure Laterality Date   KIDNEY STONE SURGERY     SHOULDER SURGERY  December 2010   Left   TOTAL HIP ARTHROPLASTY Right 04/19/2022   Procedure: RIGHT TOTAL HIP ARTHROPLASTY ANTERIOR APPROACH;  Surgeon: Melrose Nakayama, MD;  Location: [facility];  Service: Orthopedics;  Laterality: Right;   TOTAL KNEE ARTHROPLASTY Left 09/20/2022   Procedure: LEFT TOTAL KNEE ARTHROPLASTY;  Surgeon: Melrose Nakayama, MD;  Location: [facility];  Service: Orthopedics;  Laterality: Left;   UMBILICAL HERNIA REPAIR     Patient Active Problem List   Diagnosis Date Noted   COVID-19 08/09/2022   Back pain 06/23/2022   Urinary retention 06/23/2022   Allergic sinusitis 04/12/2022   Rotator cuff tendinitis, right 02/03/2022   Primary osteoarthritis of right hip 06/24/2021   Primary osteoarthritis of left knee 03/29/2021   Hamstring strain, left, initial encounter 02/26/2021   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 01/29/2021   OA (osteoarthritis) of knee  08/16/2020   Neoplasm of uncertain behavior of skin 03/12/2020   Tubular adenoma of colon 10/17/2018   Hyperlipidemia 10/12/2018   Greater trochanteric bursitis of both hips 10/12/2018   Morbid obesity ([place]) 10/12/2018   Vitamin D deficiency 08/01/2017   Neurotic excoriations 07/27/2016   Family history of colon cancer requiring screening colonoscopy 05/16/2012   Prediabetes 05/16/2012   Tobacco abuse 07/20/2011   Hypogonadism male 07/20/2011   Essential hypertension, benign 06/29/2011    PCP: Dr Luetta Nutting  REFERRING PROVIDER: Dr Melrose Nakayama  REFERRING DIAG: Lt TKA  THERAPY DIAG:  Chronic pain of left knee  Abnormal gait  Muscle weakness (generalized)  Other symptoms and signs involving the musculoskeletal system  Localized edema  Abnormal posture  Stiffness of left knee, not elsewhere classified  Rationale for Evaluation and Treatment Rehabilitation  ONSET DATE: 09/20/22  SUBJECTIVE:   SUBJECTIVE STATEMENT: Patient reports he continues to have pain in the Rt knee. He has been working on his exercises at home.     PERTINENT HISTORY: Rt THA 5/23; HTN; arthritis  PAIN:  09/27/22 Are you having pain? Yes:  NPRS scale: 8/10 Pain location: Rt knee  Pain description: sharp; aching; tight; throbbing  Aggravating factors: movement Relieving factors: meds and elevation   PRECAUTIONS: Other: post TKA 09/20/22  WEIGHT BEARING RESTRICTIONS No  FALLS:  Has patient fallen in last 6 months?  No  LIVING ENVIRONMENT: Lives with: lives with their family and lives with their spouse Lives in: House/apartment Stairs: Yes: Internal: 13 steps; on right going up and External: 2 steps; Rt Has following equipment at home: Single point cane, Quad cane small base, Walker - 2 wheeled, shower chair, bed side commode, and Grab bars  OCCUPATION: retired  PLOF: Independent  PATIENT GOALS use the [name] again and get around without walker or cane   OBJECTIVE:    DIAGNOSTIC FINDINGS: xrays - Lt knee bone on bone   PATIENT SURVEYS:  FOTO 8  COGNITION:  Overall cognitive status: Within functional limits for tasks assessed     SENSATION: WFL  EDEMA:  Edema Lt LE   MUSCLE LENGTH: Hamstrings: tight bilat Lt > Rt  Hip flexors: tight bilat Lt > Rt   POSTURE: rounded shoulders, forward head, decreased lumbar lordosis, increased thoracic kyphosis, flexed trunk , and weight shift right  PALPATION:   LOWER EXTREMITY ROM:  Active Assistive ROM Right eval Left eval  Hip flexion 120 110  Hip extension 5 0  Hip abduction 26 32  Hip adduction    Hip internal rotation    Hip external rotation    Knee flexion 110 63  Knee extension 0 -14  Ankle dorsiflexion    Ankle plantarflexion    Ankle inversion    Ankle eversion     (Blank rows = not tested)  LOWER EXTREMITY MMT:  MMT Right eval Left eval  Hip flexion    Hip extension    Hip abduction    Hip adduction    Hip internal rotation    Hip external rotation    Knee flexion    Knee extension    Ankle dorsiflexion    Ankle plantarflexion    Ankle inversion    Ankle eversion     (Blank rows = not tested)   GAIT: Distance walked: 40 Assistive device utilized: Environmental consultant - 2 wheeled Level of assistance: SBA Comments: limited Lt knee flexion and extension; wt shifted to the Rt     TODAY'S TREATMENT: 09/27/22:  Nustep L6 x 10 min  Sit to stand from nustep seat x 10 w/ UE assist Hamstring stretch supine w/ strap 30 x 4 reps  Quad set 10 sec x 10 towel under knee  SAQ w bolster pt using strap with assist x 5  SAQ w/ PT assist lift; iso hold; eccentric lowering 5 Quad set with heel on bolster 10 sec hold x 5 SL hip abduction 5 sec x 10 Prone glut set 10 sec x 15 Prone Lt LE extension 3 sec x 10  Supine heel slide with strap 5 sec x 10  Supine LE's on orange ball knee flex 5 sec x 10  Modalities: Vaso low pressure x 15 min    Eval: Quad set 5 sec x 10 pillow under  knee Quad set 5 sec x 5 heel propped  Assisted SLR 5 sec x 10 Assisted hip abduction 3 sec x 10 Heel slide with strap 10 sec x 5  Ankle pumps and circles x 20  Modalities:  Vaso low pressure 34 deg x 15 min    PATIENT EDUCATION:  Education details: POC HEP Person educated: Patient Education method: Consulting civil engineer, Media planner, Corporate treasurer cues, Verbal cues, and Handouts Education comprehension: verbalized understanding, returned demonstration, verbal cues required, tactile cues required, and needs further education   HOME EXERCISE PROGRAM: Access Code: [email or web address] URL: [email or web address] Date: 09/22/2022 Prepared by: Gillermo Murdoch  Exercises - Ankle Pumps in Elevation  - 2 x daily - 7 x weekly - 1 sets - 10-20 reps - Ankle Circles in Elevation  - 2 x daily - 7 x weekly - 1 sets - 10-15 reps - Supine Quad Set  - 2 x daily - 7 x weekly - 1 sets - 10 reps - 3 sec  hold - Small Range Straight Leg Raise  - 2 x daily - 7 x weekly - 1 sets - 10 reps - 5 sec  hold - Supine Hip Abduction  - 2 x daily - 7 x weekly - 1 sets - 10 reps - 3 sec  hold - Supine Heel Slide with Strap  - 2 x daily - 7 x weekly - 1 sets - 5-10 reps - 10 sec  hold - Seated Heel Slide  - 2 x daily - 7 x weekly - 1 sets - 5-10 reps - 10 sec  hold  ASSESSMENT:  CLINICAL IMPRESSION: !0/17/23: Patient working on exercises at home with his wife. He is is going up stairs at home. Will benefit from practice of stairs in clinic. Patient will bring cane next visit. Progressing well with exercises/ROM.   Eval: Patient is a 71 y.o. male who was seen today for physical therapy evaluation and treatment for s/p Lt TKA.    OBJECTIVE IMPAIRMENTS Abnormal gait, decreased activity tolerance, decreased balance, decreased mobility, difficulty walking, decreased ROM, decreased strength, increased edema, impaired flexibility, postural dysfunction, and pain.   ACTIVITY LIMITATIONS carrying, lifting, bending, sitting, standing,  squatting, sleeping, stairs, transfers, bed mobility, bathing, toileting, dressing, and locomotion level  PARTICIPATION LIMITATIONS: meal prep, cleaning, laundry, driving, shopping, community activity, occupation, and yard work  PERSONAL FACTORS Past/current experiences, Time since onset of injury/illness/exacerbation, and comorbidities: arthritis; Rt THA;  arthritis; HTN are also affecting patient's functional outcome.   REHAB POTENTIAL: Good  CLINICAL DECISION MAKING: Stable/uncomplicated  EVALUATION COMPLEXITY: Low   GOALS: Goals reviewed with patient? Yes  SHORT TERM GOALS: Target date: 10/20/2022  Independent in initial HEP  Baseline: Goal status: INITIAL  2.  Increase AAROM Lt knee to (-)8 to (-)10 ext and 90 to 95 deg flexion  Baseline:  Goal status: INITIAL  3.  Independent in gait with rolling walker improved gait pattern and increased wt bearing Lt LE  Baseline:  Goal status: INITIAL    LONG TERM GOALS: Target date: 11/17/2022   Increased AROM Lt knee to 0 deg extension and 100 degrees flexion Baseline:  Goal status: INITIAL  2.  Increase strength Lt hip and knee to 4+/5 to 5/5  Baseline:  Goal status: INITIAL  3.  Gait with least restrictive assistive device for community distance of at least 400-600 ft Baseline:  Goal status: INITIAL  4.  Independent in all transfers in home  Baseline:  Goal status: INITIAL  5.  Independent in ascending and descending 14 steps in home Baseline:  Goal status: INITIAL  6.  Independent in HEP  Baseline:  Goal status: INITIAL  7. Improve functional limitation score to 45  Goal status: INITIAL     PLAN: PT FREQUENCY: 2x/week  PT DURATION: 8 weeks  PLANNED INTERVENTIONS: Therapeutic exercises, Therapeutic activity, Neuromuscular re-education, Balance training, Gait training, Patient/Family education, Self Care, Joint mobilization, Stair training, DME instructions, Aquatic Therapy, Dry Needling, Electrical  stimulation, Cryotherapy, Moist heat, Taping, Vasopneumatic device, Ultrasound, Ionotophoresis '4mg'$ /ml Dexamethasone, Manual therapy, and Re-evaluation  PLAN FOR NEXT SESSION: Review and progress HEP; ROM and strength; gait  training. Work on stairs at next visit. Patient will bring cane.    Everardo All, PT, MPH 09/27/2022, 1:16 PM

## 2022-09-28 NOTE — Therapy (Signed)
OUTPATIENT PHYSICAL THERAPY LOWER EXTREMITY TREATMENT   Patient Name: Erik Estrada MRN: [id number] DOB:25-Jan-1951, 71 y.o., male Today's Date: 09/29/2022   PT End of Session - 09/29/22 0932     Visit Number 3    Number of Visits 16    Date for PT Re-Evaluation 11/17/22    Authorization Type Medicare    Authorization - Visit Number 3    Progress Note Due on Visit 10    PT Start Time 0932    PT Stop Time 1011    PT Time Calculation (min) 39 min    Activity Tolerance Patient tolerated treatment well    Behavior During Therapy [facility] for tasks assessed/performed              Past Medical History:  Diagnosis Date   Anxiety    Arthritis    Essential hypertension, benign 06/29/2011   Hemorrhoids 08/01/2018   History of kidney stones    Morbid obesity ([place]) 10/12/2018   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  Plan for 5-year recheck.   Vitamin D deficiency 08/01/2017   Past Surgical History:  Procedure Laterality Date   KIDNEY STONE SURGERY     SHOULDER SURGERY  December 2010   Left   TOTAL HIP ARTHROPLASTY Right 04/19/2022   Procedure: RIGHT TOTAL HIP ARTHROPLASTY ANTERIOR APPROACH;  Surgeon: Melrose Nakayama, MD;  Location: [facility];  Service: Orthopedics;  Laterality: Right;   TOTAL KNEE ARTHROPLASTY Left 09/20/2022   Procedure: LEFT TOTAL KNEE ARTHROPLASTY;  Surgeon: Melrose Nakayama, MD;  Location: [facility];  Service: Orthopedics;  Laterality: Left;   UMBILICAL HERNIA REPAIR     Patient Active Problem List   Diagnosis Date Noted   COVID-19 08/09/2022   Back pain 06/23/2022   Urinary retention 06/23/2022   Allergic sinusitis 04/12/2022   Rotator cuff tendinitis, right 02/03/2022   Primary osteoarthritis of right hip 06/24/2021   Primary osteoarthritis of left knee 03/29/2021   Hamstring strain, left, initial encounter 02/26/2021    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 01/29/2021   OA (osteoarthritis) of knee 08/16/2020   Neoplasm of uncertain behavior of skin 03/12/2020   Tubular adenoma of colon 10/17/2018   Hyperlipidemia 10/12/2018   Greater trochanteric bursitis of both hips 10/12/2018   Morbid obesity ([place]) 10/12/2018   Vitamin D deficiency 08/01/2017   Neurotic excoriations 07/27/2016   Family history of colon cancer requiring screening colonoscopy 05/16/2012   Prediabetes 05/16/2012   Tobacco abuse 07/20/2011   Hypogonadism male 07/20/2011   Essential hypertension, benign 06/29/2011    PCP: Dr Luetta Nutting  REFERRING PROVIDER: Dr Melrose Nakayama  REFERRING DIAG: Lt TKA  THERAPY DIAG:  Chronic pain of left knee  Abnormal gait  Muscle weakness (generalized)  Other symptoms and signs involving the musculoskeletal system  Localized edema  Abnormal posture  Stiffness of left knee, not elsewhere classified  Rationale for Evaluation and Treatment Rehabilitation  ONSET DATE: 09/20/22  SUBJECTIVE:   SUBJECTIVE STATEMENT: Pt arrives and states he is more painful today, cannot attribute it to anything in particular although he states he didn't do much yesterday and it may have gotten more stiff. States he felt good after last session, requests new HEP sheet.   PERTINENT HISTORY: Rt THA 5/23; HTN; arthritis  PAIN:   09/29/22 Are you having pain? Yes:  NPRS scale: 8/10 Pain  location: Rt knee  Pain description: sharp; aching; tight; throbbing  Aggravating factors: movement Relieving factors: meds and elevation   PRECAUTIONS: Other: post TKA 09/20/22  WEIGHT BEARING RESTRICTIONS No  FALLS:  Has patient fallen in last 6 months? No  LIVING ENVIRONMENT: Lives with: lives with their family and lives with their spouse Lives in: House/apartment Stairs: Yes: Internal: 13 steps; on right going up and External: 2 steps; Rt Has following equipment at home:  Single point cane, Quad cane small base, Walker - 2 wheeled, shower chair, bed side commode, and Grab bars  OCCUPATION: retired  PLOF: Independent  PATIENT GOALS use the [name] again and get around without walker or cane   OBJECTIVE:   DIAGNOSTIC FINDINGS: xrays - Lt knee bone on bone   PATIENT SURVEYS:  FOTO 8  COGNITION:  Overall cognitive status: Within functional limits for tasks assessed     SENSATION: WFL  EDEMA:  Edema Lt LE   MUSCLE LENGTH: Hamstrings: tight bilat Lt > Rt  Hip flexors: tight bilat Lt > Rt   POSTURE: rounded shoulders, forward head, decreased lumbar lordosis, increased thoracic kyphosis, flexed trunk , and weight shift right  PALPATION:   LOWER EXTREMITY ROM:  Active Assistive ROM Right eval Left eval  Hip flexion 120 110  Hip extension 5 0  Hip abduction 26 32  Hip adduction    Hip internal rotation    Hip external rotation    Knee flexion 110 63  Knee extension 0 -14  Ankle dorsiflexion    Ankle plantarflexion    Ankle inversion    Ankle eversion     (Blank rows = not tested)  LOWER EXTREMITY MMT:  MMT Right eval Left eval  Hip flexion    Hip extension    Hip abduction    Hip adduction    Hip internal rotation    Hip external rotation    Knee flexion    Knee extension    Ankle dorsiflexion    Ankle plantarflexion    Ankle inversion    Ankle eversion     (Blank rows = not tested)   GAIT: Distance walked: 40 Assistive device utilized: Environmental consultant - 2 wheeled Level of assistance: SBA Comments: limited Lt knee flexion and extension; wt shifted to the Rt     TODAY'S TREATMENT: [facility] Adult PT Treatment:                                                DATE: 09/29/22 Therapeutic Exercise: Nustep x46mn Seated heel slides x10 w strap, 2-3sec hold, cues for ROM STS from standard chair x12 w RW, cues for hand placement and mechanics Neuromuscular re-ed: Quad set 10sec x10 towel under ankle for inc quad activation,  tactile/verbal cues as needed   SAQ bolster, x15 tactile/verbal cues as needed , strap assisted 4 inch fwd weight shift for closed chain LE activation x12, BUE support  09/27/22:  Nustep L6 x 10 min  Sit to stand from nustep seat x 10 w/ UE assist Hamstring stretch supine w/ strap 30 x 4 reps  Quad set 10 sec x 10 towel under knee  SAQ w bolster pt using strap with assist x 5  SAQ w/ PT assist lift; iso hold; eccentric lowering 5 Quad set with heel on bolster 10 sec hold x 5 SL hip abduction 5  sec x 10 Prone glut set 10 sec x 15 Prone Lt LE extension 3 sec x 10  Supine heel slide with strap 5 sec x 10  Supine LE's on orange ball knee flex 5 sec x 10  Modalities: Vaso low pressure x 15 min    Eval: Quad set 5 sec x 10 pillow under knee Quad set 5 sec x 5 heel propped  Assisted SLR 5 sec x 10 Assisted hip abduction 3 sec x 10 Heel slide with strap 10 sec x 5  Ankle pumps and circles x 20  Modalities:  Vaso low pressure 34 deg x 15 min    PATIENT EDUCATION:  Education details: rationale for interventions, re-printed familiar HEP, verbally reviewed Person educated: Patient Education method: Explanation, Demonstration, Tactile cues, Verbal cues, handouts Education comprehension: verbalized understanding, returned demonstration, verbal cues required, tactile cues required, and needs further education   HOME EXERCISE PROGRAM: Access Code: [email or web address] URL: [email or web address] Date: 09/22/2022 Prepared by: Gillermo Murdoch  Exercises - Ankle Pumps in Elevation  - 2 x daily - 7 x weekly - 1 sets - 10-20 reps - Ankle Circles in Elevation  - 2 x daily - 7 x weekly - 1 sets - 10-15 reps - Supine Quad Set  - 2 x daily - 7 x weekly - 1 sets - 10 reps - 3 sec  hold - Small Range Straight Leg Raise  - 2 x daily - 7 x weekly - 1 sets - 10 reps - 5 sec  hold - Supine Hip Abduction  - 2 x daily - 7 x weekly - 1 sets - 10 reps - 3 sec  hold - Supine Heel Slide with Strap  - 2 x  daily - 7 x weekly - 1 sets - 5-10 reps - 10 sec  hold - Seated Heel Slide  - 2 x daily - 7 x weekly - 1 sets - 5-10 reps - 10 sec  hold  ASSESSMENT:  CLINICAL IMPRESSION: 09/29/22: Pt arrives with 8/10 pain, states he felt good after last session. Pt reports good compliance with HEP but requests new handout. Today's session emphasizing familiar program with addition of stair weight shifts to improve closed chain LE stability. Cues as needed for pacing/form, increased time/rest breaks required for most activities. Limited by pain/fatigue but reports gradual improvement in symptoms as session progresses (6/10 compared to 8/10 on arrival), no adverse events noted. Pt departs today's session in no acute distress, all voiced questions/concerns addressed appropriately from PT perspective.    !0/17/23: Patient working on exercises at home with his wife. He is is going up stairs at home. Will benefit from practice of stairs in clinic. Patient will bring cane next visit. Progressing well with exercises/ROM.   Eval: Patient is a 71 y.o. male who was seen today for physical therapy evaluation and treatment for s/p Lt TKA.    OBJECTIVE IMPAIRMENTS Abnormal gait, decreased activity tolerance, decreased balance, decreased mobility, difficulty walking, decreased ROM, decreased strength, increased edema, impaired flexibility, postural dysfunction, and pain.   ACTIVITY LIMITATIONS carrying, lifting, bending, sitting, standing, squatting, sleeping, stairs, transfers, bed mobility, bathing, toileting, dressing, and locomotion level  PARTICIPATION LIMITATIONS: meal prep, cleaning, laundry, driving, shopping, community activity, occupation, and yard work  PERSONAL FACTORS Past/current experiences, Time since onset of injury/illness/exacerbation, and comorbidities: arthritis; Rt THA;  arthritis; HTN are also affecting patient's functional outcome.   REHAB POTENTIAL: Good  CLINICAL DECISION MAKING:  Stable/uncomplicated  EVALUATION COMPLEXITY: Low   GOALS: Goals  reviewed with patient? Yes  SHORT TERM GOALS: Target date: 10/20/2022  Independent in initial HEP  Baseline: Goal status: INITIAL  2.  Increase AAROM Lt knee to (-)8 to (-)10 ext and 90 to 95 deg flexion  Baseline:  Goal status: INITIAL  3.  Independent in gait with rolling walker improved gait pattern and increased wt bearing Lt LE  Baseline:  Goal status: INITIAL    LONG TERM GOALS: Target date: 11/17/2022   Increased AROM Lt knee to 0 deg extension and 100 degrees flexion Baseline:  Goal status: INITIAL  2.  Increase strength Lt hip and knee to 4+/5 to 5/5  Baseline:  Goal status: INITIAL  3.  Gait with least restrictive assistive device for community distance of at least 400-600 ft Baseline:  Goal status: INITIAL  4.  Independent in all transfers in home  Baseline:  Goal status: INITIAL  5.  Independent in ascending and descending 14 steps in home Baseline:  Goal status: INITIAL  6.  Independent in HEP  Baseline:  Goal status: INITIAL  7. Improve functional limitation score to 45  Goal status: INITIAL     PLAN: PT FREQUENCY: 2x/week  PT DURATION: 8 weeks  PLANNED INTERVENTIONS: Therapeutic exercises, Therapeutic activity, Neuromuscular re-education, Balance training, Gait training, Patient/Family education, Self Care, Joint mobilization, Stair training, DME instructions, Aquatic Therapy, Dry Needling, Electrical stimulation, Cryotherapy, Moist heat, Taping, Vasopneumatic device, Ultrasound, Ionotophoresis '4mg'$ /ml Dexamethasone, Manual therapy, and Re-evaluation  PLAN FOR NEXT SESSION: continue to progress knee ext/flex as able/appropriate.    Leeroy Cha PT, DPT 09/29/2022 10:15 AM

## 2022-09-29 ENCOUNTER — Encounter: Payer: Self-pay | Admitting: Physical Therapy

## 2022-09-29 ENCOUNTER — Ambulatory Visit: Payer: Medicare Other | Admitting: Physical Therapy

## 2022-09-29 DIAGNOSIS — Z96659 Presence of unspecified artificial knee joint: Secondary | ICD-10-CM | POA: Diagnosis not present

## 2022-09-29 DIAGNOSIS — R29898 Other symptoms and signs involving the musculoskeletal system: Secondary | ICD-10-CM | POA: Diagnosis not present

## 2022-09-29 DIAGNOSIS — R600 Localized edema: Secondary | ICD-10-CM

## 2022-09-29 DIAGNOSIS — G8929 Other chronic pain: Secondary | ICD-10-CM

## 2022-09-29 DIAGNOSIS — R269 Unspecified abnormalities of gait and mobility: Secondary | ICD-10-CM

## 2022-09-29 DIAGNOSIS — M25651 Stiffness of right hip, not elsewhere classified: Secondary | ICD-10-CM | POA: Diagnosis not present

## 2022-09-29 DIAGNOSIS — M6281 Muscle weakness (generalized): Secondary | ICD-10-CM | POA: Diagnosis not present

## 2022-09-29 DIAGNOSIS — M25561 Pain in right knee: Secondary | ICD-10-CM | POA: Diagnosis not present

## 2022-09-29 DIAGNOSIS — M25662 Stiffness of left knee, not elsewhere classified: Secondary | ICD-10-CM

## 2022-09-29 DIAGNOSIS — R293 Abnormal posture: Secondary | ICD-10-CM

## 2022-09-30 DIAGNOSIS — M1712 Unilateral primary osteoarthritis, left knee: Secondary | ICD-10-CM | POA: Diagnosis not present

## 2022-10-03 ENCOUNTER — Encounter: Payer: Self-pay | Admitting: Rehabilitative and Restorative Service Providers"

## 2022-10-03 ENCOUNTER — Ambulatory Visit: Payer: Medicare Other | Admitting: Rehabilitative and Restorative Service Providers"

## 2022-10-03 DIAGNOSIS — M25561 Pain in right knee: Secondary | ICD-10-CM | POA: Diagnosis not present

## 2022-10-03 DIAGNOSIS — R269 Unspecified abnormalities of gait and mobility: Secondary | ICD-10-CM

## 2022-10-03 DIAGNOSIS — M6281 Muscle weakness (generalized): Secondary | ICD-10-CM | POA: Diagnosis not present

## 2022-10-03 DIAGNOSIS — G8929 Other chronic pain: Secondary | ICD-10-CM | POA: Diagnosis not present

## 2022-10-03 DIAGNOSIS — Z96659 Presence of unspecified artificial knee joint: Secondary | ICD-10-CM | POA: Diagnosis not present

## 2022-10-03 DIAGNOSIS — R29898 Other symptoms and signs involving the musculoskeletal system: Secondary | ICD-10-CM | POA: Diagnosis not present

## 2022-10-03 DIAGNOSIS — R600 Localized edema: Secondary | ICD-10-CM

## 2022-10-03 DIAGNOSIS — M25651 Stiffness of right hip, not elsewhere classified: Secondary | ICD-10-CM | POA: Diagnosis not present

## 2022-10-03 NOTE — Therapy (Signed)
OUTPATIENT PHYSICAL THERAPY LOWER EXTREMITY TREATMENT   Patient Name: Erik Estrada MRN: [id number] DOB:07/07/1951, 71 y.o., male Today's Date: 10/03/2022   PT End of Session - 10/03/22 1059     Visit Number 4    Number of Visits 16    Date for PT Re-Evaluation 11/17/22    Authorization Type Medicare    Authorization - Visit Number 4    Progress Note Due on Visit 10    PT Start Time 1100    PT Stop Time 4627    PT Time Calculation (min) 44 min    Activity Tolerance Patient tolerated treatment well    Behavior During Therapy [facility] for tasks assessed/performed               Past Medical History:  Diagnosis Date   Anxiety    Arthritis    Essential hypertension, benign 06/29/2011   Hemorrhoids 08/01/2018   History of kidney stones    Morbid obesity ([place]) 10/12/2018   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  Plan for 5-year recheck.   Vitamin D deficiency 08/01/2017   Past Surgical History:  Procedure Laterality Date   KIDNEY STONE SURGERY     SHOULDER SURGERY  December 2010   Left   TOTAL HIP ARTHROPLASTY Right 04/19/2022   Procedure: RIGHT TOTAL HIP ARTHROPLASTY ANTERIOR APPROACH;  Surgeon: Melrose Nakayama, MD;  Location: [facility];  Service: Orthopedics;  Laterality: Right;   TOTAL KNEE ARTHROPLASTY Left 09/20/2022   Procedure: LEFT TOTAL KNEE ARTHROPLASTY;  Surgeon: Melrose Nakayama, MD;  Location: [facility];  Service: Orthopedics;  Laterality: Left;   UMBILICAL HERNIA REPAIR     Patient Active Problem List   Diagnosis Date Noted   COVID-19 08/09/2022   Back pain 06/23/2022   Urinary retention 06/23/2022   Allergic sinusitis 04/12/2022   Rotator cuff tendinitis, right 02/03/2022   Primary osteoarthritis of right hip 06/24/2021   Primary osteoarthritis of left knee 03/29/2021   Hamstring strain, left, initial encounter 02/26/2021    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 01/29/2021   OA (osteoarthritis) of knee 08/16/2020   Neoplasm of uncertain behavior of skin 03/12/2020   Tubular adenoma of colon 10/17/2018   Hyperlipidemia 10/12/2018   Greater trochanteric bursitis of both hips 10/12/2018   Morbid obesity ([place]) 10/12/2018   Vitamin D deficiency 08/01/2017   Neurotic excoriations 07/27/2016   Family history of colon cancer requiring screening colonoscopy 05/16/2012   Prediabetes 05/16/2012   Tobacco abuse 07/20/2011   Hypogonadism male 07/20/2011   Essential hypertension, benign 06/29/2011    PCP: Dr Luetta Nutting  REFERRING PROVIDER: Dr Melrose Nakayama  REFERRING DIAG: Lt TKA  THERAPY DIAG:  Chronic pain of left knee  Abnormal gait  Muscle weakness (generalized)  Localized edema  Rationale for Evaluation and Treatment Rehabilitation  ONSET DATE: 09/20/22  SUBJECTIVE:   SUBJECTIVE STATEMENT: The patient reports he is tired at home from using pain meds.  His wife gave him tylenol before therapy today.  He notes a general grogginess associated with pain med use.  He has increased bruising in his distal L LE and denies calf pain.  PERTINENT HISTORY: L TKA 09/20/22 Rt THA 5/23; HTN; arthritis  PAIN:   09/29/22 Are you having pain? Yes:  NPRS scale:4/10 Pain location: Lt knee  Pain description: sharp; aching; tight; throbbing  Aggravating factors: movement  Relieving factors: meds and elevation   PRECAUTIONS: Other: post L TKA 09/20/22  WEIGHT BEARING RESTRICTIONS No  FALLS:  Has patient fallen in last 6 months? No  PATIENT GOALS use the [name] again and get around without walker or cane   OBJECTIVE:   LOWER EXTREMITY ROM:  Active Assistive ROM Right eval Left eval Left 10/03/22  Hip flexion 120 110   Hip extension 5 0   Hip abduction 26 32   Hip adduction     Hip internal rotation     Hip external rotation     Knee flexion 110 63 90 in  sitting   Knee extension 0 -14 -25 extensor lag  Ankle dorsiflexion     Ankle plantarflexion     Ankle inversion     Ankle eversion      (Blank rows = not tested)   TODAY'S TREATMENT: [facility] Adult PT Treatment:                                                DATE: 10/03/22 THEREX Nustep x 5 minutes with bilat UEs/LEs level 5 Seated A/ROM knee flexion to 90 degrees (scooting to edge of mat) Seated LAQ x 5 reps  ( -30 with extensor lag)  Supine Heel slides with tactile cues for quad engagement when extending AAROM into L knee flexion (assisting knee to chest) x 2 reps with foot support Quad sets with tactile cues quad engagement Assisted SLR -- unable to lift on his own, therefore helped x 5 reps SAQ x 10 reps with tactile cues quad engagement  GAIT X 160 feet with RW and cues on L heel strike and hip positioning + upright posture X 80 feet attempting more reciprocal step through pattern versus step to pattern with demo cues-- continues to use significant pressure through RW with UEs             MODALITIES            Vaso x 10 minutes low compression  [facility] Adult PT Treatment:                                                DATE: 09/29/22 Therapeutic Exercise: Nustep x1mn Seated heel slides x10 w strap, 2-3sec hold, cues for ROM STS from standard chair x12 w RW, cues for hand placement and mechanics Neuromuscular re-ed: Quad set 10sec x10 towel under ankle for inc quad activation, tactile/verbal cues as needed   SAQ bolster, x15 tactile/verbal cues as needed , strap assisted 4 inch fwd weight shift for closed chain LE activation x12, BUE support  09/27/22:  Nustep L6 x 10 min  Sit to stand from nustep seat x 10 w/ UE assist Hamstring stretch supine w/ strap 30 x 4 reps  Quad set 10 sec x 10 towel under knee  SAQ w bolster pt using strap with assist x 5  SAQ w/ PT assist lift; iso hold; eccentric lowering 5 Quad set with heel on bolster 10 sec hold x 5 SL hip abduction 5 sec  x 10 Prone glut set 10 sec x 15 Prone Lt LE extension 3 sec x 10  Supine heel slide with strap 5 sec x 10  Supine LE's on orange ball knee flex 5 sec x 10  Modalities: Vaso low pressure x 15 min    PATIENT EDUCATION:  Education details: rationale for interventions, re-printed familiar HEP, verbally reviewed Person educated: Patient Education method: Explanation, Demonstration, Tactile cues, Verbal cues, handouts Education comprehension: verbalized understanding, returned demonstration, verbal cues required, tactile cues required, and needs further education   HOME EXERCISE PROGRAM: Access Code: [email or web address] URL: [email or web address] Date: 09/22/2022 Prepared by: Gillermo Murdoch  Exercises - Ankle Pumps in Elevation  - 2 x daily - 7 x weekly - 1 sets - 10-20 reps - Ankle Circles in Elevation  - 2 x daily - 7 x weekly - 1 sets - 10-15 reps - Supine Quad Set  - 2 x daily - 7 x weekly - 1 sets - 10 reps - 3 sec  hold - Small Range Straight Leg Raise  - 2 x daily - 7 x weekly - 1 sets - 10 reps - 5 sec  hold - Supine Hip Abduction  - 2 x daily - 7 x weekly - 1 sets - 10 reps - 3 sec  hold - Supine Heel Slide with Strap  - 2 x daily - 7 x weekly - 1 sets - 5-10 reps - 10 sec  hold - Seated Heel Slide  - 2 x daily - 7 x weekly - 1 sets - 5-10 reps - 10 sec  hold  ASSESSMENT:  CLINICAL IMPRESSION: The patient arrived with 4/10 pain using tylenol instead of pain meds today.  Pain does increase with therex in session to a 6/10.  Ended with vasopneumatic compression with ice to reduce pain and swelling.  Patient notes he is more sedentary at home and PT discussed benefits of getting up regularly throughout the day.  Patient increased ROM per measurements today.  OBJECTIVE IMPAIRMENTS Abnormal gait, decreased activity tolerance, decreased balance, decreased mobility, difficulty walking, decreased ROM, decreased strength, increased edema, impaired flexibility, postural dysfunction, and  pain.   REHAB POTENTIAL: Good   GOALS: Goals reviewed with patient? Yes  SHORT TERM GOALS: Target date: 10/20/2022  Independent in initial HEP  Baseline: Goal status: INITIAL  2.  Increase AAROM Lt knee to (-)8 to (-)10 ext and 90 to 95 deg flexion  Baseline:  Goal status: INITIAL  3.  Independent in gait with rolling walker improved gait pattern and increased wt bearing Lt LE  Baseline:  Goal status: INITIAL    LONG TERM GOALS: Target date: 11/17/2022   Increased AROM Lt knee to 0 deg extension and 100 degrees flexion Baseline:  Goal status: INITIAL  2.  Increase strength Lt hip and knee to 4+/5 to 5/5  Baseline:  Goal status: INITIAL  3.  Gait with least restrictive assistive device for community distance of at least 400-600 ft Baseline:  Goal status: INITIAL  4.  Independent in all transfers in home  Baseline:  Goal status: INITIAL  5.  Independent in ascending and descending 14 steps in home Baseline:  Goal status: INITIAL  6.  Independent in HEP  Baseline:  Goal status: INITIAL  7. Improve functional limitation score to 45  Goal status: INITIAL   PLAN: PT FREQUENCY: 2x/week  PT DURATION: 8 weeks  PLANNED INTERVENTIONS: Therapeutic exercises, Therapeutic activity, Neuromuscular re-education, Balance training, Gait training, Patient/Family education, Self Care, Joint mobilization, Stair training, DME instructions, Aquatic Therapy, Dry Needling, Electrical stimulation, Cryotherapy, Moist heat, Taping, Vasopneumatic device, Ultrasound, Ionotophoresis '4mg'$ /ml Dexamethasone, Manual therapy, and Re-evaluation  PLAN FOR  NEXT SESSION: continue to progress knee ext/flex as able/appropriate.    [place], PT  10/03/2022 10:59 AM

## 2022-10-04 NOTE — Therapy (Signed)
OUTPATIENT PHYSICAL THERAPY LOWER EXTREMITY TREATMENT   Patient Name: Mack Czaja MRN: [id number] DOB:02-23-1951, 71 y.o., male Today's Date: 10/05/2022   PT End of Session - 10/05/22 1101     Visit Number 5    Number of Visits 16    Date for PT Re-Evaluation 11/17/22    Authorization Type Medicare    Authorization - Visit Number 5    Progress Note Due on Visit 10    PT Start Time 1101    PT Stop Time 1142    PT Time Calculation (min) 41 min    Activity Tolerance Patient tolerated treatment well    Behavior During Therapy [facility] for tasks assessed/performed                Past Medical History:  Diagnosis Date   Anxiety    Arthritis    Essential hypertension, benign 06/29/2011   Hemorrhoids 08/01/2018   History of kidney stones    Morbid obesity ([facility]) 10/12/2018   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  Plan for 5-year recheck.   Vitamin D deficiency 08/01/2017   Past Surgical History:  Procedure Laterality Date   KIDNEY STONE SURGERY     SHOULDER SURGERY  December 2010   Left   TOTAL HIP ARTHROPLASTY Right 04/19/2022   Procedure: RIGHT TOTAL HIP ARTHROPLASTY ANTERIOR APPROACH;  Surgeon: Marcene Corning, MD;  Location: [facility];  Service: Orthopedics;  Laterality: Right;   TOTAL KNEE ARTHROPLASTY Left 09/20/2022   Procedure: LEFT TOTAL KNEE ARTHROPLASTY;  Surgeon: Marcene Corning, MD;  Location: [facility];  Service: Orthopedics;  Laterality: Left;   UMBILICAL HERNIA REPAIR     Patient Active Problem List   Diagnosis Date Noted   COVID-19 08/09/2022   Back pain 06/23/2022   Urinary retention 06/23/2022   Allergic sinusitis 04/12/2022   Rotator cuff tendinitis, right 02/03/2022   Primary osteoarthritis of right hip 06/24/2021   Primary osteoarthritis of left knee 03/29/2021   Hamstring strain, left, initial encounter  02/26/2021   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 01/29/2021   OA (osteoarthritis) of knee 08/16/2020   Neoplasm of uncertain behavior of skin 03/12/2020   Tubular adenoma of colon 10/17/2018   Hyperlipidemia 10/12/2018   Greater trochanteric bursitis of both hips 10/12/2018   Morbid obesity (HCC) 10/12/2018   Vitamin D deficiency 08/01/2017   Neurotic excoriations 07/27/2016   Family history of colon cancer requiring screening colonoscopy 05/16/2012   Prediabetes 05/16/2012   Tobacco abuse 07/20/2011   Hypogonadism male 07/20/2011   Essential hypertension, benign 06/29/2011    PCP: Dr Everrett Coombe  REFERRING PROVIDER: Dr Marcene Corning  REFERRING DIAG: Lt TKA  THERAPY DIAG:  Chronic pain of left knee  Abnormal gait  Muscle weakness (generalized)  Localized edema  Other symptoms and signs involving the musculoskeletal system  Abnormal posture  Stiffness of left knee, not elsewhere classified  Rationale for Evaluation and Treatment Rehabilitation  ONSET DATE: 09/20/22  SUBJECTIVE:   SUBJECTIVE STATEMENT: Pt arrives with report of soreness after last session, mild pain at present but took pain meds before session. States pain meds continue to make him feel groggy. No other new updates  PERTINENT HISTORY: L TKA 09/20/22 Rt THA 5/23; HTN; arthritis  PAIN:   10/05/22 Are you having pain? Yes:  NPRS scale:2-3/10 Pain location: Lt knee  Pain description: sharp;  aching; tight; throbbing  Aggravating factors: movement Relieving factors: meds and elevation   PRECAUTIONS: Other: post L TKA 09/20/22  WEIGHT BEARING RESTRICTIONS No  FALLS:  Has patient fallen in last 6 months? No  PATIENT GOALS use the [name] again and get around without walker or cane   OBJECTIVE:   LOWER EXTREMITY ROM:  Active Assistive ROM Right eval Left eval Left 10/03/22  Hip flexion 120 110   Hip extension 5 0   Hip abduction 26 32   Hip  adduction     Hip internal rotation     Hip external rotation     Knee flexion 110 63 90 in sitting   Knee extension 0 -14 -25 extensor lag  Ankle dorsiflexion     Ankle plantarflexion     Ankle inversion     Ankle eversion      (Blank rows = not tested)   TODAY'S TREATMENT: [facility] Adult PT Treatment:                         DATE: 10/05/22 Therapeutic Exercise: Nu step L5 (seat at 5) B UE/Les Seated AAROM w strap and pillowcase 2x10 LLE, at edge of mat LAQ AAROM w/ strap x10  Supine quad set with towel prop under ankle, x15, cues for reduced compensations at hip SLR AAROM x5 w strap, assist to minimize extensor lag and improve control Supine heel slides AAROM w/ strap, cues for appropriate ROM Gait Training: 4 laps total in gym, rest breaks as needed. Cues for AD management, truncal posture, improved symmetry of step length     [facility] Adult PT Treatment:                                                DATE: 10/03/22 THEREX Nustep x 5 minutes with bilat UEs/LEs level 5 Seated A/ROM knee flexion to 90 degrees (scooting to edge of mat) Seated LAQ x 5 reps  ( -30 with extensor lag)  Supine Heel slides with tactile cues for quad engagement when extending AAROM into L knee flexion (assisting knee to chest) x 2 reps with foot support Quad sets with tactile cues quad engagement Assisted SLR -- unable to lift on his own, therefore helped x 5 reps SAQ x 10 reps with tactile cues quad engagement  GAIT X 160 feet with RW and cues on L heel strike and hip positioning + upright posture X 80 feet attempting more reciprocal step through pattern versus step to pattern with demo cues-- continues to use significant pressure through RW with UEs             MODALITIES            Vaso x 10 minutes low compression  [facility] Adult PT Treatment:                                                DATE: 09/29/22 Therapeutic Exercise: Nustep x23min Seated heel slides x10 w strap, 2-3sec hold, cues for  ROM STS from standard chair x12 w RW, cues for hand placement and mechanics Neuromuscular re-ed: Quad set 10sec x10 towel under ankle for inc quad activation, tactile/verbal cues as needed  SAQ bolster, x15 tactile/verbal cues as needed , strap assisted 4 inch fwd weight shift for closed chain LE activation x12, BUE support    PATIENT EDUCATION:  Education details: rationale for interventions, POC Person educated: Patient Education method: Explanation, Demonstration, Tactile cues, Verbal cues, handouts Education comprehension: verbalized understanding, returned demonstration, verbal cues required, tactile cues required, and needs further education   HOME EXERCISE PROGRAM: Access Code: [email or web address] URL: [email or web address] Date: 09/22/2022 Prepared by: Corlis Leak  Exercises - Ankle Pumps in Elevation  - 2 x daily - 7 x weekly - 1 sets - 10-20 reps - Ankle Circles in Elevation  - 2 x daily - 7 x weekly - 1 sets - 10-15 reps - Supine Quad Set  - 2 x daily - 7 x weekly - 1 sets - 10 reps - 3 sec  hold - Small Range Straight Leg Raise  - 2 x daily - 7 x weekly - 1 sets - 10 reps - 5 sec  hold - Supine Hip Abduction  - 2 x daily - 7 x weekly - 1 sets - 10 reps - 3 sec  hold - Supine Heel Slide with Strap  - 2 x daily - 7 x weekly - 1 sets - 5-10 reps - 10 sec  hold - Seated Heel Slide  - 2 x daily - 7 x weekly - 1 sets - 5-10 reps - 10 sec  hold  ASSESSMENT:  CLINICAL IMPRESSION: Pt arrives with 2-3/10 pain on NPS, report of soreness after last session. Session continues to emphasize knee mobility and quad activation, cues as needed to mitigate compensations and maximize appropriate ROM. Continues with deficits in both extension and flexion, as well as notable quad weakness. With AAROM exercise emphasis placed on maximizing active ROM prior to assistance. Gait training as above. Pt denies increase in pain on departure, reports improved stiffness. Pt departs today's session in  no acute distress, all voiced questions/concerns addressed appropriately from PT perspective.      OBJECTIVE IMPAIRMENTS Abnormal gait, decreased activity tolerance, decreased balance, decreased mobility, difficulty walking, decreased ROM, decreased strength, increased edema, impaired flexibility, postural dysfunction, and pain.   REHAB POTENTIAL: Good   GOALS: Goals reviewed with patient? Yes  SHORT TERM GOALS: Target date: 10/20/2022  Independent in initial HEP  Baseline: Goal status: INITIAL  2.  Increase AAROM Lt knee to (-)8 to (-)10 ext and 90 to 95 deg flexion  Baseline:  Goal status: INITIAL  3.  Independent in gait with rolling walker improved gait pattern and increased wt bearing Lt LE  Baseline:  Goal status: INITIAL    LONG TERM GOALS: Target date: 11/17/2022   Increased AROM Lt knee to 0 deg extension and 100 degrees flexion Baseline:  Goal status: INITIAL  2.  Increase strength Lt hip and knee to 4+/5 to 5/5  Baseline:  Goal status: INITIAL  3.  Gait with least restrictive assistive device for community distance of at least 400-600 ft Baseline:  Goal status: INITIAL  4.  Independent in all transfers in home  Baseline:  Goal status: INITIAL  5.  Independent in ascending and descending 14 steps in home Baseline:  Goal status: INITIAL  6.  Independent in HEP  Baseline:  Goal status: INITIAL  7. Improve functional limitation score to 45  Goal status: INITIAL   PLAN: PT FREQUENCY: 2x/week  PT DURATION: 8 weeks  PLANNED INTERVENTIONS: Therapeutic exercises, Therapeutic activity, Neuromuscular re-education, Balance training, Gait training, Patient/Family education, Self Care,  Joint mobilization, Stair training, DME instructions, Aquatic Therapy, Dry Needling, Electrical stimulation, Cryotherapy, Moist heat, Taping, Vasopneumatic device, Ultrasound, Ionotophoresis 4mg /ml Dexamethasone, Manual therapy, and Re-evaluation  PLAN FOR NEXT SESSION:   continue to progress knee ext/flex as able/appropriate.    Ashley Murrain PT, DPT 10/05/2022 12:00 PM

## 2022-10-05 ENCOUNTER — Ambulatory Visit: Payer: Medicare Other | Admitting: Physical Therapy

## 2022-10-05 ENCOUNTER — Encounter: Payer: Self-pay | Admitting: Physical Therapy

## 2022-10-05 DIAGNOSIS — M25651 Stiffness of right hip, not elsewhere classified: Secondary | ICD-10-CM | POA: Diagnosis not present

## 2022-10-05 DIAGNOSIS — R29898 Other symptoms and signs involving the musculoskeletal system: Secondary | ICD-10-CM | POA: Diagnosis not present

## 2022-10-05 DIAGNOSIS — G8929 Other chronic pain: Secondary | ICD-10-CM

## 2022-10-05 DIAGNOSIS — R269 Unspecified abnormalities of gait and mobility: Secondary | ICD-10-CM

## 2022-10-05 DIAGNOSIS — M25662 Stiffness of left knee, not elsewhere classified: Secondary | ICD-10-CM

## 2022-10-05 DIAGNOSIS — Z96659 Presence of unspecified artificial knee joint: Secondary | ICD-10-CM | POA: Diagnosis not present

## 2022-10-05 DIAGNOSIS — R600 Localized edema: Secondary | ICD-10-CM

## 2022-10-05 DIAGNOSIS — M6281 Muscle weakness (generalized): Secondary | ICD-10-CM

## 2022-10-05 DIAGNOSIS — R293 Abnormal posture: Secondary | ICD-10-CM

## 2022-10-05 DIAGNOSIS — M25561 Pain in right knee: Secondary | ICD-10-CM | POA: Diagnosis not present

## 2022-10-10 ENCOUNTER — Ambulatory Visit: Payer: Medicare Other | Admitting: Physical Therapy

## 2022-10-10 ENCOUNTER — Encounter: Payer: Self-pay | Admitting: Physical Therapy

## 2022-10-10 DIAGNOSIS — M25651 Stiffness of right hip, not elsewhere classified: Secondary | ICD-10-CM | POA: Diagnosis not present

## 2022-10-10 DIAGNOSIS — M25561 Pain in right knee: Secondary | ICD-10-CM | POA: Diagnosis not present

## 2022-10-10 DIAGNOSIS — R269 Unspecified abnormalities of gait and mobility: Secondary | ICD-10-CM

## 2022-10-10 DIAGNOSIS — M6281 Muscle weakness (generalized): Secondary | ICD-10-CM | POA: Diagnosis not present

## 2022-10-10 DIAGNOSIS — R600 Localized edema: Secondary | ICD-10-CM

## 2022-10-10 DIAGNOSIS — M25662 Stiffness of left knee, not elsewhere classified: Secondary | ICD-10-CM

## 2022-10-10 DIAGNOSIS — G8929 Other chronic pain: Secondary | ICD-10-CM | POA: Diagnosis not present

## 2022-10-10 DIAGNOSIS — R29898 Other symptoms and signs involving the musculoskeletal system: Secondary | ICD-10-CM | POA: Diagnosis not present

## 2022-10-10 DIAGNOSIS — Z96659 Presence of unspecified artificial knee joint: Secondary | ICD-10-CM | POA: Diagnosis not present

## 2022-10-10 NOTE — Therapy (Signed)
OUTPATIENT PHYSICAL THERAPY LOWER EXTREMITY TREATMENT   Patient Name: Kyng Matlock MRN: [id number] DOB:[birth year], 71 y.o., male Today's Date: 10/10/2022   PT End of Session - 10/10/22 1050     Visit Number 6    Number of Visits 16    Date for PT Re-Evaluation 11/17/22    Authorization Type Medicare    Authorization - Visit Number 6    Progress Note Due on Visit 10    PT Start Time 6629    PT Stop Time 4765    PT Time Calculation (min) 45 min    Activity Tolerance Patient tolerated treatment well    Behavior During Therapy [facility] for tasks assessed/performed                Past Medical History:  Diagnosis Date   Anxiety    Arthritis    Essential hypertension, benign 06/29/2011   Hemorrhoids 08/01/2018   History of kidney stones    Morbid obesity ([place]) 10/12/2018   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  Plan for 5-year recheck.   Vitamin D deficiency 08/01/2017   Past Surgical History:  Procedure Laterality Date   KIDNEY STONE SURGERY     SHOULDER SURGERY  December 2010   Left   TOTAL HIP ARTHROPLASTY Right 04/19/2022   Procedure: RIGHT TOTAL HIP ARTHROPLASTY ANTERIOR APPROACH;  Surgeon: Melrose Nakayama, MD;  Location: [facility];  Service: Orthopedics;  Laterality: Right;   TOTAL KNEE ARTHROPLASTY Left 09/20/2022   Procedure: LEFT TOTAL KNEE ARTHROPLASTY;  Surgeon: Melrose Nakayama, MD;  Location: [facility];  Service: Orthopedics;  Laterality: Left;   UMBILICAL HERNIA REPAIR     Patient Active Problem List   Diagnosis Date Noted   COVID-19 08/09/2022   Back pain 06/23/2022   Urinary retention 06/23/2022   Allergic sinusitis 04/12/2022   Rotator cuff tendinitis, right 02/03/2022   Primary osteoarthritis of right hip 06/24/2021   Primary osteoarthritis of left knee 03/29/2021   Hamstring strain, left, initial encounter  02/26/2021   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 01/29/2021   OA (osteoarthritis) of knee 08/16/2020   Neoplasm of uncertain behavior of skin 03/12/2020   Tubular adenoma of colon 10/17/2018   Hyperlipidemia 10/12/2018   Greater trochanteric bursitis of both hips 10/12/2018   Morbid obesity ([place]) 10/12/2018   Vitamin D deficiency 08/01/2017   Neurotic excoriations 07/27/2016   Family history of colon cancer requiring screening colonoscopy 05/16/2012   Prediabetes 05/16/2012   Tobacco abuse 07/20/2011   Hypogonadism male 07/20/2011   Essential hypertension, benign 06/29/2011    PCP: Dr Luetta Nutting  REFERRING PROVIDER: Dr Melrose Nakayama  REFERRING DIAG: Lt TKA  THERAPY DIAG:  Chronic pain of left knee  Abnormal gait  Muscle weakness (generalized)  Localized edema  Other symptoms and signs involving the musculoskeletal system  Stiffness of left knee, not elsewhere classified  Rationale for Evaluation and Treatment Rehabilitation  ONSET DATE: 09/20/22  SUBJECTIVE:   SUBJECTIVE STATEMENT: Pt states he uses a floor pedaler at home for ~5 min. Exercises continue to go well.   PERTINENT HISTORY: L TKA 09/20/22 Rt THA 5/23; HTN; arthritis  PAIN:  Are you having pain? Yes:  NPRS scale:3/10 Pain location: Lt knee  Pain description: sharp; aching; tight; throbbing  Aggravating factors: movement Relieving factors: meds and elevation   PRECAUTIONS: Other: post L TKA  09/20/22  WEIGHT BEARING RESTRICTIONS No  FALLS:  Has patient fallen in last 6 months? No  PATIENT GOALS use the [name] again and get around without walker or cane   OBJECTIVE:   LOWER EXTREMITY ROM:  Active Assistive ROM Right eval Left eval Left 10/23 Left 10/30  Hip flexion 120 110    Hip extension 5 0    Hip abduction 26 32    Hip adduction      Hip internal rotation      Hip external rotation      Knee flexion 110 63 90 in sitting  100 sup  Knee  extension 0 -14 -25 extensor lag -10 prone  Ankle dorsiflexion      Ankle plantarflexion      Ankle inversion      Ankle eversion       (Blank rows = not tested)   TODAY'S TREATMENT: [facility] Adult PT Treatment:                   ________      DATE: 10/10/22 Therapeutic Exercise: Recumbent bike partial revolutions x 5 min Supine Heel slides with strap x10 Quad set with bolster under ankle 2x10x3 sec Knee ext static stretch with bolster under ankle and 2# x2 min SAQ 2x10 Prone Hamstring curl 2x10 Quad set x10 Standing Terminal knee ext 2x10 Backwards walking 2x15' Manual therapy: Knee flex/ext PROM Grade II and III tibiofemoral mobs for knee flex/ext IASTM Hamstrings Vaso: x10 min medium pressure, 34 deg   [facility] Adult PT Treatment:              __________           DATE: 10/05/22 Therapeutic Exercise: Nu step L5 (seat at 5) B UE/Les Seated AAROM w strap and pillowcase 2x10 LLE, at edge of mat LAQ AAROM w/ strap x10  Supine quad set with towel prop under ankle, x15, cues for reduced compensations at hip SLR AAROM x5 w strap, assist to minimize extensor lag and improve control Supine heel slides AAROM w/ strap, cues for appropriate ROM Gait Training: 4 laps total in gym, rest breaks as needed. Cues for AD management, truncal posture, improved symmetry of step length    [facility] Adult PT Treatment:                                                DATE: 10/03/22 THEREX Nustep x 5 minutes with bilat UEs/LEs level 5 Seated A/ROM knee flexion to 90 degrees (scooting to edge of mat) Seated LAQ x 5 reps  ( -30 with extensor lag)  Supine Heel slides with tactile cues for quad engagement when extending AAROM into L knee flexion (assisting knee to chest) x 2 reps with foot support Quad sets with tactile cues quad engagement Assisted SLR -- unable to lift on his own, therefore helped x 5 reps SAQ x 10 reps with tactile cues quad engagement  GAIT X 160 feet with RW and cues on L heel  strike and hip positioning + upright posture X 80 feet attempting more reciprocal step through pattern versus step to pattern with demo cues-- continues to use significant pressure through RW with UEs             MODALITIES            Vaso x 10  minutes low compression    PATIENT EDUCATION:  Education details: rationale for interventions, POC Person educated: Patient Education method: Explanation, Demonstration, Tactile cues, Verbal cues, handouts Education comprehension: verbalized understanding, returned demonstration, verbal cues required, tactile cues required, and needs further education   HOME EXERCISE PROGRAM: Access Code: [email or web address] URL: [email or web address] Date: 09/22/2022 Prepared by: Gillermo Murdoch  Exercises - Ankle Pumps in Elevation  - 2 x daily - 7 x weekly - 1 sets - 10-20 reps - Ankle Circles in Elevation  - 2 x daily - 7 x weekly - 1 sets - 10-15 reps - Supine Quad Set  - 2 x daily - 7 x weekly - 1 sets - 10 reps - 3 sec  hold - Small Range Straight Leg Raise  - 2 x daily - 7 x weekly - 1 sets - 10 reps - 5 sec  hold - Supine Hip Abduction  - 2 x daily - 7 x weekly - 1 sets - 10 reps - 3 sec  hold - Supine Heel Slide with Strap  - 2 x daily - 7 x weekly - 1 sets - 5-10 reps - 10 sec  hold - Seated Heel Slide  - 2 x daily - 7 x weekly - 1 sets - 5-10 reps - 10 sec  hold  ASSESSMENT:  CLINICAL IMPRESSION: Continued to work on improving knee ROM. Pt with continued good gains into flexion. Ext remains most limited (remains around -10 deg). Pt able to tolerate partial revolutions on recumbent bike this session and manual work/joint mobilizations.     OBJECTIVE IMPAIRMENTS Abnormal gait, decreased activity tolerance, decreased balance, decreased mobility, difficulty walking, decreased ROM, decreased strength, increased edema, impaired flexibility, postural dysfunction, and pain.   REHAB POTENTIAL: Good   GOALS: Goals reviewed with patient? Yes  SHORT  TERM GOALS: Target date: 10/20/2022  Independent in initial HEP  Baseline: Goal status: INITIAL  2.  Increase AAROM Lt knee to (-)8 to (-)10 ext and 90 to 95 deg flexion  Baseline:  Goal status: INITIAL  3.  Independent in gait with rolling walker improved gait pattern and increased wt bearing Lt LE  Baseline:  Goal status: INITIAL    LONG TERM GOALS: Target date: 11/17/2022   Increased AROM Lt knee to 0 deg extension and 100 degrees flexion Baseline:  Goal status: INITIAL  2.  Increase strength Lt hip and knee to 4+/5 to 5/5  Baseline:  Goal status: INITIAL  3.  Gait with least restrictive assistive device for community distance of at least 400-600 ft Baseline:  Goal status: INITIAL  4.  Independent in all transfers in home  Baseline:  Goal status: INITIAL  5.  Independent in ascending and descending 14 steps in home Baseline:  Goal status: INITIAL  6.  Independent in HEP  Baseline:  Goal status: INITIAL  7. Improve functional limitation score to 45  Goal status: INITIAL   PLAN: PT FREQUENCY: 2x/week  PT DURATION: 8 weeks  PLANNED INTERVENTIONS: Therapeutic exercises, Therapeutic activity, Neuromuscular re-education, Balance training, Gait training, Patient/Family education, Self Care, Joint mobilization, Stair training, DME instructions, Aquatic Therapy, Dry Needling, Electrical stimulation, Cryotherapy, Moist heat, Taping, Vasopneumatic device, Ultrasound, Ionotophoresis '4mg'$ /ml Dexamethasone, Manual therapy, and Re-evaluation  PLAN FOR NEXT SESSION:  continue to progress knee ext/flex as able/appropriate.    Cordai Rodrigue April Ma L Ahnya Akre, PT 10/10/2022 10:51 AM

## 2022-10-12 ENCOUNTER — Encounter: Payer: Self-pay | Admitting: Rehabilitative and Restorative Service Providers"

## 2022-10-12 ENCOUNTER — Ambulatory Visit: Payer: Medicare Other | Attending: Family Medicine | Admitting: Rehabilitative and Restorative Service Providers"

## 2022-10-12 DIAGNOSIS — G8929 Other chronic pain: Secondary | ICD-10-CM | POA: Insufficient documentation

## 2022-10-12 DIAGNOSIS — R600 Localized edema: Secondary | ICD-10-CM | POA: Insufficient documentation

## 2022-10-12 DIAGNOSIS — M25662 Stiffness of left knee, not elsewhere classified: Secondary | ICD-10-CM | POA: Diagnosis not present

## 2022-10-12 DIAGNOSIS — M6281 Muscle weakness (generalized): Secondary | ICD-10-CM | POA: Diagnosis not present

## 2022-10-12 DIAGNOSIS — R269 Unspecified abnormalities of gait and mobility: Secondary | ICD-10-CM | POA: Diagnosis not present

## 2022-10-12 DIAGNOSIS — R29898 Other symptoms and signs involving the musculoskeletal system: Secondary | ICD-10-CM | POA: Insufficient documentation

## 2022-10-12 DIAGNOSIS — M25562 Pain in left knee: Secondary | ICD-10-CM | POA: Insufficient documentation

## 2022-10-12 NOTE — Therapy (Signed)
OUTPATIENT PHYSICAL THERAPY LOWER EXTREMITY TREATMENT   Patient Name: Erik Estrada MRN: [id number] DOB:01-09-1951, 71 y.o., male Today's Date: 10/12/2022   PT End of Session - 10/12/22 0913     Visit Number 7    Number of Visits 16    Date for PT Re-Evaluation 11/17/22    Authorization Type Medicare    Authorization - Visit Number 7    Progress Note Due on Visit 10    PT Start Time 0913    PT Stop Time 1004    PT Time Calculation (min) 51 min    Activity Tolerance Patient tolerated treatment well                Past Medical History:  Diagnosis Date   Anxiety    Arthritis    Essential hypertension, benign 06/29/2011   Hemorrhoids 08/01/2018   History of kidney stones    Morbid obesity ([place]) 10/12/2018   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  Plan for 5-year recheck.   Vitamin D deficiency 08/01/2017   Past Surgical History:  Procedure Laterality Date   KIDNEY STONE SURGERY     SHOULDER SURGERY  December 2010   Left   TOTAL HIP ARTHROPLASTY Right 04/19/2022   Procedure: RIGHT TOTAL HIP ARTHROPLASTY ANTERIOR APPROACH;  Surgeon: Melrose Nakayama, MD;  Location: [facility];  Service: Orthopedics;  Laterality: Right;   TOTAL KNEE ARTHROPLASTY Left 09/20/2022   Procedure: LEFT TOTAL KNEE ARTHROPLASTY;  Surgeon: Melrose Nakayama, MD;  Location: [facility];  Service: Orthopedics;  Laterality: Left;   UMBILICAL HERNIA REPAIR     Patient Active Problem List   Diagnosis Date Noted   COVID-19 08/09/2022   Back pain 06/23/2022   Urinary retention 06/23/2022   Allergic sinusitis 04/12/2022   Rotator cuff tendinitis, right 02/03/2022   Primary osteoarthritis of right hip 06/24/2021   Primary osteoarthritis of left knee 03/29/2021   Hamstring strain, left, initial encounter 02/26/2021   Degenerative tear of posterior horn of lateral meniscus  of left knee 01/29/2021   OA (osteoarthritis) of knee 08/16/2020   Neoplasm of uncertain behavior of skin 03/12/2020   Tubular adenoma of colon 10/17/2018   Hyperlipidemia 10/12/2018   Greater trochanteric bursitis of both hips 10/12/2018   Morbid obesity ([place]) 10/12/2018   Vitamin D deficiency 08/01/2017   Neurotic excoriations 07/27/2016   Family history of colon cancer requiring screening colonoscopy 05/16/2012   Prediabetes 05/16/2012   Tobacco abuse 07/20/2011   Hypogonadism male 07/20/2011   Essential hypertension, benign 06/29/2011    PCP: Dr Luetta Nutting  REFERRING PROVIDER: Dr Melrose Nakayama  REFERRING DIAG: Lt TKA  THERAPY DIAG:  Chronic pain of left knee  Abnormal gait  Muscle weakness (generalized)  Localized edema  Other symptoms and signs involving the musculoskeletal system  Stiffness of left knee, not elsewhere classified  Rationale for Evaluation and Treatment Rehabilitation  ONSET DATE: 09/20/22  SUBJECTIVE:   SUBJECTIVE STATEMENT: Working on exercises at home. Worked hard at home yesterday. Still having some pain and taking some pain medication - trying to take less medication,   PERTINENT HISTORY: Lt TKA 09/20/22 Rt THA 5/23; HTN; arthritis  PAIN:  Are you having pain? Yes:  NPRS scale:1-2/10 Pain location: Lt knee  Pain description: sharp; aching; tight; throbbing  Aggravating factors: movement Relieving factors: meds and elevation   PRECAUTIONS: Other: post L TKA 09/20/22  WEIGHT BEARING RESTRICTIONS No  FALLS:  Has patient fallen in last 6 months? No  PATIENT GOALS use the [name] again and get around without walker or cane   OBJECTIVE:   LOWER EXTREMITY ROM:  Active Assistive ROM Right eval Left eval Left 10/23 Left 10/30  Hip flexion 120 110    Hip extension 5 0    Hip abduction 26 32    Hip adduction      Hip internal rotation      Hip external rotation      Knee flexion 110 63 90 in sitting  100 sup  Knee  extension 0 -14 -25 extensor lag -10 prone  Ankle dorsiflexion      Ankle plantarflexion      Ankle inversion      Ankle eversion       (Blank rows = not tested)   TODAY'S TREATMENT: [facility] Adult PT Treatment:                   ________       DATE: 10/12/22 Therapeutic exercise:  Nustep L5 x 7 min   Sitting    Sit to stand x 5 w/ UE assist x 5 w/out UE assist  Supine Heel slides with strap x10 Quad set with bolster under ankle 1x10x3 sec overpressure by PT x 5 reps x 5 3 sec hold SAQ 2x10 last 10 w/PT overpressure hold at end range Prone PROM flexion w/strap 4-5 sec hold x 10  Quad set x10 for knee extension Standing Terminal knee ext pressing ball 10 sec 2x10  TKE green TB 10 sec x 10  Isometric knee extension PT resistance w/ TB Backwards walking 8 x 10' at counter  Manual therapy: Knee flex/ext PROM Grade II and III tibiofemoral mobs for knee flex/ext  Vaso: x10 min medium pressure, 34 deg   DATE: 10/10/22 Therapeutic Exercise: Recumbent bike partial revolutions x 5 min Supine Heel slides with strap x10 Quad set with bolster under ankle 2x10x3 sec Knee ext static stretch with bolster under ankle and 2# x2 min SAQ 2x10 Prone Hamstring curl 2x10 Quad set x10 Standing Terminal knee ext 2x10 Backwards walking 2x15' Manual therapy: Knee flex/ext PROM Grade II and III tibiofemoral mobs for knee flex/ext IASTM Hamstrings Vaso: x10 min medium pressure, 34 deg    PATIENT EDUCATION:  Education details: rationale for interventions, POC Person educated: Patient Education method: Explanation, Demonstration, Tactile cues, Verbal cues, handouts Education comprehension: verbalized understanding, returned demonstration, verbal cues required, tactile cues required, and needs further education   HOME EXERCISE PROGRAM: Access Code: [email or web address] URL: [email or web address] Date: 09/22/2022 Prepared by: Gillermo Murdoch  Exercises - Ankle Pumps in Elevation  - 2  x daily - 7 x weekly - 1 sets - 10-20 reps - Ankle Circles in Elevation  - 2 x daily - 7 x weekly - 1 sets - 10-15 reps - Supine Quad Set  - 2 x daily - 7 x weekly - 1 sets - 10 reps - 3 sec  hold - Small Range Straight Leg Raise  - 2 x daily - 7 x weekly - 1 sets - 10 reps - 5 sec  hold - Supine Hip Abduction  - 2 x daily - 7 x weekly - 1 sets - 10 reps - 3 sec  hold - Supine Heel Slide with Strap  - 2 x daily - 7 x weekly - 1 sets - 5-10 reps - 10 sec  hold - Seated  Heel Slide  - 2 x daily - 7 x weekly - 1 sets - 5-10 reps - 10 sec  hold  ASSESSMENT:  CLINICAL IMPRESSION: Working on Lt knee ROM, mobility, strength. Knee extension continues to be most limited compared to flexion. Gradually progressing with knee rehab.   OBJECTIVE IMPAIRMENTS Abnormal gait, decreased activity tolerance, decreased balance, decreased mobility, difficulty walking, decreased ROM, decreased strength, increased edema, impaired flexibility, postural dysfunction, and pain.   REHAB POTENTIAL: Good   GOALS: Goals reviewed with patient? Yes  SHORT TERM GOALS: Target date: 10/20/2022  Independent in initial HEP  Baseline: Goal status: INITIAL  2.  Increase AAROM Lt knee to (-)8 to (-)10 ext and 90 to 95 deg flexion  Baseline:  Goal status: INITIAL  3.  Independent in gait with rolling walker improved gait pattern and increased wt bearing Lt LE  Baseline:  Goal status: INITIAL    LONG TERM GOALS: Target date: 11/17/2022   Increased AROM Lt knee to 0 deg extension and 100 degrees flexion Baseline:  Goal status: INITIAL  2.  Increase strength Lt hip and knee to 4+/5 to 5/5  Baseline:  Goal status: INITIAL  3.  Gait with least restrictive assistive device for community distance of at least 400-600 ft Baseline:  Goal status: INITIAL  4.  Independent in all transfers in home  Baseline:  Goal status: INITIAL  5.  Independent in ascending and descending 14 steps in home Baseline:  Goal status:  INITIAL  6.  Independent in HEP  Baseline:  Goal status: INITIAL  7. Improve functional limitation score to 45  Goal status: INITIAL   PLAN: PT FREQUENCY: 2x/week  PT DURATION: 8 weeks  PLANNED INTERVENTIONS: Therapeutic exercises, Therapeutic activity, Neuromuscular re-education, Balance training, Gait training, Patient/Family education, Self Care, Joint mobilization, Stair training, DME instructions, Aquatic Therapy, Dry Needling, Electrical stimulation, Cryotherapy, Moist heat, Taping, Vasopneumatic device, Ultrasound, Ionotophoresis '4mg'$ /ml Dexamethasone, Manual therapy, and Re-evaluation  PLAN FOR NEXT SESSION:  continue to progress knee ext/flex as able/appropriate.    Everardo All, PT, MPH  10/12/2022 10:11 AM

## 2022-10-17 ENCOUNTER — Ambulatory Visit: Payer: Medicare Other | Admitting: Rehabilitative and Restorative Service Providers"

## 2022-10-17 ENCOUNTER — Encounter: Payer: Self-pay | Admitting: Rehabilitative and Restorative Service Providers"

## 2022-10-17 DIAGNOSIS — M6281 Muscle weakness (generalized): Secondary | ICD-10-CM | POA: Diagnosis not present

## 2022-10-17 DIAGNOSIS — R600 Localized edema: Secondary | ICD-10-CM | POA: Diagnosis not present

## 2022-10-17 DIAGNOSIS — R269 Unspecified abnormalities of gait and mobility: Secondary | ICD-10-CM | POA: Diagnosis not present

## 2022-10-17 DIAGNOSIS — R29898 Other symptoms and signs involving the musculoskeletal system: Secondary | ICD-10-CM

## 2022-10-17 DIAGNOSIS — G8929 Other chronic pain: Secondary | ICD-10-CM | POA: Diagnosis not present

## 2022-10-17 DIAGNOSIS — M25662 Stiffness of left knee, not elsewhere classified: Secondary | ICD-10-CM

## 2022-10-17 DIAGNOSIS — M25562 Pain in left knee: Secondary | ICD-10-CM | POA: Diagnosis not present

## 2022-10-17 NOTE — Therapy (Signed)
OUTPATIENT PHYSICAL THERAPY LOWER EXTREMITY TREATMENT   Patient Name: Erik Estrada MRN: [id number] DOB:October 05, 1951, 71 y.o., male Today's Date: 10/17/2022   PT End of Session - 10/17/22 1038     Visit Number 8    Number of Visits 16    Date for PT Re-Evaluation 11/17/22    Authorization Type Medicare    Authorization - Visit Number 8    Progress Note Due on Visit 10    PT Start Time 1038    PT Stop Time 1129    PT Time Calculation (min) 51 min    Activity Tolerance Patient tolerated treatment well                Past Medical History:  Diagnosis Date   Anxiety    Arthritis    Essential hypertension, benign 06/29/2011   Hemorrhoids 08/01/2018   History of kidney stones    Morbid obesity ([place]) 10/12/2018   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  Plan for 5-year recheck.   Vitamin D deficiency 08/01/2017   Past Surgical History:  Procedure Laterality Date   KIDNEY STONE SURGERY     SHOULDER SURGERY  December 2010   Left   TOTAL HIP ARTHROPLASTY Right 04/19/2022   Procedure: RIGHT TOTAL HIP ARTHROPLASTY ANTERIOR APPROACH;  Surgeon: Melrose Nakayama, MD;  Location: [facility];  Service: Orthopedics;  Laterality: Right;   TOTAL KNEE ARTHROPLASTY Left 09/20/2022   Procedure: LEFT TOTAL KNEE ARTHROPLASTY;  Surgeon: Melrose Nakayama, MD;  Location: [facility];  Service: Orthopedics;  Laterality: Left;   UMBILICAL HERNIA REPAIR     Patient Active Problem List   Diagnosis Date Noted   COVID-19 08/09/2022   Back pain 06/23/2022   Urinary retention 06/23/2022   Allergic sinusitis 04/12/2022   Rotator cuff tendinitis, right 02/03/2022   Primary osteoarthritis of right hip 06/24/2021   Primary osteoarthritis of left knee 03/29/2021   Hamstring strain, left, initial encounter 02/26/2021   Degenerative tear of posterior horn of lateral meniscus  of left knee 01/29/2021   OA (osteoarthritis) of knee 08/16/2020   Neoplasm of uncertain behavior of skin 03/12/2020   Tubular adenoma of colon 10/17/2018   Hyperlipidemia 10/12/2018   Greater trochanteric bursitis of both hips 10/12/2018   Morbid obesity ([place]) 10/12/2018   Vitamin D deficiency 08/01/2017   Neurotic excoriations 07/27/2016   Family history of colon cancer requiring screening colonoscopy 05/16/2012   Prediabetes 05/16/2012   Tobacco abuse 07/20/2011   Hypogonadism male 07/20/2011   Essential hypertension, benign 06/29/2011    PCP: Dr Luetta Nutting  REFERRING PROVIDER: Dr Melrose Nakayama  REFERRING DIAG: Lt TKA  THERAPY DIAG:  Chronic pain of left knee  Abnormal gait  Muscle weakness (generalized)  Localized edema  Other symptoms and signs involving the musculoskeletal system  Stiffness of left knee, not elsewhere classified  Rationale for Evaluation and Treatment Rehabilitation  ONSET DATE: 09/20/22  SUBJECTIVE:   SUBJECTIVE STATEMENT: Seemed to have a reaction to the bandage adhesive so the skin around the incision is irritated. Working on exercises at home. Worked hard at home yesterday. Still having some pain and taking some pain medication - trying to take less medication,   PERTINENT HISTORY: Lt TKA 09/20/22 Rt THA 5/23; HTN; arthritis  PAIN:  Are you having pain? Yes:  NPRS scale:2-3/10 Pain location: Lt knee  Pain description: sharp; aching; tight; throbbing  Aggravating factors: movement Relieving factors: meds and elevation   PRECAUTIONS: Other: post L TKA 09/20/22  WEIGHT BEARING RESTRICTIONS No  FALLS:  Has patient fallen in last 6 months? No  PATIENT GOALS use the [name] again and get around without walker or cane   OBJECTIVE:   LOWER EXTREMITY ROM:  Active Assistive ROM Right eval Left eval Left 10/23 Left 10/30  Hip flexion 120 110    Hip extension 5 0    Hip abduction 26 32    Hip adduction      Hip internal  rotation      Hip external rotation      Knee flexion 110 63 90 in sitting  100 sup  Knee extension 0 -14 -25 extensor lag -10 prone  Ankle dorsiflexion      Ankle plantarflexion      Ankle inversion      Ankle eversion       (Blank rows = not tested)   TODAY'S TREATMENT: [facility] Adult PT Treatment:                   ________       DATE: 10/17/22:  10/12/22 Therapeutic exercise:  Nustep L5 x 10 min   Sitting    Sit to stand x 5 w/ UE assist x 5 w/out UE assist  Supine Heel slides with strap x10 Quad set with bolster under ankle 1x10x3 sec overpressure by PT x 5 reps x 5 3 sec hold SAQ 2x10 last 10 w/PT overpressure hold at end range Prone PROM flexion w/strap 4-5 sec hold x 10  Quad set x10 for knee extension Standing Terminal knee ext pressing ball 10 sec 2x10  TKE green TB 10 sec x 10  Isometric knee extension PT resistance w/ TB Backwards walking 8 x 10' at counter  Manual therapy: Knee flex/ext PROM Grade II and III tibiofemoral mobs for knee flex/ext  Vaso: x10 min medium pressure, 34 deg   10/12/22 Therapeutic exercise:  Nustep L5 x 7 min   Sitting    Sit to stand x 10 w/out UE assist  Supine Heel slides with strap x10 Quad set with bolster under ankle 1x10x5 sec overpressure by PT x 5 reps x 5 w/3 sec hold SAQ 2x10 last 10 sec hold at end range Prone PROM flexion w/strap 4-5 sec hold x 10  Quad set x10 for knee extension Standing Terminal knee ext pressing ball 10 sec 2x10  TKE green TB 10 sec x 10  Isometric knee extension PT resistance w/ TB Backwards walking 8 x 10' at counter Knee bends x 10 bilat UE support  Heel raise x 10 UE support  Tapping Lt foot to 6 in step x 15; 12 inch step x 15 Knee flexion stretch 12 inch step x 5 10-15 sec   Sidelying Clam x 10  Hip abduction 3 sec hold 10 reps x 2 sets   Gait training:  Working on gait with SPC x 320 ft SBA VC for wt shift and straightening Lt LE   Manual therapy: Knee flex/ext PROM Grade  II and III tibiofemoral mobs for knee flex/ext  Vaso: x10 min medium pressure, 34 deg   PATIENT EDUCATION:  Education details: rationale for interventions, POC Person educated: Patient Education method: Explanation, Demonstration, Tactile cues, Verbal cues, handouts Education comprehension: verbalized understanding, returned demonstration, verbal cues required, tactile cues required, and needs further education   HOME EXERCISE PROGRAM: Access Code: [email or web address] URL: [email or web address] Date:  09/22/2022 Prepared by: Gillermo Murdoch  Exercises - Ankle Pumps in Elevation  - 2 x daily - 7 x weekly - 1 sets - 10-20 reps - Ankle Circles in Elevation  - 2 x daily - 7 x weekly - 1 sets - 10-15 reps - Supine Quad Set  - 2 x daily - 7 x weekly - 1 sets - 10 reps - 3 sec  hold - Small Range Straight Leg Raise  - 2 x daily - 7 x weekly - 1 sets - 10 reps - 5 sec  hold - Supine Hip Abduction  - 2 x daily - 7 x weekly - 1 sets - 10 reps - 3 sec  hold - Supine Heel Slide with Strap  - 2 x daily - 7 x weekly - 1 sets - 5-10 reps - 10 sec  hold - Seated Heel Slide  - 2 x daily - 7 x weekly - 1 sets - 5-10 reps - 10 sec  hold  ASSESSMENT:  CLINICAL IMPRESSION: Continued gradual progression with Lt knee rehab. Working on Lt knee ROM, mobility, strength. Knee extension continues to be most limited compared to flexion.   OBJECTIVE IMPAIRMENTS Abnormal gait, decreased activity tolerance, decreased balance, decreased mobility, difficulty walking, decreased ROM, decreased strength, increased edema, impaired flexibility, postural dysfunction, and pain.   REHAB POTENTIAL: Good   GOALS: Goals reviewed with patient? Yes  SHORT TERM GOALS: Target date: 10/20/2022  Independent in initial HEP  Baseline: Goal status: INITIAL  2.  Increase AAROM Lt knee to (-)8 to (-)10 ext and 90 to 95 deg flexion  Baseline:  Goal status: INITIAL  3.  Independent in gait with rolling walker improved gait  pattern and increased wt bearing Lt LE  Baseline:  Goal status: INITIAL    LONG TERM GOALS: Target date: 11/17/2022   Increased AROM Lt knee to 0 deg extension and 100 degrees flexion Baseline:  Goal status: INITIAL  2.  Increase strength Lt hip and knee to 4+/5 to 5/5  Baseline:  Goal status: INITIAL  3.  Gait with least restrictive assistive device for community distance of at least 400-600 ft Baseline:  Goal status: INITIAL  4.  Independent in all transfers in home  Baseline:  Goal status: INITIAL  5.  Independent in ascending and descending 14 steps in home Baseline:  Goal status: INITIAL  6.  Independent in HEP  Baseline:  Goal status: INITIAL  7. Improve functional limitation score to 45  Goal status: INITIAL   PLAN: PT FREQUENCY: 2x/week  PT DURATION: 8 weeks  PLANNED INTERVENTIONS: Therapeutic exercises, Therapeutic activity, Neuromuscular re-education, Balance training, Gait training, Patient/Family education, Self Care, Joint mobilization, Stair training, DME instructions, Aquatic Therapy, Dry Needling, Electrical stimulation, Cryotherapy, Moist heat, Taping, Vasopneumatic device, Ultrasound, Ionotophoresis '4mg'$ /ml Dexamethasone, Manual therapy, and Re-evaluation  PLAN FOR NEXT SESSION:  continue knee rehab - working on ROM and strengthening; focus on gaining ext/flex. Note to MD at next visit   Everardo All, PT, MPH  10/17/2022 10:40 AM

## 2022-10-19 ENCOUNTER — Encounter: Payer: Self-pay | Admitting: Rehabilitative and Restorative Service Providers"

## 2022-10-19 ENCOUNTER — Ambulatory Visit: Payer: Medicare Other | Admitting: Rehabilitative and Restorative Service Providers"

## 2022-10-19 DIAGNOSIS — G8929 Other chronic pain: Secondary | ICD-10-CM | POA: Diagnosis not present

## 2022-10-19 DIAGNOSIS — R29898 Other symptoms and signs involving the musculoskeletal system: Secondary | ICD-10-CM | POA: Diagnosis not present

## 2022-10-19 DIAGNOSIS — R600 Localized edema: Secondary | ICD-10-CM

## 2022-10-19 DIAGNOSIS — M6281 Muscle weakness (generalized): Secondary | ICD-10-CM | POA: Diagnosis not present

## 2022-10-19 DIAGNOSIS — R269 Unspecified abnormalities of gait and mobility: Secondary | ICD-10-CM | POA: Diagnosis not present

## 2022-10-19 DIAGNOSIS — M25562 Pain in left knee: Secondary | ICD-10-CM | POA: Diagnosis not present

## 2022-10-19 DIAGNOSIS — M25662 Stiffness of left knee, not elsewhere classified: Secondary | ICD-10-CM

## 2022-10-19 NOTE — Therapy (Signed)
OUTPATIENT PHYSICAL THERAPY LOWER EXTREMITY TREATMENT   Patient Name: Erik Estrada MRN: [id number] DOB:1951-07-17, 71 y.o., male Today's Date: 10/19/2022   PT End of Session - 10/19/22 1109     Visit Number 9    Number of Visits 16    Date for PT Re-Evaluation 11/17/22    Authorization Type Medicare    Authorization - Visit Number 9    Progress Note Due on Visit 10    PT Start Time 1054    PT Stop Time 4098    PT Time Calculation (min) 51 min    Activity Tolerance Patient tolerated treatment well                Past Medical History:  Diagnosis Date   Anxiety    Arthritis    Essential hypertension, benign 06/29/2011   Hemorrhoids 08/01/2018   History of kidney stones    Morbid obesity ([place]) 10/12/2018   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  Plan for 5-year recheck.   Vitamin D deficiency 08/01/2017   Past Surgical History:  Procedure Laterality Date   KIDNEY STONE SURGERY     SHOULDER SURGERY  December 2010   Left   TOTAL HIP ARTHROPLASTY Right 04/19/2022   Procedure: RIGHT TOTAL HIP ARTHROPLASTY ANTERIOR APPROACH;  Surgeon: Melrose Nakayama, MD;  Location: [facility];  Service: Orthopedics;  Laterality: Right;   TOTAL KNEE ARTHROPLASTY Left 09/20/2022   Procedure: LEFT TOTAL KNEE ARTHROPLASTY;  Surgeon: Melrose Nakayama, MD;  Location: [facility];  Service: Orthopedics;  Laterality: Left;   UMBILICAL HERNIA REPAIR     Patient Active Problem List   Diagnosis Date Noted   COVID-19 08/09/2022   Back pain 06/23/2022   Urinary retention 06/23/2022   Allergic sinusitis 04/12/2022   Rotator cuff tendinitis, right 02/03/2022   Primary osteoarthritis of right hip 06/24/2021   Primary osteoarthritis of left knee 03/29/2021   Hamstring strain, left, initial encounter 02/26/2021   Degenerative tear of posterior horn of lateral meniscus  of left knee 01/29/2021   OA (osteoarthritis) of knee 08/16/2020   Neoplasm of uncertain behavior of skin 03/12/2020   Tubular adenoma of colon 10/17/2018   Hyperlipidemia 10/12/2018   Greater trochanteric bursitis of both hips 10/12/2018   Morbid obesity ([place]) 10/12/2018   Vitamin D deficiency 08/01/2017   Neurotic excoriations 07/27/2016   Family history of colon cancer requiring screening colonoscopy 05/16/2012   Prediabetes 05/16/2012   Tobacco abuse 07/20/2011   Hypogonadism male 07/20/2011   Essential hypertension, benign 06/29/2011    PCP: Dr Luetta Nutting  REFERRING PROVIDER: Dr Melrose Nakayama  REFERRING DIAG: Lt TKA  THERAPY DIAG:  Chronic pain of left knee  Abnormal gait  Muscle weakness (generalized)  Localized edema  Other symptoms and signs involving the musculoskeletal system  Stiffness of left knee, not elsewhere classified  Rationale for Evaluation and Treatment Rehabilitation  ONSET DATE: 09/20/22  SUBJECTIVE:   SUBJECTIVE STATEMENT: Seemed to have a reaction to the bandage adhesive so the skin around the incision is irritated. Working on exercises at home. Worked hard at home yesterday. Still having some pain but trying to take less pain medication,  See Dr Rhona Raider tomorrow.   PERTINENT HISTORY: Lt TKA 09/20/22 Rt THA 5/23; HTN; arthritis  PAIN:  Are you having pain? Yes:  NPRS scale: 0/10 Pain location: Lt knee  Pain description: sharp; aching; tight;  throbbing  Aggravating factors: movement Relieving factors: meds and elevation   PRECAUTIONS: post L TKA 09/20/22  WEIGHT BEARING RESTRICTIONS No  FALLS:  Has patient fallen in last 6 months? No  PATIENT GOALS use the [name] again and get around without walker or cane   OBJECTIVE:   LOWER EXTREMITY ROM:  Active Assistive ROM Right eval Left eval Left 10/23 Left 10/30 Left 10/19/22  Hip flexion 120 110     Hip extension 5 0     Hip abduction 26 32     Hip adduction        Hip internal rotation       Hip external rotation       Knee flexion 110 63 90 in sitting  100 sup 105 supine   Knee extension 0 -14 -25 extensor lag -10 prone -9 Supine heel propped on bolster  Ankle dorsiflexion       Ankle plantarflexion       Ankle inversion       Ankle eversion        (Blank rows = not tested)   TODAY'S TREATMENT: [facility] Adult PT Treatment:                   ________       DATE: 10/19/22:  Therapeutic exercise:  Nustep L5 x 12 min   Sitting    Sit to stand x 10 w/out UE assist  Supine Heel slides with strap x10 Quad set with bolster under ankle 1x10x3 sec overpressure by PT x 5 reps x 5 3 sec hold SAQ 2x10 last 10 w/PT overpressure hold at end range Prone PROM flexion w/strap 4-5 sec hold x 10  Quad set x10 for knee extension Standing Terminal knee ext pressing ball 10 sec 2x10  TKE green TB 10 sec x 10  Isometric knee extension PT resistance w/ TB Backwards walking 8 x 10' at counter Step up Lt 10 x 2 sets 6 in step  Knee flexion foot on 12 in step x 10 x 10 sec hold  Knee bends x 10  Side steps green TB above knee x 10 ft x 5 ea side   Manual therapy: Knee flex/ext PROM Grade II and III tibiofemoral mobs for knee flex/ext  Vaso: x10 min medium pressure, 34 deg  10/17/22 Therapeutic exercise:  Nustep L5 x 10 min   Sitting    Sit to stand x 5 w/ UE assist x 5 w/out UE assist  Supine Heel slides with strap x10 Quad set with bolster under ankle 1x10x3 sec overpressure by PT x 5 reps x 5 3 sec hold SAQ 2x10 last 10 w/PT overpressure hold at end range Prone PROM flexion w/strap 4-5 sec hold x 10  Quad set x10 for knee extension Standing Terminal knee ext pressing ball 10 sec 2x10  TKE green TB 10 sec x 10  Isometric knee extension PT resistance w/ TB Backwards walking 8 x 10' at counter  Manual therapy: Knee flex/ext PROM Grade II and III tibiofemoral mobs for knee flex/ext  Vaso: x10 min medium pressure, 34 deg   PATIENT  EDUCATION:  Education details: rationale for interventions, POC Person educated: Patient Education method: Explanation, Demonstration, Tactile cues, Verbal cues, handouts Education comprehension: verbalized understanding, returned demonstration, verbal cues required, tactile cues required, and needs further education   HOME EXERCISE PROGRAM: Access Code: [email or web address] URL: [email or web address] Date: 09/22/2022 Prepared by: Gillermo Murdoch  Exercises - Ankle Pumps in Elevation  -  2 x daily - 7 x weekly - 1 sets - 10-20 reps - Ankle Circles in Elevation  - 2 x daily - 7 x weekly - 1 sets - 10-15 reps - Supine Quad Set  - 2 x daily - 7 x weekly - 1 sets - 10 reps - 3 sec  hold - Small Range Straight Leg Raise  - 2 x daily - 7 x weekly - 1 sets - 10 reps - 5 sec  hold - Supine Hip Abduction  - 2 x daily - 7 x weekly - 1 sets - 10 reps - 3 sec  hold - Supine Heel Slide with Strap  - 2 x daily - 7 x weekly - 1 sets - 5-10 reps - 10 sec  hold - Seated Heel Slide  - 2 x daily - 7 x weekly - 1 sets - 5-10 reps - 10 sec  hold  ASSESSMENT:  CLINICAL IMPRESSION: Continued gradual progression with Lt knee rehab. Working on Lt knee ROM, mobility, strength. Knee extension continues to be most limited compared to flexion. Working on gait with cane.  OBJECTIVE IMPAIRMENTS Abnormal gait, decreased activity tolerance, decreased balance, decreased mobility, difficulty walking, decreased ROM, decreased strength, increased edema, impaired flexibility, postural dysfunction, and pain.   REHAB POTENTIAL: Good   GOALS: Goals reviewed with patient? Yes  SHORT TERM GOALS: Target date: 10/20/2022  Independent in initial HEP  Baseline: Goal status: INITIAL  2.  Increase AAROM Lt knee to (-)8 to (-)10 ext and 90 to 95 deg flexion  Baseline:  Goal status: INITIAL  3.  Independent in gait with rolling walker improved gait pattern and increased wt bearing Lt LE  Baseline:  Goal status:  INITIAL    LONG TERM GOALS: Target date: 11/17/2022   Increased AROM Lt knee to 0 deg extension and 100 degrees flexion Baseline:  Goal status: INITIAL  2.  Increase strength Lt hip and knee to 4+/5 to 5/5  Baseline:  Goal status: INITIAL  3.  Gait with least restrictive assistive device for community distance of at least 400-600 ft Baseline:  Goal status: INITIAL  4.  Independent in all transfers in home  Baseline:  Goal status: INITIAL  5.  Independent in ascending and descending 14 steps in home Baseline:  Goal status: INITIAL  6.  Independent in HEP  Baseline:  Goal status: INITIAL  7. Improve functional limitation score to 45  Goal status: INITIAL   PLAN: PT FREQUENCY: 2x/week  PT DURATION: 8 weeks  PLANNED INTERVENTIONS: Therapeutic exercises, Therapeutic activity, Neuromuscular re-education, Balance training, Gait training, Patient/Family education, Self Care, Joint mobilization, Stair training, DME instructions, Aquatic Therapy, Dry Needling, Electrical stimulation, Cryotherapy, Moist heat, Taping, Vasopneumatic device, Ultrasound, Ionotophoresis '4mg'$ /ml Dexamethasone, Manual therapy, and Re-evaluation  PLAN FOR NEXT SESSION:  continue knee rehab - working on ROM and strengthening; focus on gaining ext/flex.   Everardo All, PT, MPH  10/19/2022 11:09 AM

## 2022-10-21 DIAGNOSIS — Z471 Aftercare following joint replacement surgery: Secondary | ICD-10-CM | POA: Diagnosis not present

## 2022-10-21 DIAGNOSIS — Z96652 Presence of left artificial knee joint: Secondary | ICD-10-CM | POA: Diagnosis not present

## 2022-10-24 ENCOUNTER — Ambulatory Visit: Payer: Medicare Other | Admitting: Rehabilitative and Restorative Service Providers"

## 2022-10-24 ENCOUNTER — Encounter: Payer: Self-pay | Admitting: Rehabilitative and Restorative Service Providers"

## 2022-10-24 DIAGNOSIS — M25662 Stiffness of left knee, not elsewhere classified: Secondary | ICD-10-CM

## 2022-10-24 DIAGNOSIS — R29898 Other symptoms and signs involving the musculoskeletal system: Secondary | ICD-10-CM | POA: Diagnosis not present

## 2022-10-24 DIAGNOSIS — M25562 Pain in left knee: Secondary | ICD-10-CM | POA: Diagnosis not present

## 2022-10-24 DIAGNOSIS — M6281 Muscle weakness (generalized): Secondary | ICD-10-CM

## 2022-10-24 DIAGNOSIS — R269 Unspecified abnormalities of gait and mobility: Secondary | ICD-10-CM

## 2022-10-24 DIAGNOSIS — R600 Localized edema: Secondary | ICD-10-CM

## 2022-10-24 DIAGNOSIS — G8929 Other chronic pain: Secondary | ICD-10-CM

## 2022-10-24 NOTE — Therapy (Signed)
OUTPATIENT PHYSICAL THERAPY LOWER EXTREMITY TREATMENT and MEDICARE 10th VIsit note   Progress Note Reporting Period 09/22/22 to 10/24/22  See note below for Objective Data and Assessment of Progress/Goals.  Erik Estrada is progressing well with knee rehab     Patient Name: Erik Estrada MRN: [id number] DOB:04-Sep-1951, 71 y.o., male Today's Date: 10/24/2022   PT End of Session - 10/24/22 1100     Visit Number 10    Number of Visits 16    Date for PT Re-Evaluation 11/17/22    Authorization Type Medicare    Authorization - Visit Number 10    Progress Note Due on Visit 10    PT Start Time 2130    PT Stop Time 8657    PT Time Calculation (min) 55 min    Activity Tolerance Patient tolerated treatment well                Past Medical History:  Diagnosis Date   Anxiety    Arthritis    Essential hypertension, benign 06/29/2011   Hemorrhoids 08/01/2018   History of kidney stones    Morbid obesity ([place]) 10/12/2018   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  Plan for 5-year recheck.   Vitamin D deficiency 08/01/2017   Past Surgical History:  Procedure Laterality Date   KIDNEY STONE SURGERY     SHOULDER SURGERY  December 2010   Left   TOTAL HIP ARTHROPLASTY Right 04/19/2022   Procedure: RIGHT TOTAL HIP ARTHROPLASTY ANTERIOR APPROACH;  Surgeon: Melrose Nakayama, MD;  Location: [facility];  Service: Orthopedics;  Laterality: Right;   TOTAL KNEE ARTHROPLASTY Left 09/20/2022   Procedure: LEFT TOTAL KNEE ARTHROPLASTY;  Surgeon: Melrose Nakayama, MD;  Location: [facility];  Service: Orthopedics;  Laterality: Left;   UMBILICAL HERNIA REPAIR     Patient Active Problem List   Diagnosis Date Noted   COVID-19 08/09/2022   Back pain 06/23/2022   Urinary retention 06/23/2022   Allergic sinusitis 04/12/2022   Rotator cuff tendinitis, right 02/03/2022   Primary  osteoarthritis of right hip 06/24/2021   Primary osteoarthritis of left knee 03/29/2021   Hamstring strain, left, initial encounter 02/26/2021   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 01/29/2021   OA (osteoarthritis) of knee 08/16/2020   Neoplasm of uncertain behavior of skin 03/12/2020   Tubular adenoma of colon 10/17/2018   Hyperlipidemia 10/12/2018   Greater trochanteric bursitis of both hips 10/12/2018   Morbid obesity ([place]) 10/12/2018   Vitamin D deficiency 08/01/2017   Neurotic excoriations 07/27/2016   Family history of colon cancer requiring screening colonoscopy 05/16/2012   Prediabetes 05/16/2012   Tobacco abuse 07/20/2011   Hypogonadism male 07/20/2011   Essential hypertension, benign 06/29/2011    PCP: Dr Luetta Nutting  REFERRING PROVIDER: Dr Melrose Nakayama  REFERRING DIAG: Lt TKA  THERAPY DIAG:  Chronic pain of left knee  Abnormal gait  Muscle weakness (generalized)  Localized edema  Other symptoms and signs involving the musculoskeletal system  Stiffness of left knee, not elsewhere classified  Rationale for Evaluation and Treatment Rehabilitation  ONSET DATE: 09/20/22  SUBJECTIVE:   SUBJECTIVE STATEMENT: Increased pain in the Lt knee today. Not sure why. Saw Dr Jerald Kief PA Mitzi Hansen. He said PA wants his knee to "go back further" when he sees more ROM in December.    PERTINENT HISTORY: Lt TKA 09/20/22 Rt THA  5/23; HTN; arthritis  PAIN:  Are you having pain? Yes:  NPRS scale: 4/10 Pain location: Lt knee  Pain description: sharp; aching; tight; throbbing  Aggravating factors: movement Relieving factors: meds and elevation   PRECAUTIONS: post L TKA 09/20/22  WEIGHT BEARING RESTRICTIONS No  FALLS:  Has patient fallen in last 6 months? No  PATIENT GOALS use the [name] again and get around without walker or cane   OBJECTIVE:   LOWER EXTREMITY ROM:  Active Assistive ROM Right eval Left eval Left 10/23  Left 10/30 Left 10/19/22  Hip flexion 120 110     Hip extension 5 0     Hip abduction 26 32     Hip adduction       Hip internal rotation       Hip external rotation       Knee flexion 110 63 90 in sitting  100 sup 105 supine   Knee extension 0 -14 -25 extensor lag -10 prone -9 Supine heel propped on bolster  Ankle dorsiflexion       Ankle plantarflexion       Ankle inversion       Ankle eversion        (Blank rows = not tested)   TODAY'S TREATMENT: [facility] Adult PT Treatment:                   ________       Date: 10/24/22 Therapeutic exercise:  Nustep L6 x 12 min   Sitting    Sit to stand x 10 w/out UE assist  Supine Heel slides with strap x10 Quad set with bolster under ankle 1x10x3 sec overpressure by PT x 5 reps x 5 3 sec hold SAQ 2x10 last 10 w/PT overpressure hold at end range Prone PROM flexion w/strap 4-5 sec hold x 10  Quad set x10 for knee extension Standing Terminal knee ext pressing ball 10 sec 2x10  TKE black TB 10 sec x 10  Standing heel on 12 inch step pressing into knee extension x 8 reps  Isometric knee extension PT resistance w/ TB Quad set heel resting on 6 in foam roll 5 sec x 5 Quad set to [employer] working on [employer] and control with eccentric lowering 5 sec x 10 reps  Knee flexion foot on 12 in step x 10 x 10 sec hold  Knee bends x 10  Side steps black TB above knee x 10 ft x 5 ea side   Manual therapy: Knee flex/ext PROM Grade II and III tibiofemoral mobs for knee flex/ext  Vaso: x10 min medium pressure, 34 deg    DATE: 10/19/22:  Therapeutic exercise:  Nustep L5 x 12 min   Sitting    Sit to stand x 10 w/out UE assist  Supine Heel slides with strap x10 Quad set with bolster under ankle 1x10x3 sec overpressure by PT x 5 reps x 5 3 sec hold SAQ 2x10 last 10 w/PT overpressure hold at end range Prone PROM flexion w/strap 4-5 sec hold x 10  Quad set x10 for knee extension Standing Terminal knee ext pressing ball 10 sec 2x10  TKE green TB 10  sec x 10  Isometric knee extension PT resistance w/ TB Backwards walking 8 x 10' at counter Step up Lt 10 x 2 sets 6 in step  Knee flexion foot on 12 in step x 10 x 10 sec hold  Knee bends x 10  Side steps green TB above knee x  10 ft x 5 ea side   Manual therapy: Knee flex/ext PROM Grade II and III tibiofemoral mobs for knee flex/ext  Vaso: x10 min medium pressure, 34 deg  PATIENT EDUCATION:  Education details: rationale for interventions, POC Person educated: Patient Education method: Explanation, Demonstration, Tactile cues, Verbal cues, handouts Education comprehension: verbalized understanding, returned demonstration, verbal cues required, tactile cues required, and needs further education   HOME EXERCISE PROGRAM: Access Code: [email or web address] URL: [email or web address] Date: 09/22/2022 Prepared by: Gillermo Murdoch  Exercises - Ankle Pumps in Elevation  - 2 x daily - 7 x weekly - 1 sets - 10-20 reps - Ankle Circles in Elevation  - 2 x daily - 7 x weekly - 1 sets - 10-15 reps - Supine Quad Set  - 2 x daily - 7 x weekly - 1 sets - 10 reps - 3 sec  hold - Small Range Straight Leg Raise  - 2 x daily - 7 x weekly - 1 sets - 10 reps - 5 sec  hold - Supine Hip Abduction  - 2 x daily - 7 x weekly - 1 sets - 10 reps - 3 sec  hold - Supine Heel Slide with Strap  - 2 x daily - 7 x weekly - 1 sets - 5-10 reps - 10 sec  hold - Seated Heel Slide  - 2 x daily - 7 x weekly - 1 sets - 5-10 reps - 10 sec  hold  ASSESSMENT:  CLINICAL IMPRESSION: Continued gradual progression with Lt knee rehab. Working on Lt knee ROM, mobility, strength. Knee extension continues to be most limited compared to flexion. Continues to demonstrate extensor lag. Will add water exercises this week. Working on gait with cane.  OBJECTIVE IMPAIRMENTS Abnormal gait, decreased activity tolerance, decreased balance, decreased mobility, difficulty walking, decreased ROM, decreased strength, increased edema, impaired  flexibility, postural dysfunction, and pain.   REHAB POTENTIAL: Good   GOALS: Goals reviewed with patient? Yes  SHORT TERM GOALS: Target date: 10/20/2022  Independent in initial HEP  Baseline: Goal status: INITIAL  2.  Increase AAROM Lt knee to (-)8 to (-)10 ext and 90 to 95 deg flexion  Baseline:  Goal status: INITIAL  3.  Independent in gait with rolling walker improved gait pattern and increased wt bearing Lt LE  Baseline:  Goal status: INITIAL    LONG TERM GOALS: Target date: 11/17/2022   Increased AROM Lt knee to 0 deg extension and 100 degrees flexion Baseline:  Goal status: INITIAL  2.  Increase strength Lt hip and knee to 4+/5 to 5/5  Baseline:  Goal status: INITIAL  3.  Gait with least restrictive assistive device for community distance of at least 400-600 ft Baseline:  Goal status: INITIAL  4.  Independent in all transfers in home  Baseline:  Goal status: INITIAL  5.  Independent in ascending and descending 14 steps in home Baseline:  Goal status: INITIAL  6.  Independent in HEP  Baseline:  Goal status: INITIAL  7. Improve functional limitation score to 45  Goal status: INITIAL   PLAN: PT FREQUENCY: 2x/week  PT DURATION: 8 weeks  PLANNED INTERVENTIONS: Therapeutic exercises, Therapeutic activity, Neuromuscular re-education, Balance training, Gait training, Patient/Family education, Self Care, Joint mobilization, Stair training, DME instructions, Aquatic Therapy, Dry Needling, Electrical stimulation, Cryotherapy, Moist heat, Taping, Vasopneumatic device, Ultrasound, Ionotophoresis '4mg'$ /ml Dexamethasone, Manual therapy, and Re-evaluation  PLAN FOR NEXT SESSION:  continue knee rehab - working on ROM and strengthening; focus on gaining ext/flex.  Everardo All, PT, MPH  10/24/2022 11:48 AM

## 2022-10-26 ENCOUNTER — Ambulatory Visit: Payer: Medicare Other | Admitting: Rehabilitative and Restorative Service Providers"

## 2022-10-26 ENCOUNTER — Encounter: Payer: Self-pay | Admitting: Rehabilitative and Restorative Service Providers"

## 2022-10-26 DIAGNOSIS — R29898 Other symptoms and signs involving the musculoskeletal system: Secondary | ICD-10-CM | POA: Diagnosis not present

## 2022-10-26 DIAGNOSIS — G8929 Other chronic pain: Secondary | ICD-10-CM

## 2022-10-26 DIAGNOSIS — M6281 Muscle weakness (generalized): Secondary | ICD-10-CM | POA: Diagnosis not present

## 2022-10-26 DIAGNOSIS — R269 Unspecified abnormalities of gait and mobility: Secondary | ICD-10-CM

## 2022-10-26 DIAGNOSIS — M25662 Stiffness of left knee, not elsewhere classified: Secondary | ICD-10-CM

## 2022-10-26 DIAGNOSIS — R600 Localized edema: Secondary | ICD-10-CM

## 2022-10-26 DIAGNOSIS — M25562 Pain in left knee: Secondary | ICD-10-CM | POA: Diagnosis not present

## 2022-10-26 NOTE — Therapy (Signed)
OUTPATIENT PHYSICAL THERAPY LOWER EXTREMITY TREATMENT    Progress Note Reporting Period 09/22/22 to 10/24/22  See note below for Objective Data and Assessment of Progress/Goals.  Erik Estrada is progressing well with knee rehab     Patient Name: Erik Estrada MRN: [id number] DOB:[birth year], 71 y.o., male Today's Date: 10/26/2022   PT End of Session - 10/26/22 1109     Visit Number 11    Number of Visits 16    Date for PT Re-Evaluation 11/17/22    Authorization Type Medicare    Authorization - Visit Number 11    Progress Note Due on Visit 20    PT Start Time 1100    PT Stop Time 1150    PT Time Calculation (min) 50 min    Activity Tolerance Patient tolerated treatment well                Past Medical History:  Diagnosis Date   Anxiety    Arthritis    Essential hypertension, benign 06/29/2011   Hemorrhoids 08/01/2018   History of kidney stones    Morbid obesity ([place]) 10/12/2018   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  Plan for 5-year recheck.   Vitamin D deficiency 08/01/2017   Past Surgical History:  Procedure Laterality Date   KIDNEY STONE SURGERY     SHOULDER SURGERY  December 2010   Left   TOTAL HIP ARTHROPLASTY Right 04/19/2022   Procedure: RIGHT TOTAL HIP ARTHROPLASTY ANTERIOR APPROACH;  Surgeon: Melrose Nakayama, MD;  Location: [facility];  Service: Orthopedics;  Laterality: Right;   TOTAL KNEE ARTHROPLASTY Left 09/20/2022   Procedure: LEFT TOTAL KNEE ARTHROPLASTY;  Surgeon: Melrose Nakayama, MD;  Location: [facility];  Service: Orthopedics;  Laterality: Left;   UMBILICAL HERNIA REPAIR     Patient Active Problem List   Diagnosis Date Noted   COVID-19 08/09/2022   Back pain 06/23/2022   Urinary retention 06/23/2022   Allergic sinusitis 04/12/2022   Rotator cuff tendinitis, right 02/03/2022   Primary osteoarthritis of right hip  06/24/2021   Primary osteoarthritis of left knee 03/29/2021   Hamstring strain, left, initial encounter 02/26/2021   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 01/29/2021   OA (osteoarthritis) of knee 08/16/2020   Neoplasm of uncertain behavior of skin 03/12/2020   Tubular adenoma of colon 10/17/2018   Hyperlipidemia 10/12/2018   Greater trochanteric bursitis of both hips 10/12/2018   Morbid obesity ([place]) 10/12/2018   Vitamin D deficiency 08/01/2017   Neurotic excoriations 07/27/2016   Family history of colon cancer requiring screening colonoscopy 05/16/2012   Prediabetes 05/16/2012   Tobacco abuse 07/20/2011   Hypogonadism male 07/20/2011   Essential hypertension, benign 06/29/2011    PCP: Dr Luetta Nutting  REFERRING PROVIDER: Dr Melrose Nakayama  REFERRING DIAG: Lt TKA  THERAPY DIAG:  Chronic pain of left knee  Abnormal gait  Muscle weakness (generalized)  Localized edema  Other symptoms and signs involving the musculoskeletal system  Stiffness of left knee, not elsewhere classified  Rationale for Evaluation and Treatment Rehabilitation  ONSET DATE: 09/20/22  SUBJECTIVE:   SUBJECTIVE STATEMENT: Has had a couple of rough days with difficulty sleeping last night. Has been working in the pool once since last visit. He felt better after that pool, knee is looser.  PERTINENT HISTORY: Lt TKA 09/20/22 Rt THA 5/23; HTN; arthritis  PAIN:  Are  you having pain? Yes:  NPRS scale: 4/10 Pain location: Lt knee  Pain description: sharp; aching; tight; throbbing  Aggravating factors: movement Relieving factors: meds and elevation   PRECAUTIONS: post L TKA 09/20/22  WEIGHT BEARING RESTRICTIONS No  FALLS:  Has patient fallen in last 6 months? No  PATIENT GOALS use the [name] again and get around without walker or cane   OBJECTIVE:   LOWER EXTREMITY ROM:  Active Assistive ROM Right eval Left eval Left 10/23 Left 10/30 Left 10/19/22   Hip flexion 120 110     Hip extension 5 0     Hip abduction 26 32     Hip adduction       Hip internal rotation       Hip external rotation       Knee flexion 110 63 90 in sitting  100 sup 105 supine   Knee extension 0 -14 -25 extensor lag -10 prone -9 Supine heel propped on bolster  Ankle dorsiflexion       Ankle plantarflexion       Ankle inversion       Ankle eversion        (Blank rows = not tested)   TODAY'S TREATMENT: [facility] Adult PT Treatment:                   ________       Date : 10/26/22 Therapeutic exercise:  Added 5/8 in heel lift Rt shoe to improve leg length symmetry in standing   Nustep L6 x 15 min   Sitting    Sit to stand x 10 x 2 sets w/out UE assist  Supine Heel slides with strap x10 Quad set with bolster under ankle 1x10x3 sec overpressure by PT x 5 reps x 5 3 sec hold SAQ 2x10 last 10 w/PT overpressure hold at end range Prone PROM flexion w/strap 4-5 sec hold x 10  Quad set x 10 x 2 sets for knee extension Hip extension 10 x 2 work on maintaining knee ext  Standing Terminal knee ext pressing ball 10 sec 2x10  Working on Lt knee extension with PT assist 10 x 5-10 sec with PT assisting pt into knee extension Standing w/ knee ext working on stepping fwd with Rt LE    Manual therapy: Knee ext PROM Myofacial release with massage stick Lt HS&calf  Grade II and III tibiofemoral mobs for knee flex/ext  Vaso: x10 min medium pressure, 34 deg   Date: 10/24/22 Therapeutic exercise:  Nustep L6 x 12 min   Sitting    Sit to stand x 10 w/out UE assist  Supine Heel slides with strap x10 Quad set with bolster under ankle 1x10x3 sec overpressure by PT x 5 reps x 5 3 sec hold SAQ 2x10 last 10 w/PT overpressure hold at end range Prone PROM flexion w/strap 4-5 sec hold x 10  Quad set x10 for knee extension Standing Terminal knee ext pressing ball 10 sec 2x10  TKE black TB 10 sec x 10  Standing heel on 12 inch step pressing into knee extension x 8  reps  Isometric knee extension PT resistance w/ TB Quad set heel resting on 6 in foam roll 5 sec x 5 Quad set to [employer] working on [employer] and control with eccentric lowering 5 sec x 10 reps  Knee flexion foot on 12 in step x 10 x 10 sec hold  Knee bends x 10  Side steps black TB above knee x  10 ft x 5 ea side   Manual therapy: Knee flex/ext PROM Grade II and III tibiofemoral mobs for knee flex/ext  Vaso: x10 min medium pressure, 34 deg   PATIENT EDUCATION:  Education details: rationale for interventions, POC Person educated: Patient Education method: Explanation, Demonstration, Tactile cues, Verbal cues, handouts Education comprehension: verbalized understanding, returned demonstration, verbal cues required, tactile cues required, and needs further education   HOME EXERCISE PROGRAM: Access Code: [email or web address] URL: [email or web address] Date: 09/22/2022 Prepared by: Gillermo Murdoch  Exercises - Ankle Pumps in Elevation  - 2 x daily - 7 x weekly - 1 sets - 10-20 reps - Ankle Circles in Elevation  - 2 x daily - 7 x weekly - 1 sets - 10-15 reps - Supine Quad Set  - 2 x daily - 7 x weekly - 1 sets - 10 reps - 3 sec  hold - Small Range Straight Leg Raise  - 2 x daily - 7 x weekly - 1 sets - 10 reps - 5 sec  hold - Supine Hip Abduction  - 2 x daily - 7 x weekly - 1 sets - 10 reps - 3 sec  hold - Supine Heel Slide with Strap  - 2 x daily - 7 x weekly - 1 sets - 5-10 reps - 10 sec  hold - Seated Heel Slide  - 2 x daily - 7 x weekly - 1 sets - 5-10 reps - 10 sec  hold  ASSESSMENT:  CLINICAL IMPRESSION: Continued Lt knee pain - variable intensity. Erik Estrada is gradually progressing with Lt knee rehab. He has had slight Lt knee flexion with functional activities including standing and walking for months or years prior to knee replacement which makes regaining full knee extension difficult. Working on Lt knee ROM, mobility, strength. Knee extension continues to be most limited compared to  flexion. Continues to demonstrate extensor lag. Will continue water exercises each week.   OBJECTIVE IMPAIRMENTS Abnormal gait, decreased activity tolerance, decreased balance, decreased mobility, difficulty walking, decreased ROM, decreased strength, increased edema, impaired flexibility, postural dysfunction, and pain.   REHAB POTENTIAL: Good   GOALS: Goals reviewed with patient? Yes  SHORT TERM GOALS: Target date: 10/20/2022  Independent in initial HEP  Baseline: Goal status: INITIAL  2.  Increase AAROM Lt knee to (-)8 to (-)10 ext and 90 to 95 deg flexion  Baseline:  Goal status: INITIAL  3.  Independent in gait with rolling walker improved gait pattern and increased wt bearing Lt LE  Baseline:  Goal status: INITIAL    LONG TERM GOALS: Target date: 11/17/2022   Increased AROM Lt knee to 0 deg extension and 100 degrees flexion Baseline:  Goal status: INITIAL  2.  Increase strength Lt hip and knee to 4+/5 to 5/5  Baseline:  Goal status: INITIAL  3.  Gait with least restrictive assistive device for community distance of at least 400-600 ft Baseline:  Goal status: INITIAL  4.  Independent in all transfers in home  Baseline:  Goal status: INITIAL  5.  Independent in ascending and descending 14 steps in home Baseline:  Goal status: INITIAL  6.  Independent in HEP  Baseline:  Goal status: INITIAL  7. Improve functional limitation score to 45  Goal status: INITIAL   PLAN: PT FREQUENCY: 2x/week  PT DURATION: 8 weeks  PLANNED INTERVENTIONS: Therapeutic exercises, Therapeutic activity, Neuromuscular re-education, Balance training, Gait training, Patient/Family education, Self Care, Joint mobilization, Stair training, DME instructions, Aquatic Therapy, Dry Needling, Electrical stimulation, Cryotherapy,  Moist heat, Taping, Vasopneumatic device, Ultrasound, Ionotophoresis '4mg'$ /ml Dexamethasone, Manual therapy, and Re-evaluation  PLAN FOR NEXT SESSION:  continue knee  rehab - working on ROM and strengthening; focus on gaining ext/flex.   Everardo All, PT, MPH  10/26/2022 11:10 AM

## 2022-10-31 ENCOUNTER — Encounter: Payer: Self-pay | Admitting: Rehabilitative and Restorative Service Providers"

## 2022-10-31 ENCOUNTER — Ambulatory Visit: Payer: Medicare Other | Admitting: Rehabilitative and Restorative Service Providers"

## 2022-10-31 DIAGNOSIS — R29898 Other symptoms and signs involving the musculoskeletal system: Secondary | ICD-10-CM | POA: Diagnosis not present

## 2022-10-31 DIAGNOSIS — M25562 Pain in left knee: Secondary | ICD-10-CM | POA: Diagnosis not present

## 2022-10-31 DIAGNOSIS — R600 Localized edema: Secondary | ICD-10-CM | POA: Diagnosis not present

## 2022-10-31 DIAGNOSIS — M25662 Stiffness of left knee, not elsewhere classified: Secondary | ICD-10-CM

## 2022-10-31 DIAGNOSIS — R269 Unspecified abnormalities of gait and mobility: Secondary | ICD-10-CM | POA: Diagnosis not present

## 2022-10-31 DIAGNOSIS — M6281 Muscle weakness (generalized): Secondary | ICD-10-CM | POA: Diagnosis not present

## 2022-10-31 DIAGNOSIS — G8929 Other chronic pain: Secondary | ICD-10-CM | POA: Diagnosis not present

## 2022-10-31 NOTE — Therapy (Signed)
OUTPATIENT PHYSICAL THERAPY LOWER EXTREMITY TREATMENT    Progress Note Reporting Period 09/22/22 to 10/24/22  See note below for Objective Data and Assessment of Progress/Goals.  Erik Estrada is progressing well with knee rehab     Patient Name: Erik Estrada MRN: [id number] DOB:03-08-1951, 71 y.o., male Today's Date: 10/31/2022   PT End of Session - 10/31/22 1034     Visit Number 12    Number of Visits 16    Date for PT Re-Evaluation 11/17/22    Authorization Type Medicare    Authorization - Visit Number 12    Progress Note Due on Visit 20    PT Start Time 1034    PT Stop Time 1125    PT Time Calculation (min) 51 min    Activity Tolerance Patient tolerated treatment well    Behavior During Therapy [facility] for tasks assessed/performed                Past Medical History:  Diagnosis Date   Anxiety    Arthritis    Essential hypertension, benign 06/29/2011   Hemorrhoids 08/01/2018   History of kidney stones    Morbid obesity ([place]) 10/12/2018   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  Plan for 5-year recheck.   Vitamin D deficiency 08/01/2017   Past Surgical History:  Procedure Laterality Date   KIDNEY STONE SURGERY     SHOULDER SURGERY  December 2010   Left   TOTAL HIP ARTHROPLASTY Right 04/19/2022   Procedure: RIGHT TOTAL HIP ARTHROPLASTY ANTERIOR APPROACH;  Surgeon: Melrose Nakayama, MD;  Location: [facility];  Service: Orthopedics;  Laterality: Right;   TOTAL KNEE ARTHROPLASTY Left 09/20/2022   Procedure: LEFT TOTAL KNEE ARTHROPLASTY;  Surgeon: Melrose Nakayama, MD;  Location: [facility];  Service: Orthopedics;  Laterality: Left;   UMBILICAL HERNIA REPAIR     Patient Active Problem List   Diagnosis Date Noted   COVID-19 08/09/2022   Back pain 06/23/2022   Urinary retention 06/23/2022   Allergic sinusitis 04/12/2022   Rotator cuff  tendinitis, right 02/03/2022   Primary osteoarthritis of right hip 06/24/2021   Primary osteoarthritis of left knee 03/29/2021   Hamstring strain, left, initial encounter 02/26/2021   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 01/29/2021   OA (osteoarthritis) of knee 08/16/2020   Neoplasm of uncertain behavior of skin 03/12/2020   Tubular adenoma of colon 10/17/2018   Hyperlipidemia 10/12/2018   Greater trochanteric bursitis of both hips 10/12/2018   Morbid obesity ([place]) 10/12/2018   Vitamin D deficiency 08/01/2017   Neurotic excoriations 07/27/2016   Family history of colon cancer requiring screening colonoscopy 05/16/2012   Prediabetes 05/16/2012   Tobacco abuse 07/20/2011   Hypogonadism male 07/20/2011   Essential hypertension, benign 06/29/2011    PCP: Dr Luetta Nutting  REFERRING PROVIDER: Dr Melrose Nakayama  REFERRING DIAG: Lt TKA  THERAPY DIAG:  Chronic pain of left knee  Abnormal gait  Muscle weakness (generalized)  Localized edema  Other symptoms and signs involving the musculoskeletal system  Stiffness of left knee, not elsewhere classified  Rationale for Evaluation and Treatment Rehabilitation  ONSET DATE: 09/20/22  SUBJECTIVE:   SUBJECTIVE STATEMENT: Thinks the heel lift helps with his balance a little bit. He has been wearing it some at home. He has been exercising in the water some in the past week.    PERTINENT HISTORY:  Lt TKA 09/20/22 Rt THA 5/23; HTN; arthritis  PAIN:  Are you having pain? Yes:  NPRS scale: 2/10 Pain location: Lt knee  Pain description: sharp; aching; tight; throbbing  Aggravating factors: movement Relieving factors: meds and elevation   PRECAUTIONS: post L TKA 09/20/22  WEIGHT BEARING RESTRICTIONS No  FALLS:  Has patient fallen in last 6 months? No  PATIENT GOALS use the [name] again and get around without walker or cane   OBJECTIVE:   LOWER EXTREMITY ROM:  Active Assistive ROM  Right eval Left eval Left 10/23 Left 10/30 Left 10/19/22  Hip flexion 120 110     Hip extension 5 0     Hip abduction 26 32     Hip adduction       Hip internal rotation       Hip external rotation       Knee flexion 110 63 90 in sitting  100 sup 105 supine   Knee extension 0 -14 -25 extensor lag -10 prone -9 Supine heel propped on bolster  Ankle dorsiflexion       Ankle plantarflexion       Ankle inversion       Ankle eversion        (Blank rows = not tested)   TODAY'S TREATMENT: [facility] Adult PT Treatment:                   ________       Date : 10/31/22 Therapeutic exercise:  Continues to use 5/8 in heel lift Rt shoe to improve leg length symmetry in standing   Nustep L7 UE 9 seat 7 x 12 min   Sitting:   Sit to stand x 10 x 2 sets w/out UE assist  Supine Heel slides with strap x10 Quad set with bolster under ankle 1x10x3 sec overpressure by PT x 5 reps x 5 3 sec hold SAQ 2x10 last 10 w/PT overpressure hold at end range Bridging double legs x 10 x 5 sec hold  Half bridging Lt LE  x 10 x 5 sec x 2 sets  Prone Quad set x 10 x 2 sets for knee extension Hip extension 10 x 2 work on maintaining knee ext  Standing Step up 6 in step Lt LE x 10 x 2 sets  Standing Lt foot on 12 inch step pushing back to work on knee extension x 5 Standing Lt foot on 12 inch pushing fwd to work on knee flexion x 5  Mini knee bends x 10 x 2 sets UE assist as needed for balance  Heel raises 10 x 2 sets  Working on Lt knee extension with PT assist 10 x 5-10 sec with PT assisting pt into knee extension Standing w/knee ext working on stepping fwd with Rt LE    Manual therapy: Knee ext PROM Deep tissue work and trigger point work through [employer] and calf - pt prone Grade II and III tibiofemoral mobs for knee flex/ext  Vaso: x10 min medium pressure, 34 deg  10/26/22 Therapeutic exercise:  Added 5/8 in heel lift Rt shoe to improve leg length symmetry in standing   Nustep L6 x 15 min    Sitting    Sit to stand x 10 x 2 sets w/out UE assist  Supine Heel slides with strap x10 Quad set with bolster under ankle 1x10x3 sec overpressure by PT x 5 reps x 5 3 sec hold SAQ 2x10 last 10 w/PT overpressure hold at end  range Prone PROM flexion w/strap 4-5 sec hold x 10  Quad set x 10 x 2 sets for knee extension Hip extension 10 x 2 work on maintaining knee ext  Standing Terminal knee ext pressing ball 10 sec 2x10  Working on Lt knee extension with PT assist 10 x 5-10 sec with PT assisting pt into knee extension Standing w/ knee ext working on stepping fwd with Rt LE    Manual therapy: Knee ext PROM Myofacial release with massage stick Lt HS&calf  Grade II and III tibiofemoral mobs for knee flex/ext  Vaso: x10 min medium pressure, 34 deg   PATIENT EDUCATION:  Education details: rationale for interventions, POC Person educated: Patient Education method: Explanation, Demonstration, Tactile cues, Verbal cues, handouts Education comprehension: verbalized understanding, returned demonstration, verbal cues required, tactile cues required, and needs further education   HOME EXERCISE PROGRAM: Access Code: [email or web address] URL: [email or web address] Date: 09/22/2022 Prepared by: Gillermo Murdoch  Exercises - Ankle Pumps in Elevation  - 2 x daily - 7 x weekly - 1 sets - 10-20 reps - Ankle Circles in Elevation  - 2 x daily - 7 x weekly - 1 sets - 10-15 reps - Supine Quad Set  - 2 x daily - 7 x weekly - 1 sets - 10 reps - 3 sec  hold - Small Range Straight Leg Raise  - 2 x daily - 7 x weekly - 1 sets - 10 reps - 5 sec  hold - Supine Hip Abduction  - 2 x daily - 7 x weekly - 1 sets - 10 reps - 3 sec  hold - Supine Heel Slide with Strap  - 2 x daily - 7 x weekly - 1 sets - 5-10 reps - 10 sec  hold - Seated Heel Slide  - 2 x daily - 7 x weekly - 1 sets - 5-10 reps - 10 sec  hold  ASSESSMENT:  CLINICAL IMPRESSION: 10/31/22: Continued tightness especially in Lt knee  extension. He may benefit from trial of DN to hamstrings and calf. He has continued pain - variable intensity. Erik Estrada is gradually progressing with Lt knee rehab. He has had slight Lt knee flexion with functional activities including standing and walking for months or years prior to knee replacement which makes regaining full knee extension difficult. Working on Lt knee ROM, mobility, strength. Knee extension continues to be most limited compared to flexion. Continues to demonstrate extensor lag. Erik Estrada will continue water exercises each week independently.   OBJECTIVE IMPAIRMENTS Abnormal gait, decreased activity tolerance, decreased balance, decreased mobility, difficulty walking, decreased ROM, decreased strength, increased edema, impaired flexibility, postural dysfunction, and pain.   REHAB POTENTIAL: Good   GOALS: Goals reviewed with patient? Yes  SHORT TERM GOALS: Target date: 10/20/2022  Independent in initial HEP  Baseline: Goal status: INITIAL  2.  Increase AAROM Lt knee to (-)8 to (-)10 ext and 90 to 95 deg flexion  Baseline:  Goal status: INITIAL  3.  Independent in gait with rolling walker improved gait pattern and increased wt bearing Lt LE  Baseline:  Goal status: INITIAL    LONG TERM GOALS: Target date: 11/17/2022   Increased AROM Lt knee to 0 deg extension and 100 degrees flexion Baseline:  Goal status: INITIAL  2.  Increase strength Lt hip and knee to 4+/5 to 5/5  Baseline:  Goal status: INITIAL  3.  Gait with least restrictive assistive device for community distance of at least 400-600 ft Baseline:  Goal status: INITIAL  4.  Independent in all transfers in home  Baseline:  Goal status: INITIAL  5.  Independent in ascending and descending 14 steps in home Baseline:  Goal status: INITIAL  6.  Independent in HEP  Baseline:  Goal status: INITIAL  7. Improve functional limitation score to 45  Goal status: INITIAL   PLAN: PT FREQUENCY: 2x/week  PT  DURATION: 8 weeks  PLANNED INTERVENTIONS: Therapeutic exercises, Therapeutic activity, Neuromuscular re-education, Balance training, Gait training, Patient/Family education, Self Care, Joint mobilization, Stair training, DME instructions, Aquatic Therapy, Dry Needling, Electrical stimulation, Cryotherapy, Moist heat, Taping, Vasopneumatic device, Ultrasound, Ionotophoresis '4mg'$ /ml Dexamethasone, Manual therapy, and Re-evaluation  PLAN FOR NEXT SESSION:  continue knee rehab - working on ROM and strengthening; focus on gaining ext/flex. Provided info on DN - consider trial of DN Lt hamstrings and calf  Jonta Gastineau Nilda Simmer, PT, MPH  10/31/2022 10:35 AM

## 2022-11-02 ENCOUNTER — Encounter: Payer: Self-pay | Admitting: Rehabilitative and Restorative Service Providers"

## 2022-11-02 ENCOUNTER — Ambulatory Visit: Payer: Medicare Other | Admitting: Rehabilitative and Restorative Service Providers"

## 2022-11-02 DIAGNOSIS — M25662 Stiffness of left knee, not elsewhere classified: Secondary | ICD-10-CM

## 2022-11-02 DIAGNOSIS — M6281 Muscle weakness (generalized): Secondary | ICD-10-CM

## 2022-11-02 DIAGNOSIS — G8929 Other chronic pain: Secondary | ICD-10-CM

## 2022-11-02 DIAGNOSIS — R269 Unspecified abnormalities of gait and mobility: Secondary | ICD-10-CM

## 2022-11-02 DIAGNOSIS — R600 Localized edema: Secondary | ICD-10-CM | POA: Diagnosis not present

## 2022-11-02 DIAGNOSIS — R29898 Other symptoms and signs involving the musculoskeletal system: Secondary | ICD-10-CM

## 2022-11-02 DIAGNOSIS — M25562 Pain in left knee: Secondary | ICD-10-CM | POA: Diagnosis not present

## 2022-11-02 NOTE — Therapy (Signed)
OUTPATIENT PHYSICAL THERAPY LOWER EXTREMITY TREATMENT    Progress Note Reporting Period 09/22/22 to 10/24/22  See note below for Objective Data and Assessment of Progress/Goals.  Erik Estrada is progressing well with knee rehab     Patient Name: Erik Estrada MRN: [id number] DOB:[birth year], 71 y.o., male Today's Date: 11/02/2022   PT End of Session - 11/02/22 1057     Visit Number 13    Date for PT Re-Evaluation 11/17/22    Authorization Type Medicare    Authorization - Visit Number 13    Progress Note Due on Visit 20    PT Start Time 7001    PT Stop Time 7494    PT Time Calculation (min) 53 min    Activity Tolerance Patient tolerated treatment well                Past Medical History:  Diagnosis Date   Anxiety    Arthritis    Essential hypertension, benign 06/29/2011   Hemorrhoids 08/01/2018   History of kidney stones    Morbid obesity ([place]) 10/12/2018   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  Plan for 5-year recheck.   Vitamin D deficiency 08/01/2017   Past Surgical History:  Procedure Laterality Date   KIDNEY STONE SURGERY     SHOULDER SURGERY  December 2010   Left   TOTAL HIP ARTHROPLASTY Right 04/19/2022   Procedure: RIGHT TOTAL HIP ARTHROPLASTY ANTERIOR APPROACH;  Surgeon: Erik Nakayama, MD;  Location: [facility];  Service: Orthopedics;  Laterality: Right;   TOTAL KNEE ARTHROPLASTY Left 09/20/2022   Procedure: LEFT TOTAL KNEE ARTHROPLASTY;  Surgeon: Erik Nakayama, MD;  Location: [facility];  Service: Orthopedics;  Laterality: Left;   UMBILICAL HERNIA REPAIR     Patient Active Problem List   Diagnosis Date Noted   COVID-19 08/09/2022   Back pain 06/23/2022   Urinary retention 06/23/2022   Allergic sinusitis 04/12/2022   Rotator cuff tendinitis, right 02/03/2022   Primary osteoarthritis of right hip 06/24/2021   Primary  osteoarthritis of left knee 03/29/2021   Hamstring strain, left, initial encounter 02/26/2021   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 01/29/2021   OA (osteoarthritis) of knee 08/16/2020   Neoplasm of uncertain behavior of skin 03/12/2020   Tubular adenoma of colon 10/17/2018   Hyperlipidemia 10/12/2018   Greater trochanteric bursitis of both hips 10/12/2018   Morbid obesity ([place]) 10/12/2018   Vitamin D deficiency 08/01/2017   Neurotic excoriations 07/27/2016   Family history of colon cancer requiring screening colonoscopy 05/16/2012   Prediabetes 05/16/2012   Tobacco abuse 07/20/2011   Hypogonadism male 07/20/2011   Essential hypertension, benign 06/29/2011    PCP: Dr Erik Estrada  REFERRING PROVIDER: Dr Erik Estrada  REFERRING DIAG: Lt TKA  THERAPY DIAG:  Chronic pain of left knee  Abnormal gait  Muscle weakness (generalized)  Localized edema  Other symptoms and signs involving the musculoskeletal system  Stiffness of left knee, not elsewhere classified  Rationale for Evaluation and Treatment Rehabilitation  ONSET DATE: 09/20/22  SUBJECTIVE:   SUBJECTIVE STATEMENT: Still thinks the heel lift on the Rt helps with his balance. He has been exercising in the water some in the past week.    PERTINENT HISTORY: Lt TKA 09/20/22 Rt THA 5/23; HTN; arthritis  PAIN:  Are you having pain? Yes:  NPRS scale: 1-2/10 Pain location: Lt knee  Pain description: sharp; aching; tight; throbbing  Aggravating factors: movement Relieving factors: meds and elevation   PRECAUTIONS: post L TKA 09/20/22  WEIGHT BEARING RESTRICTIONS No  FALLS:  Has patient fallen in last 6 months? No  PATIENT GOALS use the [name] again and get around without walker or cane   OBJECTIVE:   LOWER EXTREMITY ROM:  Active Assistive ROM Right eval Left eval Left 10/23 Left 10/30 Left 10/19/22 Left 11/02/22  Hip flexion 120 110      Hip extension 5 0       Hip abduction 26 32      Hip adduction        Hip internal rotation        Hip external rotation        Knee flexion 110 63 90 in sitting  100 sup 105 supine  114 supine   Knee extension 0 -14 -25 extensor lag -10 prone -9 Supine heel propped on bolster -11 Supine heel propped on bolster   Ankle dorsiflexion        Ankle plantarflexion        Ankle inversion        Ankle eversion         (Blank rows = not tested)   TODAY'S TREATMENT: [facility] Adult PT Treatment:                   ________       Date: 11/02/22 Therapeutic exercise:  Continues to use 5/8 in heel lift Rt shoe to improve leg length symmetry in standing   Nustep L7 UE 9 seat 7 x 10 min   Sitting:   Sit to stand x 10 x 2 sets w/out UE assist  Supine SAQ 2x10 last 10 w/ hold at end range Quad set with bolster under ankle 1x10x3 sec overpressure by PT x 5 reps x 5 3 sec hold   Prone Quad set x 10 x 2 sets for knee extension Hip extension 10 x 2 work on maintaining knee ext  Standing Standing Lt foot on 12 inch step pushing back to work on knee extension x 5 Mini knee bends x 10 x 2 sets UE assist as needed for balance  Heel raises 10 x 2 sets  Standing w/knee ext working on stepping fwd with Rt LE    Manual therapy: Knee ext PROM Deep tissue work and trigger point work through [employer] and calf, massage stick - pt prone Grade II and III tibiofemoral mobs for knee flex/ext Trigger Point Dry-Needling  Treatment instructions: Expect mild to moderate muscle soreness. S/S of pneumothorax if dry needled over a lung field, and to seek immediate medical attention should they occur. Patient verbalized understanding of these instructions and education.  Patient Consent Given: Yes Education handout provided: Yes Muscles treated: Lt hamstrings; gastroc/soleus Electrical stimulation performed: Yes Parameters: mAmp current x 5 min to pt tolerance  Treatment response/outcome: decreased palpable tightness    Vaso:  x10 min medium pressure, 34 deg  Date : 10/31/22 Therapeutic exercise:  Continues to use 5/8 in heel lift Rt shoe to improve leg length symmetry in standing   Nustep L7 UE 9 seat 7 x 12 min   Sitting:   Sit to stand x 10 x 2 sets w/out UE assist  Supine Heel slides with strap x10 Quad set with bolster under ankle 1x10x3 sec overpressure by PT x 5 reps x 5 3 sec hold SAQ 2x10 last 10 w/PT overpressure  hold at end range Bridging double legs x 10 x 5 sec hold  Half bridging Lt LE  x 10 x 5 sec x 2 sets  Prone Quad set x 10 x 2 sets for knee extension Hip extension 10 x 2 work on maintaining knee ext  Standing Step up 6 in step Lt LE x 10 x 2 sets  Standing Lt foot on 12 inch step pushing back to work on knee extension x 5 Standing Lt foot on 12 inch pushing fwd to work on knee flexion x 5  Mini knee bends x 10 x 2 sets UE assist as needed for balance  Heel raises 10 x 2 sets  Working on Lt knee extension with PT assist 10 x 5-10 sec with PT assisting pt into knee extension Standing w/knee ext working on stepping fwd with Rt LE    Manual therapy: Knee ext PROM Deep tissue work and trigger point work through [employer] and calf - pt prone Grade II and III tibiofemoral mobs for knee flex/ext  Vaso: x10 min medium pressure, 34 deg  10/26/22 Therapeutic exercise:  Added 5/8 in heel lift Rt shoe to improve leg length symmetry in standing   Nustep L6 x 15 min   Sitting    Sit to stand x 10 x 2 sets w/out UE assist  Supine Heel slides with strap x10 Quad set with bolster under ankle 1x10x3 sec overpressure by PT x 5 reps x 5 3 sec hold SAQ 2x10 last 10 w/PT overpressure hold at end range Prone PROM flexion w/strap 4-5 sec hold x 10  Quad set x 10 x 2 sets for knee extension Hip extension 10 x 2 work on maintaining knee ext  Standing Terminal knee ext pressing ball 10 sec 2x10  Working on Lt knee extension with PT assist 10 x 5-10 sec with PT assisting pt into knee  extension Standing w/ knee ext working on stepping fwd with Rt LE    Manual therapy: Knee ext PROM Myofacial release with massage stick Lt HS&calf  Grade II and III tibiofemoral mobs for knee flex/ext  Vaso: x10 min medium pressure, 34 deg   PATIENT EDUCATION:  Education details: rationale for interventions, POC Person educated: Patient Education method: Explanation, Demonstration, Tactile cues, Verbal cues, handouts Education comprehension: verbalized understanding, returned demonstration, verbal cues required, tactile cues required, and needs further education   HOME EXERCISE PROGRAM: Access Code: [email or web address] URL: [email or web address] Date: 09/22/2022 Prepared by: Gillermo Murdoch  Exercises - Ankle Pumps in Elevation  - 2 x daily - 7 x weekly - 1 sets - 10-20 reps - Ankle Circles in Elevation  - 2 x daily - 7 x weekly - 1 sets - 10-15 reps - Supine Quad Set  - 2 x daily - 7 x weekly - 1 sets - 10 reps - 3 sec  hold - Small Range Straight Leg Raise  - 2 x daily - 7 x weekly - 1 sets - 10 reps - 5 sec  hold - Supine Hip Abduction  - 2 x daily - 7 x weekly - 1 sets - 10 reps - 3 sec  hold - Supine Heel Slide with Strap  - 2 x daily - 7 x weekly - 1 sets - 5-10 reps - 10 sec  hold - Seated Heel Slide  - 2 x daily - 7 x weekly - 1 sets - 5-10 reps - 10 sec  hold  Dry needling handout   ASSESSMENT:  CLINICAL IMPRESSION: 11/02/22: Continued tightness especially in Lt knee extension. Trial of DN to hamstrings and calf with some improved tissue extensibility. Noted gains in knee flexion but no significant change in knee extension. He has continued pain - variable intensity. Erik Estrada is gradually progressing with Lt knee rehab. He has had slight Lt knee flexion with functional activities including standing and walking for at least 2 years prior to knee replacement which makes regaining full knee extension difficult. Working on Lt knee ROM, mobility, strength. Knee extension continues  to be most limited compared to flexion, with extensor lag. Erik Estrada will continue water exercises each week independently.   OBJECTIVE IMPAIRMENTS Abnormal gait, decreased activity tolerance, decreased balance, decreased mobility, difficulty walking, decreased ROM, decreased strength, increased edema, impaired flexibility, postural dysfunction, and pain.   REHAB POTENTIAL: Good   GOALS: Goals reviewed with patient? Yes  SHORT TERM GOALS: Target date: 10/20/2022  Independent in initial HEP  Baseline: Goal status: INITIAL  2.  Increase AAROM Lt knee to (-)8 to (-)10 ext and 90 to 95 deg flexion  Baseline:  Goal status: INITIAL  3.  Independent in gait with rolling walker improved gait pattern and increased wt bearing Lt LE  Baseline:  Goal status: INITIAL    LONG TERM GOALS: Target date: 11/17/2022   Increased AROM Lt knee to 0 deg extension and 100 degrees flexion Baseline:  Goal status: INITIAL  2.  Increase strength Lt hip and knee to 4+/5 to 5/5  Baseline:  Goal status: INITIAL  3.  Gait with least restrictive assistive device for community distance of at least 400-600 ft Baseline:  Goal status: INITIAL  4.  Independent in all transfers in home  Baseline:  Goal status: INITIAL  5.  Independent in ascending and descending 14 steps in home Baseline:  Goal status: INITIAL  6.  Independent in HEP  Baseline:  Goal status: INITIAL  7. Improve functional limitation score to 45  Goal status: INITIAL   PLAN: PT FREQUENCY: 2x/week  PT DURATION: 8 weeks  PLANNED INTERVENTIONS: Therapeutic exercises, Therapeutic activity, Neuromuscular re-education, Balance training, Gait training, Patient/Family education, Self Care, Joint mobilization, Stair training, DME instructions, Aquatic Therapy, Dry Needling, Electrical stimulation, Cryotherapy, Moist heat, Taping, Vasopneumatic device, Ultrasound, Ionotophoresis '4mg'$ /ml Dexamethasone, Manual therapy, and Re-evaluation  PLAN FOR  NEXT SESSION:  continue knee rehab - working on ROM and strengthening; focus on gaining ext/flex. Provided info on DN - consider trial of DN Lt hamstrings and calf  Greely Atiyeh Nilda Simmer, PT, MPH  11/02/2022 10:58 AM

## 2022-11-06 ENCOUNTER — Other Ambulatory Visit: Payer: Self-pay | Admitting: Family Medicine

## 2022-11-07 ENCOUNTER — Ambulatory Visit: Payer: Medicare Other | Admitting: Physical Therapy

## 2022-11-07 ENCOUNTER — Encounter: Payer: Self-pay | Admitting: Physical Therapy

## 2022-11-07 DIAGNOSIS — R600 Localized edema: Secondary | ICD-10-CM

## 2022-11-07 DIAGNOSIS — R29898 Other symptoms and signs involving the musculoskeletal system: Secondary | ICD-10-CM | POA: Diagnosis not present

## 2022-11-07 DIAGNOSIS — M25562 Pain in left knee: Secondary | ICD-10-CM | POA: Diagnosis not present

## 2022-11-07 DIAGNOSIS — M6281 Muscle weakness (generalized): Secondary | ICD-10-CM

## 2022-11-07 DIAGNOSIS — R269 Unspecified abnormalities of gait and mobility: Secondary | ICD-10-CM

## 2022-11-07 DIAGNOSIS — M25662 Stiffness of left knee, not elsewhere classified: Secondary | ICD-10-CM

## 2022-11-07 DIAGNOSIS — G8929 Other chronic pain: Secondary | ICD-10-CM | POA: Diagnosis not present

## 2022-11-07 NOTE — Therapy (Signed)
OUTPATIENT PHYSICAL THERAPY LOWER EXTREMITY TREATMENT      Patient Name: Irvin Bastin MRN: [id number] DOB:Oct 27, 1951, 71 y.o., male Today's Date: 11/07/2022   PT End of Session - 11/07/22 1534     Visit Number 14    Date for PT Re-Evaluation 11/17/22    Authorization Type Medicare    Progress Note Due on Visit 94    PT Start Time 1530    PT Stop Time 1615    PT Time Calculation (min) 45 min    Activity Tolerance Patient tolerated treatment well    Behavior During Therapy [facility] for tasks assessed/performed                Past Medical History:  Diagnosis Date   Anxiety    Arthritis    Essential hypertension, benign 06/29/2011   Hemorrhoids 08/01/2018   History of kidney stones    Morbid obesity ([place]) 10/12/2018   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  Plan for 5-year recheck.   Vitamin D deficiency 08/01/2017   Past Surgical History:  Procedure Laterality Date   KIDNEY STONE SURGERY     SHOULDER SURGERY  December 2010   Left   TOTAL HIP ARTHROPLASTY Right 04/19/2022   Procedure: RIGHT TOTAL HIP ARTHROPLASTY ANTERIOR APPROACH;  Surgeon: Melrose Nakayama, MD;  Location: [facility];  Service: Orthopedics;  Laterality: Right;   TOTAL KNEE ARTHROPLASTY Left 09/20/2022   Procedure: LEFT TOTAL KNEE ARTHROPLASTY;  Surgeon: Melrose Nakayama, MD;  Location: [facility];  Service: Orthopedics;  Laterality: Left;   UMBILICAL HERNIA REPAIR     Patient Active Problem List   Diagnosis Date Noted   COVID-19 08/09/2022   Back pain 06/23/2022   Urinary retention 06/23/2022   Allergic sinusitis 04/12/2022   Rotator cuff tendinitis, right 02/03/2022   Primary osteoarthritis of right hip 06/24/2021   Primary osteoarthritis of left knee 03/29/2021   Hamstring strain, left, initial encounter 02/26/2021   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 01/29/2021   OA (osteoarthritis) of knee 08/16/2020   Neoplasm of uncertain behavior of skin 03/12/2020   Tubular adenoma of colon 10/17/2018   Hyperlipidemia 10/12/2018   Greater trochanteric bursitis of both hips 10/12/2018   Morbid obesity ([place]) 10/12/2018   Vitamin D deficiency 08/01/2017   Neurotic excoriations 07/27/2016   Family history of colon cancer requiring screening colonoscopy 05/16/2012   Prediabetes 05/16/2012   Tobacco abuse 07/20/2011   Hypogonadism male 07/20/2011   Essential hypertension, benign 06/29/2011    PCP: Dr Luetta Nutting  REFERRING PROVIDER: Dr Melrose Nakayama  REFERRING DIAG: Lt TKA  THERAPY DIAG:  Chronic pain of left knee  Abnormal gait  Muscle weakness (generalized)  Localized edema  Other symptoms and signs involving the musculoskeletal system  Stiffness of left knee, not elsewhere classified  Rationale for Evaluation and Treatment Rehabilitation  ONSET DATE: 09/20/22  SUBJECTIVE:   SUBJECTIVE STATEMENT: Pt states he wasn't too sore after TPDN. Was not sure if it helped or not.   PERTINENT HISTORY: Lt TKA 09/20/22 Rt THA 5/23; HTN; arthritis  PAIN:  Are you having pain? Yes:  NPRS scale: 1-2/10 Pain location: Lt knee  Pain description: sharp; aching; tight; throbbing  Aggravating factors: movement Relieving factors: meds and elevation   PRECAUTIONS: post L TKA 09/20/22  WEIGHT BEARING RESTRICTIONS No  FALLS:  Has patient fallen in  last 6 months? No  PATIENT GOALS use the [name] again and get around without walker or cane   OBJECTIVE:   LOWER EXTREMITY ROM:  Active Assistive ROM Right eval Left eval Left 10/23 Left 10/30 Left 10/19/22 Left 11/02/22  Hip flexion 120 110      Hip extension 5 0      Hip abduction 26 32      Hip adduction        Hip internal rotation        Hip external rotation        Knee flexion 110 63 90 in sitting  100 sup 105 supine  114 supine   Knee extension  0 -14 -25 extensor lag -10 prone -9 Supine heel propped on bolster -11 Supine heel propped on bolster   Ankle dorsiflexion        Ankle plantarflexion        Ankle inversion        Ankle eversion         (Blank rows = not tested)   TODAY'S TREATMENT: [facility] Adult PT Treatment:                   ________       Date: 11/07/22   Therapeutic exercise:   Nustep L7 UEs/LEs x 6 min     Supine   Hamstring stretch with strap 2x30 sec   Quad set x10     Prone   Knee hang x2 min   Knee ext stretch with contract/relax 5x5 sec     Standing   Runner's step up 4" step x10   Gastroc stretch on slant board 2x30 sec   Runner's stretch 2x30 sec   Knee flex/ext on 8" step x10 R&L    Modalities:   [place] x 8 min hamstrings, 100% duty cycle, 1 W/Cm^2, thermal, 1 MHz  Manual therapy: Knee ext PROM Grade II to III mob for knee ext  Gait:  Forwards amb x3 laps around gym focusing on weight shifting to L, heel strike, knee ext  Backwards amb x 1 lap around gym focusing on knee ext   Date: 11/02/22 Therapeutic exercise:  Continues to use 5/8 in heel lift Rt shoe to improve leg length symmetry in standing   Nustep L7 UE 9 seat 7 x 10 min   Sitting:   Sit to stand x 10 x 2 sets w/out UE assist  Supine SAQ 2x10 last 10 w/ hold at end range Quad set with bolster under ankle 1x10x3 sec overpressure by PT x 5 reps x 5 3 sec hold   Prone Quad set x 10 x 2 sets for knee extension Hip extension 10 x 2 work on maintaining knee ext  Standing Standing Lt foot on 12 inch step pushing back to work on knee extension x 5 Mini knee bends x 10 x 2 sets UE assist as needed for balance  Heel raises 10 x 2 sets  Standing w/knee ext working on stepping fwd with Rt LE    Manual therapy: Knee ext PROM Deep tissue work and trigger point work through [employer] and calf, massage stick - pt prone Grade II and III tibiofemoral mobs for knee flex/ext Trigger Point Dry-Needling  Treatment instructions:  Expect mild to moderate muscle soreness. S/S of pneumothorax if dry needled over a lung field, and to seek immediate medical attention should they occur. Patient verbalized understanding of these instructions and education.  Patient Consent Given:  Yes Education handout provided: Yes Muscles treated: Lt hamstrings; gastroc/soleus Electrical stimulation performed: Yes Parameters: mAmp current x 5 min to pt tolerance  Treatment response/outcome: decreased palpable tightness    Vaso: x10 min medium pressure, 34 deg  Date : 10/31/22 Therapeutic exercise:  Continues to use 5/8 in heel lift Rt shoe to improve leg length symmetry in standing   Nustep L7 UE 9 seat 7 x 12 min   Sitting:   Sit to stand x 10 x 2 sets w/out UE assist  Supine Heel slides with strap x10 Quad set with bolster under ankle 1x10x3 sec overpressure by PT x 5 reps x 5 3 sec hold SAQ 2x10 last 10 w/PT overpressure hold at end range Bridging double legs x 10 x 5 sec hold  Half bridging Lt LE  x 10 x 5 sec x 2 sets  Prone Quad set x 10 x 2 sets for knee extension Hip extension 10 x 2 work on maintaining knee ext  Standing Step up 6 in step Lt LE x 10 x 2 sets  Standing Lt foot on 12 inch step pushing back to work on knee extension x 5 Standing Lt foot on 12 inch pushing fwd to work on knee flexion x 5  Mini knee bends x 10 x 2 sets UE assist as needed for balance  Heel raises 10 x 2 sets  Working on Lt knee extension with PT assist 10 x 5-10 sec with PT assisting pt into knee extension Standing w/knee ext working on stepping fwd with Rt LE    Manual therapy: Knee ext PROM Deep tissue work and trigger point work through [employer] and calf - pt prone Grade II and III tibiofemoral mobs for knee flex/ext  Vaso: x10 min medium pressure, 34 deg  10/26/22 Therapeutic exercise:  Added 5/8 in heel lift Rt shoe to improve leg length symmetry in standing   Nustep L6 x 15 min   Sitting    Sit to stand x 10 x 2  sets w/out UE assist  Supine Heel slides with strap x10 Quad set with bolster under ankle 1x10x3 sec overpressure by PT x 5 reps x 5 3 sec hold SAQ 2x10 last 10 w/PT overpressure hold at end range Prone PROM flexion w/strap 4-5 sec hold x 10  Quad set x 10 x 2 sets for knee extension Hip extension 10 x 2 work on maintaining knee ext  Standing Terminal knee ext pressing ball 10 sec 2x10  Working on Lt knee extension with PT assist 10 x 5-10 sec with PT assisting pt into knee extension Standing w/ knee ext working on stepping fwd with Rt LE    Manual therapy: Knee ext PROM Myofacial release with massage stick Lt HS&calf  Grade II and III tibiofemoral mobs for knee flex/ext  Vaso: x10 min medium pressure, 34 deg   PATIENT EDUCATION:  Education details: rationale for interventions, POC Person educated: Patient Education method: Explanation, Demonstration, Tactile cues, Verbal cues, handouts Education comprehension: verbalized understanding, returned demonstration, verbal cues required, tactile cues required, and needs further education   HOME EXERCISE PROGRAM: Access Code: [email or web address] URL: [email or web address] Date: 09/22/2022 Prepared by: Gillermo Murdoch  Exercises - Ankle Pumps in Elevation  - 2 x daily - 7 x weekly - 1 sets - 10-20 reps - Ankle Circles in Elevation  - 2 x daily - 7 x weekly - 1 sets - 10-15 reps - Supine Quad Set  - 2 x daily - 7  x weekly - 1 sets - 10 reps - 3 sec  hold - Small Range Straight Leg Raise  - 2 x daily - 7 x weekly - 1 sets - 10 reps - 5 sec  hold - Supine Hip Abduction  - 2 x daily - 7 x weekly - 1 sets - 10 reps - 3 sec  hold - Supine Heel Slide with Strap  - 2 x daily - 7 x weekly - 1 sets - 5-10 reps - 10 sec  hold - Seated Heel Slide  - 2 x daily - 7 x weekly - 1 sets - 5-10 reps - 10 sec  hold  Dry needling handout   ASSESSMENT:  CLINICAL IMPRESSION: Performed trial of [place] along pt's hamstring tendon to decrease tissue  shortening. Continued to work on knee ext and flexion. Increased time focusing on improving gastroc lengthening with hopes of further improving knee ext.   OBJECTIVE IMPAIRMENTS Abnormal gait, decreased activity tolerance, decreased balance, decreased mobility, difficulty walking, decreased ROM, decreased strength, increased edema, impaired flexibility, postural dysfunction, and pain.   REHAB POTENTIAL: Good   GOALS: Goals reviewed with patient? Yes  SHORT TERM GOALS: Target date: 10/20/2022  Independent in initial HEP  Baseline: Goal status: INITIAL  2.  Increase AAROM Lt knee to (-)8 to (-)10 ext and 90 to 95 deg flexion  Baseline:  Goal status: INITIAL  3.  Independent in gait with rolling walker improved gait pattern and increased wt bearing Lt LE  Baseline:  Goal status: INITIAL    LONG TERM GOALS: Target date: 11/17/2022   Increased AROM Lt knee to 0 deg extension and 100 degrees flexion Baseline:  Goal status: INITIAL  2.  Increase strength Lt hip and knee to 4+/5 to 5/5  Baseline:  Goal status: INITIAL  3.  Gait with least restrictive assistive device for community distance of at least 400-600 ft Baseline:  Goal status: INITIAL  4.  Independent in all transfers in home  Baseline:  Goal status: INITIAL  5.  Independent in ascending and descending 14 steps in home Baseline:  Goal status: INITIAL  6.  Independent in HEP  Baseline:  Goal status: INITIAL  7. Improve functional limitation score to 45  Goal status: INITIAL   PLAN: PT FREQUENCY: 2x/week  PT DURATION: 8 weeks  PLANNED INTERVENTIONS: Therapeutic exercises, Therapeutic activity, Neuromuscular re-education, Balance training, Gait training, Patient/Family education, Self Care, Joint mobilization, Stair training, DME instructions, Aquatic Therapy, Dry Needling, Electrical stimulation, Cryotherapy, Moist heat, Taping, Vasopneumatic device, Ultrasound, Ionotophoresis '4mg'$ /ml Dexamethasone, Manual  therapy, and Re-evaluation  PLAN FOR NEXT SESSION:  continue knee rehab - working on ROM and strengthening; focus on gaining ext/flex. Provided info on DN - consider trial of DN Lt hamstrings and calf  Terica Yogi April Ma L Dechelle Attaway, PT 11/07/2022 3:35 PM

## 2022-11-10 ENCOUNTER — Encounter: Payer: Self-pay | Admitting: Rehabilitative and Restorative Service Providers"

## 2022-11-10 ENCOUNTER — Ambulatory Visit: Payer: Medicare Other | Admitting: Rehabilitative and Restorative Service Providers"

## 2022-11-10 DIAGNOSIS — R29898 Other symptoms and signs involving the musculoskeletal system: Secondary | ICD-10-CM | POA: Diagnosis not present

## 2022-11-10 DIAGNOSIS — M25562 Pain in left knee: Secondary | ICD-10-CM | POA: Diagnosis not present

## 2022-11-10 DIAGNOSIS — M25662 Stiffness of left knee, not elsewhere classified: Secondary | ICD-10-CM

## 2022-11-10 DIAGNOSIS — R600 Localized edema: Secondary | ICD-10-CM | POA: Diagnosis not present

## 2022-11-10 DIAGNOSIS — R269 Unspecified abnormalities of gait and mobility: Secondary | ICD-10-CM | POA: Diagnosis not present

## 2022-11-10 DIAGNOSIS — M6281 Muscle weakness (generalized): Secondary | ICD-10-CM | POA: Diagnosis not present

## 2022-11-10 DIAGNOSIS — G8929 Other chronic pain: Secondary | ICD-10-CM | POA: Diagnosis not present

## 2022-11-10 NOTE — Therapy (Signed)
OUTPATIENT PHYSICAL THERAPY LOWER EXTREMITY TREATMENT      Patient Name: Erik Estrada MRN: [id number] DOB:03-Mar-1951, 71 y.o., male Today's Date: 11/10/2022   PT End of Session - 11/10/22 1453     Visit Number 15    Number of Visits 16    Date for PT Re-Evaluation 11/17/22    Authorization Type Medicare    Authorization - Visit Number 16    Progress Note Due on Visit 59    PT Start Time 1448    PT Stop Time 1536    PT Time Calculation (min) 48 min                Past Medical History:  Diagnosis Date   Anxiety    Arthritis    Essential hypertension, benign 06/29/2011   Hemorrhoids 08/01/2018   History of kidney stones    Morbid obesity ([place]) 10/12/2018   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  Plan for 5-year recheck.   Vitamin D deficiency 08/01/2017   Past Surgical History:  Procedure Laterality Date   KIDNEY STONE SURGERY     SHOULDER SURGERY  December 2010   Left   TOTAL HIP ARTHROPLASTY Right 04/19/2022   Procedure: RIGHT TOTAL HIP ARTHROPLASTY ANTERIOR APPROACH;  Surgeon: Melrose Nakayama, MD;  Location: [facility];  Service: Orthopedics;  Laterality: Right;   TOTAL KNEE ARTHROPLASTY Left 09/20/2022   Procedure: LEFT TOTAL KNEE ARTHROPLASTY;  Surgeon: Melrose Nakayama, MD;  Location: [facility];  Service: Orthopedics;  Laterality: Left;   UMBILICAL HERNIA REPAIR     Patient Active Problem List   Diagnosis Date Noted   COVID-19 08/09/2022   Back pain 06/23/2022   Urinary retention 06/23/2022   Allergic sinusitis 04/12/2022   Rotator cuff tendinitis, right 02/03/2022   Primary osteoarthritis of right hip 06/24/2021   Primary osteoarthritis of left knee 03/29/2021   Hamstring strain, left, initial encounter 02/26/2021   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 01/29/2021   OA (osteoarthritis) of  knee 08/16/2020   Neoplasm of uncertain behavior of skin 03/12/2020   Tubular adenoma of colon 10/17/2018   Hyperlipidemia 10/12/2018   Greater trochanteric bursitis of both hips 10/12/2018   Morbid obesity ([place]) 10/12/2018   Vitamin D deficiency 08/01/2017   Neurotic excoriations 07/27/2016   Family history of colon cancer requiring screening colonoscopy 05/16/2012   Prediabetes 05/16/2012   Tobacco abuse 07/20/2011   Hypogonadism male 07/20/2011   Essential hypertension, benign 06/29/2011    PCP: Dr Luetta Nutting  REFERRING PROVIDER: Dr Melrose Nakayama  REFERRING DIAG: Lt TKA  THERAPY DIAG:  Chronic pain of left knee  Abnormal gait  Muscle weakness (generalized)  Localized edema  Other symptoms and signs involving the musculoskeletal system  Stiffness of left knee, not elsewhere classified  Rationale for Evaluation and Treatment Rehabilitation  ONSET DATE: 09/20/22  SUBJECTIVE:   SUBJECTIVE STATEMENT: He has been working on walking without cane at home.    PERTINENT HISTORY: Lt TKA 09/20/22 Rt THA 5/23; HTN; arthritis  PAIN:  Are you having pain? Yes:  NPRS scale: 2/10 Pain location: Lt knee  Pain description: sharp; aching; tight; throbbing  Aggravating factors: walking; movement Relieving factors: meds and elevation   PRECAUTIONS: post L TKA 09/20/22   PATIENT GOALS use the [name] again and get around   without walker or cane  OBJECTIVE:   LOWER EXTREMITY ROM:  Active Assistive ROM Right eval Left eval Left 10/23 Left 10/30 Left 10/19/22 Left 11/02/22  Hip flexion 120 110      Hip extension 5 0      Hip abduction 26 32      Hip adduction        Hip internal rotation        Hip external rotation        Knee flexion 110 63 90 in sitting  100 sup 105 supine  114 supine   Knee extension 0 -14 -25 extensor lag -10 prone -9 Supine heel propped on bolster -11 Supine heel propped on bolster   Ankle dorsiflexion        Ankle plantarflexion         Ankle inversion        Ankle eversion         (Blank rows = not tested)   TODAY'S TREATMENT: [facility] Adult PT Treatment:                   ________       Date: 11/10/22 Therapeutic exercise:   Nustep L7 UEs/LEs x 6 min     Supine   Hamstring stretch with strap 2x30 sec   Quad set x10     Prone   Glut set 10 sec x 10    Knee extension to hip ext 5 sec x 10 x 2 sets      Standing   Hip extension/knee extension with assist of stretch out strap PT pulling pt into knee extension (wife instructed in trying this for home)     Modalities:   [place] x 10 min distal hamstrings/proximal calf, 100% duty cycle, 1 W/Cm^2, thermal, 1 MHz  Date: 11/07/22   Therapeutic exercise:   Nustep L7 UEs/LEs x 6 min     Supine   Hamstring stretch with strap 2x30 sec   Quad set x10     Prone   Knee hang x2 min   Knee ext stretch with contract/relax 5x5 sec     Standing   Runner's step up 4" step x10   Gastroc stretch on slant board 2x30 sec   Runner's stretch 2x30 sec   Knee flex/ext on 8" step x10 R&L    Modalities:   [place] x 8 min hamstrings, 100% duty cycle, 1 W/Cm^2, thermal, 1 MHz  Manual therapy: Knee ext PROM Grade II to III mob for knee ext  Gait:  Forwards amb x3 laps around gym focusing on weight shifting to L, heel strike, knee ext  Backwards amb x 1 lap around gym focusing on knee ext   Date: 11/02/22 Therapeutic exercise:  Continues to use 5/8 in heel lift Rt shoe to improve leg length symmetry in standing   Nustep L7 UE 9 seat 7 x 10 min   Sitting:   Sit to stand x 10 x 2 sets w/out UE assist  Supine SAQ 2x10 last 10 w/ hold at end range Quad set with bolster under ankle 1x10x3 sec overpressure by PT x 5 reps x 5 3 sec hold   Prone Quad set x 10 x 2 sets for knee extension Hip extension 10 x 2 work on maintaining knee ext  Standing Standing Lt foot on 12 inch step pushing back to work on knee extension x 5 Mini knee bends x 10 x 2 sets UE assist as needed for  balance  Heel raises 10 x  2 sets  Standing w/knee ext working on stepping fwd with Rt LE    Manual therapy: Knee ext PROM Deep tissue work and trigger point work through [employer] and calf, massage stick - pt prone Grade II and III tibiofemoral mobs for knee flex/ext Trigger Point Dry-Needling  Treatment instructions: Expect mild to moderate muscle soreness. S/S of pneumothorax if dry needled over a lung field, and to seek immediate medical attention should they occur. Patient verbalized understanding of these instructions and education.  Patient Consent Given: Yes Education handout provided: Yes Muscles treated: Lt hamstrings; gastroc/soleus Electrical stimulation performed: Yes Parameters: mAmp current x 5 min to pt tolerance  Treatment response/outcome: decreased palpable tightness    Vaso: x10 min medium pressure, 34 deg  PATIENT EDUCATION:  Education details: rationale for interventions, POC Person educated: Patient Education method: Explanation, Demonstration, Tactile cues, Verbal cues, handouts Education comprehension: verbalized understanding, returned demonstration, verbal cues required, tactile cues required, and needs further education   HOME EXERCISE PROGRAM: Access Code: [email or web address] URL: [email or web address] Date: 09/22/2022 Prepared by: Gillermo Murdoch  Exercises - Ankle Pumps in Elevation  - 2 x daily - 7 x weekly - 1 sets - 10-20 reps - Ankle Circles in Elevation  - 2 x daily - 7 x weekly - 1 sets - 10-15 reps - Supine Quad Set  - 2 x daily - 7 x weekly - 1 sets - 10 reps - 3 sec  hold - Small Range Straight Leg Raise  - 2 x daily - 7 x weekly - 1 sets - 10 reps - 5 sec  hold - Supine Hip Abduction  - 2 x daily - 7 x weekly - 1 sets - 10 reps - 3 sec  hold - Supine Heel Slide with Strap  - 2 x daily - 7 x weekly - 1 sets - 5-10 reps - 10 sec  hold - Seated Heel Slide  - 2 x daily - 7 x weekly - 1 sets - 5-10 reps - 10 sec  hold  Dry needling  handout   ASSESSMENT:  CLINICAL IMPRESSION: Continued trial of [place] along pt's hamstring tendon and proximal gastroc to increase tissue extensibility. Continued to work on knee ext and flexion. Increased focusing on improving gastroc lengthening to improving knee ext.   OBJECTIVE IMPAIRMENTS Abnormal gait, decreased activity tolerance, decreased balance, decreased mobility, difficulty walking, decreased ROM, decreased strength, increased edema, impaired flexibility, postural dysfunction, and pain.   GOALS: Goals reviewed with patient? Yes  SHORT TERM GOALS: Target date: 10/20/2022  Independent in initial HEP  Baseline: Goal status: accomplished  2.  Increase AAROM Lt knee to (-)8 to (-)10 ext and 90 to 95 deg flexion  Baseline:  Goal status: partially accomplished  3.  Independent in gait with rolling walker improved gait pattern and increased wt bearing Lt LE  Baseline:  Goal status: accomplished    LONG TERM GOALS: Target date: 11/17/2022   Increased AROM Lt knee to 0 deg extension and 100 degrees flexion Baseline:  Goal status: on going  2.  Increase strength Lt hip and knee to 4+/5 to 5/5  Baseline:  Goal status: accomplished  3.  Gait with least restrictive assistive device for community distance of at least 400-600 ft Baseline:  Goal status: accomplished  4.  Independent in all transfers in home  Baseline:  Goal status: on going   5.  Independent in ascending and descending 14 steps in home Baseline:  Goal status: accomplished  6.  Independent in HEP  Baseline:  Goal status: on going   7. Improve functional limitation score to 45 11/10/22: 45  Goal status: accomplished    PLAN: PT FREQUENCY: 2x/week  PT DURATION: 8 weeks  PLANNED INTERVENTIONS: Therapeutic exercises, Therapeutic activity, Neuromuscular re-education, Balance training, Gait training, Patient/Family education, Self Care, Joint mobilization, Stair training, DME instructions, Aquatic  Therapy, Dry Needling, Electrical stimulation, Cryotherapy, Moist heat, Taping, Vasopneumatic device, Ultrasound, Ionotophoresis '4mg'$ /ml Dexamethasone, Manual therapy, and Re-evaluation  PLAN FOR NEXT SESSION:  continue knee rehab - working on ROM and strengthening; focus on gaining ext/flex. Provided info on DN - consider trial of DN Lt hamstrings and calf  Mahlon Gabrielle Nilda Simmer, PT, MPH  11/10/2022 2:54 PM

## 2022-11-14 ENCOUNTER — Ambulatory Visit: Payer: Medicare Other | Attending: Family Medicine | Admitting: Rehabilitative and Restorative Service Providers"

## 2022-11-14 ENCOUNTER — Encounter: Payer: Self-pay | Admitting: Rehabilitative and Restorative Service Providers"

## 2022-11-14 DIAGNOSIS — M25562 Pain in left knee: Secondary | ICD-10-CM | POA: Insufficient documentation

## 2022-11-14 DIAGNOSIS — M25662 Stiffness of left knee, not elsewhere classified: Secondary | ICD-10-CM | POA: Diagnosis not present

## 2022-11-14 DIAGNOSIS — R29898 Other symptoms and signs involving the musculoskeletal system: Secondary | ICD-10-CM | POA: Diagnosis not present

## 2022-11-14 DIAGNOSIS — R600 Localized edema: Secondary | ICD-10-CM | POA: Insufficient documentation

## 2022-11-14 DIAGNOSIS — G8929 Other chronic pain: Secondary | ICD-10-CM | POA: Insufficient documentation

## 2022-11-14 DIAGNOSIS — R269 Unspecified abnormalities of gait and mobility: Secondary | ICD-10-CM | POA: Diagnosis not present

## 2022-11-14 DIAGNOSIS — R293 Abnormal posture: Secondary | ICD-10-CM | POA: Insufficient documentation

## 2022-11-14 DIAGNOSIS — M6281 Muscle weakness (generalized): Secondary | ICD-10-CM | POA: Diagnosis not present

## 2022-11-14 NOTE — Therapy (Signed)
OUTPATIENT PHYSICAL THERAPY LOWER EXTREMITY TREATMENT and [place] REVERIFICATION    Patient Name: Erik Estrada MRN: [id number] DOB:1951/08/16, 71 y.o., male Today's Date: 11/14/2022   PT End of Session - 11/14/22 1151     Visit Number 16    Number of Visits 28    Date for PT Re-Evaluation 12/26/22    Authorization Type Medicare    Authorization - Visit Number 16    Progress Note Due on Visit 66    PT Start Time 4970    PT Stop Time 2637    PT Time Calculation (min) 50 min    Activity Tolerance Patient tolerated treatment well                Past Medical History:  Diagnosis Date   Anxiety    Arthritis    Essential hypertension, benign 06/29/2011   Hemorrhoids 08/01/2018   History of kidney stones    Morbid obesity ([place]) 10/12/2018   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  Plan for 5-year recheck.   Vitamin D deficiency 08/01/2017   Past Surgical History:  Procedure Laterality Date   KIDNEY STONE SURGERY     SHOULDER SURGERY  December 2010   Left   TOTAL HIP ARTHROPLASTY Right 04/19/2022   Procedure: RIGHT TOTAL HIP ARTHROPLASTY ANTERIOR APPROACH;  Surgeon: Melrose Nakayama, MD;  Location: [facility];  Service: Orthopedics;  Laterality: Right;   TOTAL KNEE ARTHROPLASTY Left 09/20/2022   Procedure: LEFT TOTAL KNEE ARTHROPLASTY;  Surgeon: Melrose Nakayama, MD;  Location: [facility];  Service: Orthopedics;  Laterality: Left;   UMBILICAL HERNIA REPAIR     Patient Active Problem List   Diagnosis Date Noted   COVID-19 08/09/2022   Back pain 06/23/2022   Urinary retention 06/23/2022   Allergic sinusitis 04/12/2022   Rotator cuff tendinitis, right 02/03/2022   Primary osteoarthritis of right hip 06/24/2021   Primary osteoarthritis of left knee 03/29/2021   Hamstring strain, left, initial encounter 02/26/2021   Degenerative tear of  posterior horn of lateral meniscus of left knee 01/29/2021   OA (osteoarthritis) of knee 08/16/2020   Neoplasm of uncertain behavior of skin 03/12/2020   Tubular adenoma of colon 10/17/2018   Hyperlipidemia 10/12/2018   Greater trochanteric bursitis of both hips 10/12/2018   Morbid obesity ([place]) 10/12/2018   Vitamin D deficiency 08/01/2017   Neurotic excoriations 07/27/2016   Family history of colon cancer requiring screening colonoscopy 05/16/2012   Prediabetes 05/16/2012   Tobacco abuse 07/20/2011   Hypogonadism male 07/20/2011   Essential hypertension, benign 06/29/2011    PCP: Dr Luetta Nutting  REFERRING PROVIDER: Dr Melrose Nakayama  REFERRING DIAG: Lt TKA  THERAPY DIAG:  Chronic pain of left knee  Abnormal gait  Muscle weakness (generalized)  Localized edema  Other symptoms and signs involving the musculoskeletal system  Stiffness of left knee, not elsewhere classified  Abnormal posture  Rationale for Evaluation and Treatment Rehabilitation  ONSET DATE: 09/20/22  SUBJECTIVE:   SUBJECTIVE STATEMENT: He has been working on walking without cane at home except when going up and down steps. Had some increased swelling yesterday.     PERTINENT HISTORY: Lt TKA 09/20/22 Rt THA 5/23; HTN; arthritis  PAIN:  Are you having pain? Yes:  NPRS scale: 1-2/10 Pain location: Lt knee  Pain description: sharp; aching; tight; throbbing  Aggravating factors: walking; movement Relieving factors: meds and elevation  PRECAUTIONS: post L TKA 09/20/22   PATIENT GOALS use the [name] again and get around   without walker or cane   OBJECTIVE:    LOWER EXTREMITY STRENGTH:   5/5 hip and knee   LOWER EXTREMITY ROM:  Active Assistive ROM Right eval Left eval Left 10/23 Left 10/30 Left 10/19/22 Left 11/02/22 Left  11/14/22  Hip flexion 120 110     123  Hip extension 5 0       Hip abduction 26 32       Hip adduction         Hip internal rotation         Hip external  rotation         Knee flexion 110 63 90 in sitting  100 sup 105 supine  114 supine  120 AA supine   Knee extension 0 -14 -25 extensor lag -10 prone -9 Supine heel propped on bolster -11 Supine heel propped on bolster  -9 Supine heel propped on bolster   Ankle dorsiflexion         Ankle plantarflexion         Ankle inversion         Ankle eversion          (Blank rows = not tested)   TODAY'S TREATMENT: [facility] Adult PT Treatment:                   ________       11/14/22:  Therapeutic exercise:   Nustep L7 UEs/LEs x 7 min     Supine   Hamstring stretch with strap 2x30 sec   Quad set x10            SAQ 5 sec hold x 10 x 2 sets      Prone   Glut set 10 sec x 10    Knee extension to hip ext 5 sec x 10 x 2 sets     Sitting    Sit to stand x 10 x 2      Standing   Working on hip extension/knee extension  Manual therapy: Knee ext PROM Grade II to III mob for knee ext  Gait:  Forwards amb x3 laps around gym focusing on weight shifting to L, heel strike, knee ext  Backwards amb x 1 lap around gym focusing on knee ext   Date: 11/10/22 Therapeutic exercise:   Nustep L7 UEs/LEs x 6 min     Supine   Hamstring stretch with strap 2x30 sec   Quad set x10     Prone   Glut set 10 sec x 10    Knee extension to hip ext 5 sec x 10 x 2 sets      Standing   Hip extension/knee extension with assist of stretch out strap PT pulling pt into knee extension (wife instructed in trying this for home)     Modalities:   [place] x 10 min distal hamstrings/proximal calf, 100% duty cycle, 1 W/Cm^2, thermal, 1 MHz  PATIENT EDUCATION:  Education details: rationale for interventions, POC Person educated: Patient Education method: Explanation, Demonstration, Tactile cues, Verbal cues, handouts Education comprehension: verbalized understanding, returned demonstration, verbal cues required, tactile cues required, and needs further education   HOME EXERCISE PROGRAM: Access Code: [email or web address] URL:  [email or web address] Date: 09/22/2022 Prepared by: Gillermo Murdoch  Exercises - Ankle Pumps in Elevation  - 2 x daily - 7 x weekly - 1 sets - 10-20 reps - Ankle Circles  in Elevation  - 2 x daily - 7 x weekly - 1 sets - 10-15 reps - Supine Quad Set  - 2 x daily - 7 x weekly - 1 sets - 10 reps - 3 sec  hold - Small Range Straight Leg Raise  - 2 x daily - 7 x weekly - 1 sets - 10 reps - 5 sec  hold - Supine Hip Abduction  - 2 x daily - 7 x weekly - 1 sets - 10 reps - 3 sec  hold - Supine Heel Slide with Strap  - 2 x daily - 7 x weekly - 1 sets - 5-10 reps - 10 sec  hold - Seated Heel Slide  - 2 x daily - 7 x weekly - 1 sets - 5-10 reps - 10 sec  hold  Dry needling handout   ASSESSMENT:  CLINICAL IMPRESSION: Continued improvement in Lt hip and knee ROM but has persistent tightness at end ranges, especially Lt knee extension. Note increased edema Lt knee and ankle today. Continued to work on [place] and knee ext and flexion. Increased focusing on improving gastroc and hamstring length to improving knee ext. Progressing gradually toward stated goals of therapy and return to independent ADL's. Will benefit from continued treatment to achieve goals.   OBJECTIVE IMPAIRMENTS Abnormal gait, decreased activity tolerance, decreased balance, decreased mobility, difficulty walking, decreased ROM, decreased strength, increased edema, impaired flexibility, postural dysfunction, and pain.   GOALS: Goals reviewed with patient? Yes  SHORT TERM GOALS: Target date: 10/20/2022  Independent in initial HEP  Baseline: Goal status: accomplished  2.  Increase AAROM Lt knee to (-)8 to (-)10 ext and 90 to 95 deg flexion  Baseline:  Goal status: partially accomplished  3.  Independent in gait with rolling walker improved gait pattern and increased wt bearing Lt LE  Baseline:  Goal status: accomplished    LONG TERM GOALS: Target date: 1/ 15/2024   Increased AROM Lt knee to 0 deg  extension and 100 degrees flexion Baseline:  Goal status: on going  2.  Increase strength Lt hip and knee to 4+/5 to 5/5  Baseline:  Goal status: accomplished  3.  Gait with no assistive device and good gait pattern for community distance of at least 600-800 ft Baseline:  Goal status: revised  4.  Independent in all transfers in home  Baseline:  Goal status: on going   5.  Independent in ascending and descending 14 steps in home Baseline:  Goal status: accomplished  6.  Independent in HEP  Baseline:  Goal status: on going   7. Improve functional limitation score to 45 11/10/22: 45  Goal status: accomplished    PLAN: PT FREQUENCY: 2x/week  PT DURATION: 8 weeks  PLANNED INTERVENTIONS: Therapeutic exercises, Therapeutic activity, Neuromuscular re-education, Balance training, Gait training, Patient/Family education, Self Care, Joint mobilization, Stair training, DME instructions, Aquatic Therapy, Dry Needling, Electrical stimulation, Cryotherapy, Moist heat, Taping, Vasopneumatic device, Ultrasound, Ionotophoresis '4mg'$ /ml Dexamethasone, Manual therapy, and Re-evaluation  PLAN FOR NEXT SESSION:  continue knee rehab - working on ROM and strengthening; focus on gaining ext/flex. Consider continued trial of DN Lt hamstrings and calf  Genny Caulder Nilda Simmer, PT, MPH  11/14/2022 11:53 AM

## 2022-11-16 ENCOUNTER — Ambulatory Visit: Payer: Medicare Other | Admitting: Rehabilitative and Restorative Service Providers"

## 2022-11-16 ENCOUNTER — Encounter: Payer: Self-pay | Admitting: Rehabilitative and Restorative Service Providers"

## 2022-11-16 DIAGNOSIS — R269 Unspecified abnormalities of gait and mobility: Secondary | ICD-10-CM

## 2022-11-16 DIAGNOSIS — M25662 Stiffness of left knee, not elsewhere classified: Secondary | ICD-10-CM

## 2022-11-16 DIAGNOSIS — R600 Localized edema: Secondary | ICD-10-CM

## 2022-11-16 DIAGNOSIS — G8929 Other chronic pain: Secondary | ICD-10-CM | POA: Diagnosis not present

## 2022-11-16 DIAGNOSIS — M6281 Muscle weakness (generalized): Secondary | ICD-10-CM

## 2022-11-16 DIAGNOSIS — R29898 Other symptoms and signs involving the musculoskeletal system: Secondary | ICD-10-CM | POA: Diagnosis not present

## 2022-11-16 DIAGNOSIS — M25562 Pain in left knee: Secondary | ICD-10-CM | POA: Diagnosis not present

## 2022-11-16 NOTE — Therapy (Addendum)
OUTPATIENT PHYSICAL THERAPY LOWER EXTREMITY TREATMENT and [place] REVERIFICATION    Patient Name: Erik Estrada MRN: [id number] DOB:06-18-1951, 71 y.o., male Today's Date: 11/16/2022   PT End of Session - 11/16/22 1117     Visit Number [id number]    Number of Visits 28    Date for PT Re-Evaluation 12/26/22    Authorization Type Medicare    Authorization - Visit Number 17    Progress Note Due on Visit 20    PT Start Time 1055    PT Stop Time 1140    PT Time Calculation (min) 45 min    Activity Tolerance Patient tolerated treatment well                Past Medical History:  Diagnosis Date   Anxiety    Arthritis    Essential hypertension, benign 06/29/2011   Hemorrhoids 08/01/2018   History of kidney stones    Morbid obesity ([place]) 10/12/2018   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  Plan for 5-year recheck.   Vitamin D deficiency 08/01/2017   Past Surgical History:  Procedure Laterality Date   KIDNEY STONE SURGERY     SHOULDER SURGERY  December 2010   Left   TOTAL HIP ARTHROPLASTY Right 04/19/2022   Procedure: RIGHT TOTAL HIP ARTHROPLASTY ANTERIOR APPROACH;  Surgeon: Melrose Nakayama, MD;  Location: [facility];  Service: Orthopedics;  Laterality: Right;   TOTAL KNEE ARTHROPLASTY Left 09/20/2022   Procedure: LEFT TOTAL KNEE ARTHROPLASTY;  Surgeon: Melrose Nakayama, MD;  Location: [facility];  Service: Orthopedics;  Laterality: Left;   UMBILICAL HERNIA REPAIR     Patient Active Problem List   Diagnosis Date Noted   COVID-19 08/09/2022   Back pain 06/23/2022   Urinary retention 06/23/2022   Allergic sinusitis 04/12/2022   Rotator cuff tendinitis, right 02/03/2022   Primary osteoarthritis of right hip 06/24/2021   Primary osteoarthritis of left knee 03/29/2021   Hamstring strain, left, initial encounter 02/26/2021   Degenerative tear of  posterior horn of lateral meniscus of left knee 01/29/2021   OA (osteoarthritis) of knee 08/16/2020   Neoplasm of uncertain behavior of skin 03/12/2020   Tubular adenoma of colon 10/17/2018   Hyperlipidemia 10/12/2018   Greater trochanteric bursitis of both hips 10/12/2018   Morbid obesity ([place]) 10/12/2018   Vitamin D deficiency 08/01/2017   Neurotic excoriations 07/27/2016   Family history of colon cancer requiring screening colonoscopy 05/16/2012   Prediabetes 05/16/2012   Tobacco abuse 07/20/2011   Hypogonadism male 07/20/2011   Essential hypertension, benign 06/29/2011    PCP: Dr Luetta Nutting  REFERRING PROVIDER: Dr Melrose Nakayama  REFERRING DIAG: Lt TKA  THERAPY DIAG:  Chronic pain of left knee  Abnormal gait  Muscle weakness (generalized)  Localized edema  Other symptoms and signs involving the musculoskeletal system  Stiffness of left knee, not elsewhere classified  Rationale for Evaluation and Treatment Rehabilitation  ONSET DATE: 09/20/22  SUBJECTIVE:   SUBJECTIVE STATEMENT: He has been exercising in the gym on the elliptical and stepper before working in the water. He is walking without cane at home except when going up and down steps. Swelling is better today.   PERTINENT HISTORY: Lt TKA 09/20/22 Rt THA 5/23; HTN; arthritis  PAIN:  Are you having pain? Yes:  NPRS scale: 1/2 to 1/10 Pain location: Lt knee  Pain description: sharp; aching; tight; throbbing  Aggravating factors: walking; movement Relieving factors: meds and elevation   PRECAUTIONS: post L TKA 09/20/22   PATIENT GOALS use the [name] again and get around   without walker or cane   OBJECTIVE:    LOWER EXTREMITY STRENGTH:   5/5 hip and knee   LOWER EXTREMITY ROM:  Active Assistive ROM Right eval Left eval Left 10/23 Left 10/30 Left 10/19/22 Left 11/02/22 Left  11/14/22  Hip flexion 120 110     123  Hip extension 5 0       Hip abduction 26 32       Hip adduction          Hip internal rotation         Hip external rotation         Knee flexion 110 63 90 in sitting  100 sup 105 supine  114 supine  120 AA supine   Knee extension 0 -14 -25 extensor lag -10 prone -9 Supine heel propped on bolster -11 Supine heel propped on bolster  -9 Supine heel propped on bolster   Ankle dorsiflexion         Ankle plantarflexion         Ankle inversion         Ankle eversion          (Blank rows = not tested)   TODAY'S TREATMENT: [facility] Adult PT Treatment:                   ________       11/16/22:  Therapeutic exercise:   Nustep L7 UEs/LEs x 15 min   Leg press 65# pushing out with bilat LE's return to start w/ Lt LE x 20 reps     Supine   Hamstring stretch with strap 2x30 sec   SAQ 5 sec hold x 10 x 2 sets    Quad set x10 x 10 sec hold   Quad set to SLR 5 sec hold x 10      Prone   Glut set 10 sec x 10    Knee extension to hip ext 5 sec x 10     Sitting    Sit to stand x 10 x 2      Standing   Working on hip extension/knee extension  Manual therapy: Knee ext PROM Grade II to III mob for knee ext  Gait:  Forwards amb x3 laps around gym focusing on weight shifting to L, heel strike, knee ext  Backwards amb x 1 lap around gym focusing on knee ext   Date: 11/14/22 Therapeutic exercise:   Nustep L7 UEs/LEs x 6 min     Supine   Hamstring stretch with strap 2x30 sec   Quad set x10     Prone   Glut set 10 sec x 10    Knee extension to hip ext 5 sec x 10 x 2 sets      Standing   Hip extension/knee extension with assist of stretch out strap PT pulling pt into knee extension (wife instructed in trying this for home)     Manual  Joint mobs Lt knee PA Grade II/III; PROM into knee extension 10 sec x 5 reps   PATIENT EDUCATION:  Education details: rationale for interventions, POC Person educated: Patient Education method: Explanation, Demonstration, Tactile cues, Verbal cues, handouts Education comprehension: verbalized understanding, returned  demonstration, verbal cues required, tactile cues required, and needs further education   HOME EXERCISE PROGRAM: Access  Code: [email or web address] URL: [email or web address] Date: 09/22/2022 Prepared by: Gillermo Murdoch  Exercises - Ankle Pumps in Elevation  - 2 x daily - 7 x weekly - 1 sets - 10-20 reps - Ankle Circles in Elevation  - 2 x daily - 7 x weekly - 1 sets - 10-15 reps - Supine Quad Set  - 2 x daily - 7 x weekly - 1 sets - 10 reps - 3 sec  hold - Small Range Straight Leg Raise  - 2 x daily - 7 x weekly - 1 sets - 10 reps - 5 sec  hold - Supine Hip Abduction  - 2 x daily - 7 x weekly - 1 sets - 10 reps - 3 sec  hold - Supine Heel Slide with Strap  - 2 x daily - 7 x weekly - 1 sets - 5-10 reps - 10 sec  hold - Seated Heel Slide  - 2 x daily - 7 x weekly - 1 sets - 5-10 reps - 10 sec  hold  Dry needling handout   ASSESSMENT:  CLINICAL IMPRESSION: Continued improvement in Lt hip and knee ROM but has persistent tightness at end ranges, especially Lt knee extension. Note less extensor lag with quad set to SLR  Continued to work on [place] and knee ext and flexion. Increased focusing on improving gastroc and hamstring length to improving knee ext. Progressing gradually toward stated goals of therapy and return to independent ADL's. Will benefit from continued treatment to achieve goals.   OBJECTIVE IMPAIRMENTS Abnormal gait, decreased activity tolerance, decreased balance, decreased mobility, difficulty walking, decreased ROM, decreased strength, increased edema, impaired flexibility, postural dysfunction, and pain.   GOALS: Goals reviewed with patient? Yes  SHORT TERM GOALS: Target date: 10/20/2022  Independent in initial HEP  Baseline: Goal status: accomplished  2.  Increase AAROM Lt knee to (-)8 to (-)10 ext and 90 to 95 deg flexion  Baseline:  Goal status: partially accomplished  3.  Independent in gait with rolling walker improved gait pattern and increased wt  bearing Lt LE  Baseline:  Goal status: accomplished    LONG TERM GOALS: Target date: 1/ 15/2024   Increased AROM Lt knee to 0 deg extension and 100 degrees flexion Baseline:  Goal status: on going  2.  Increase strength Lt hip and knee to 4+/5 to 5/5  Baseline:  Goal status: accomplished  3.  Gait with no assistive device and good gait pattern for community distance of at least 600-800 ft Baseline:  Goal status: revised  4.  Independent in all transfers in home  Baseline:  Goal status: on going   5.  Independent in ascending and descending 14 steps in home Baseline:  Goal status: accomplished  6.  Independent in HEP  Baseline:  Goal status: on going   7. Improve functional limitation score to 45 11/10/22: 45  Goal status: accomplished    PLAN: PT FREQUENCY: 2x/week  PT DURATION: 8 weeks  PLANNED INTERVENTIONS: Therapeutic exercises, Therapeutic activity, Neuromuscular re-education, Balance training, Gait training, Patient/Family education, Self Care, Joint mobilization, Stair training, DME instructions, Aquatic Therapy, Dry Needling, Electrical stimulation, Cryotherapy, Moist heat, Taping, Vasopneumatic device, Ultrasound, Ionotophoresis '4mg'$ /ml Dexamethasone, Manual therapy, and Re-evaluation  PLAN FOR NEXT SESSION:  continue knee rehab - working on ROM and strengthening; focus on gaining ext/flex. Consider continued trial of DN Lt hamstrings and calf as indicated  Chapin Arduini Nilda Simmer, PT, MPH  11/16/2022 12:03 PM

## 2022-11-21 ENCOUNTER — Ambulatory Visit: Payer: Medicare Other | Admitting: Rehabilitative and Restorative Service Providers"

## 2022-11-21 ENCOUNTER — Encounter: Payer: Self-pay | Admitting: Rehabilitative and Restorative Service Providers"

## 2022-11-21 DIAGNOSIS — M25562 Pain in left knee: Secondary | ICD-10-CM | POA: Diagnosis not present

## 2022-11-21 DIAGNOSIS — M6281 Muscle weakness (generalized): Secondary | ICD-10-CM | POA: Diagnosis not present

## 2022-11-21 DIAGNOSIS — R29898 Other symptoms and signs involving the musculoskeletal system: Secondary | ICD-10-CM | POA: Diagnosis not present

## 2022-11-21 DIAGNOSIS — R269 Unspecified abnormalities of gait and mobility: Secondary | ICD-10-CM | POA: Diagnosis not present

## 2022-11-21 DIAGNOSIS — G8929 Other chronic pain: Secondary | ICD-10-CM | POA: Diagnosis not present

## 2022-11-21 DIAGNOSIS — R600 Localized edema: Secondary | ICD-10-CM

## 2022-11-21 NOTE — Therapy (Signed)
OUTPATIENT PHYSICAL THERAPY LOWER EXTREMITY TREATMENT     Patient Name: Erik Estrada MRN: [id number] DOB:Aug 26, 1951, 71 y.o., male Today's Date: 11/21/2022   PT End of Session - 11/21/22 1316     Visit Number 18    Number of Visits 28    Date for PT Re-Evaluation 12/26/22    Authorization Type Medicare    Progress Note Due on Visit 20    PT Start Time 1320    PT Stop Time 1400    PT Time Calculation (min) 40 min    Activity Tolerance Patient tolerated treatment well    Behavior During Therapy [facility] for tasks assessed/performed                Past Medical History:  Diagnosis Date   Anxiety    Arthritis    Essential hypertension, benign 06/29/2011   Hemorrhoids 08/01/2018   History of kidney stones    Morbid obesity ([place]) 10/12/2018   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  Plan for 5-year recheck.   Vitamin D deficiency 08/01/2017   Past Surgical History:  Procedure Laterality Date   KIDNEY STONE SURGERY     SHOULDER SURGERY  December 2010   Left   TOTAL HIP ARTHROPLASTY Right 04/19/2022   Procedure: RIGHT TOTAL HIP ARTHROPLASTY ANTERIOR APPROACH;  Surgeon: Melrose Nakayama, MD;  Location: [facility];  Service: Orthopedics;  Laterality: Right;   TOTAL KNEE ARTHROPLASTY Left 09/20/2022   Procedure: LEFT TOTAL KNEE ARTHROPLASTY;  Surgeon: Melrose Nakayama, MD;  Location: [facility];  Service: Orthopedics;  Laterality: Left;   UMBILICAL HERNIA REPAIR     Patient Active Problem List   Diagnosis Date Noted   COVID-19 08/09/2022   Back pain 06/23/2022   Urinary retention 06/23/2022   Allergic sinusitis 04/12/2022   Rotator cuff tendinitis, right 02/03/2022   Primary osteoarthritis of right hip 06/24/2021   Primary osteoarthritis of left knee 03/29/2021   Hamstring strain, left, initial encounter 02/26/2021   Degenerative tear of  posterior horn of lateral meniscus of left knee 01/29/2021   OA (osteoarthritis) of knee 08/16/2020   Neoplasm of uncertain behavior of skin 03/12/2020   Tubular adenoma of colon 10/17/2018   Hyperlipidemia 10/12/2018   Greater trochanteric bursitis of both hips 10/12/2018   Morbid obesity ([place]) 10/12/2018   Vitamin D deficiency 08/01/2017   Neurotic excoriations 07/27/2016   Family history of colon cancer requiring screening colonoscopy 05/16/2012   Prediabetes 05/16/2012   Tobacco abuse 07/20/2011   Hypogonadism male 07/20/2011   Essential hypertension, benign 06/29/2011    PCP: Dr Luetta Nutting  REFERRING PROVIDER: Dr Melrose Nakayama REFERRING DIAG: Lt TKA THERAPY DIAG:  Chronic pain of left knee  Abnormal gait  Muscle weakness (generalized)  Localized edema  Rationale for Evaluation and Treatment Rehabilitation  ONSET DATE: 09/20/22  SUBJECTIVE:   SUBJECTIVE STATEMENT: The patient went to the MD today and has good progress. He went to the gym on Friday and got in the pool and walked around. He feels his knee hurts if he sits or if he exercises. He reports "I expected to be further along than this."  PERTINENT HISTORY: Lt TKA 09/20/22 Rt THA 5/23; HTN; arthritis  PAIN:  Are you having pain? Yes:  NPRS scale: 3/10 Pain location: Lt knee  Pain description: sharp; aching; tight; throbbing  Aggravating factors: walking; movement Relieving factors: meds and elevation  PRECAUTIONS: post L TKA 09/20/22   PATIENT GOALS use the [name] again and get around   without walker or cane   OBJECTIVE:    LOWER EXTREMITY STRENGTH:   5/5 hip and knee   LOWER EXTREMITY ROM:  Active Assistive ROM Right eval Left eval Left 10/23 Left 10/30 Left 10/19/22 Left 11/02/22 Left  11/14/22 Left  11/21/22  Hip flexion 120 110     123   Hip extension 5 0        Hip abduction 26 32        Hip adduction          Hip internal rotation          Hip external rotation           Knee flexion 110 63 90 in sitting  100 sup 105 supine  114 supine  120 AA supine    Knee extension 0 -14 -25 extensor lag -10 prone -9 Supine heel propped on bolster -11 Supine heel propped on bolster  -9 Supine heel propped on bolster  -12 degrees extensor lag  Ankle dorsiflexion          Ankle plantarflexion          Ankle inversion          Ankle eversion           (Blank rows = not tested)   TODAY'S TREATMENT:  [facility] Adult PT Treatment:                                                DATE: 11/21/22 Therapeutic Exercise: Recumbent bike x 5 minutes x level 2-3 with full revolutions Standing Heel raises rolling a ball up and down the wall x 10 reps Weight shifting with feet in stride x 10 reps with alternate UE reaching Supine Bridging x 12 reps Knee to chest R and L sides Trunk rotation PROM hamstring stretch adding contract/relax Prone Passive overpressure into knee extension adding contract relax Manual Therapy: P/ROM hamstring stretch STM hamstrings and L gastrocs with some tender trigger points in L medial HS and soleous Gait: Backwards walking x 20 feet x 3 reps Marching with exaggerated arm swing x 20 feet x 2 reps Gait with arm swing x 100 feet x 3 reps with cues on trunk and UE rotation  [facility] Adult PT Treatment:                   ________     11/16/22:  Therapeutic exercise:   Nustep L7 UEs/LEs x 15 min   Leg press 65# pushing out with bilat LE's return to start w/ Lt LE x 20 reps     Supine   Hamstring stretch with strap 2x30 sec   SAQ 5 sec hold x 10 x 2 sets    Quad set x10 x 10 sec hold   Quad set to SLR 5 sec hold x 10      Prone   Glut set 10 sec x 10    Knee extension to hip ext 5 sec x 10     Sitting    Sit to stand x 10 x 2      Standing   Working on hip extension/knee extension  Manual therapy: Knee ext PROM Grade II to III mob for knee ext  Gait:  Forwards amb x3 laps around gym focusing on weight shifting to L, heel strike, knee  ext  Backwards amb x 1 lap around gym focusing on knee ext  PATIENT EDUCATION:  Education details: rationale for interventions, POC Person educated: Patient Education method: Explanation, Demonstration, Tactile cues, Verbal cues, handouts Education comprehension: verbalized understanding, returned demonstration, verbal cues required, tactile cues required, and needs further education  HOME EXERCISE PROGRAM: Access Code: [email or web address] URL: [email or web address] Date: 09/22/2022 Prepared by: Gillermo Murdoch  Exercises - Ankle Pumps in Elevation  - 2 x daily - 7 x weekly - 1 sets - 10-20 reps - Ankle Circles in Elevation  - 2 x daily - 7 x weekly - 1 sets - 10-15 reps - Supine Quad Set  - 2 x daily - 7 x weekly - 1 sets - 10 reps - 3 sec  hold - Small Range Straight Leg Raise  - 2 x daily - 7 x weekly - 1 sets - 10 reps - 5 sec  hold - Supine Hip Abduction  - 2 x daily - 7 x weekly - 1 sets - 10 reps - 3 sec  hold - Supine Heel Slide with Strap  - 2 x daily - 7 x weekly - 1 sets - 5-10 reps - 10 sec  hold - Seated Heel Slide  - 2 x daily - 7 x weekly - 1 sets - 5-10 reps - 10 sec  hold Dry needling handout   ASSESSMENT:  CLINICAL IMPRESSION: The patient improves gait mechanics throughout today's session. He has myofascial tightness in hamstrings and heel cord. PT to continue working on functional strengthening and gait.  Will benefit from continued treatment to achieve goals.   OBJECTIVE IMPAIRMENTS Abnormal gait, decreased activity tolerance, decreased balance, decreased mobility, difficulty walking, decreased ROM, decreased strength, increased edema, impaired flexibility, postural dysfunction, and pain.   GOALS: Goals reviewed with patient? Yes  SHORT TERM GOALS: Target date: 10/20/2022  Independent in initial HEP  Baseline: Goal status: accomplished  2.  Increase AAROM Lt knee to (-)8 to (-)10 ext and 90 to 95 deg flexion  Baseline:  Goal status: partially  accomplished  3.  Independent in gait with rolling walker improved gait pattern and increased wt bearing Lt LE  Baseline:  Goal status: accomplished    LONG TERM GOALS: Target date: 1/ 15/2024   Increased AROM Lt knee to 0 deg extension and 100 degrees flexion Goal status: on going  2.  Increase strength Lt hip and knee to 4+/5 to 5/5  Goal status: accomplished  3.  Gait with no assistive device and good gait pattern for community distance of at least 600-800 ft Goal status: revised  4.  Independent in all transfers in home  Goal status: on going   5.  Independent in ascending and descending 14 steps in home Goal status: accomplished  6.  Independent in HEP  Goal status: on going   7. Improve functional limitation score to 45 11/10/22: 45  Goal status: accomplished    PLAN: PT FREQUENCY: 2x/week  PT DURATION: 8 weeks  PLANNED INTERVENTIONS: Therapeutic exercises, Therapeutic activity, Neuromuscular re-education, Balance training, Gait training, Patient/Family education, Self Care, Joint mobilization, Stair training, DME instructions, Aquatic Therapy, Dry Needling, Electrical stimulation, Cryotherapy, Moist heat, Taping, Vasopneumatic device, Ultrasound, Ionotophoresis '4mg'$ /ml Dexamethasone, Manual therapy, and Re-evaluation  PLAN FOR NEXT SESSION:  continue knee rehab - working on ROM and strengthening; focus on gaining ext/flex. Consider continued trial of DN Lt hamstrings and  calf as indicated  [place], PT 11/21/2022 1:18 PM

## 2022-11-23 ENCOUNTER — Encounter: Payer: Self-pay | Admitting: Rehabilitative and Restorative Service Providers"

## 2022-11-23 ENCOUNTER — Ambulatory Visit: Payer: Medicare Other | Admitting: Rehabilitative and Restorative Service Providers"

## 2022-11-23 DIAGNOSIS — G8929 Other chronic pain: Secondary | ICD-10-CM | POA: Diagnosis not present

## 2022-11-23 DIAGNOSIS — R269 Unspecified abnormalities of gait and mobility: Secondary | ICD-10-CM

## 2022-11-23 DIAGNOSIS — R293 Abnormal posture: Secondary | ICD-10-CM

## 2022-11-23 DIAGNOSIS — R29898 Other symptoms and signs involving the musculoskeletal system: Secondary | ICD-10-CM | POA: Diagnosis not present

## 2022-11-23 DIAGNOSIS — M25662 Stiffness of left knee, not elsewhere classified: Secondary | ICD-10-CM

## 2022-11-23 DIAGNOSIS — M6281 Muscle weakness (generalized): Secondary | ICD-10-CM

## 2022-11-23 DIAGNOSIS — R600 Localized edema: Secondary | ICD-10-CM

## 2022-11-23 DIAGNOSIS — M25562 Pain in left knee: Secondary | ICD-10-CM | POA: Diagnosis not present

## 2022-11-23 NOTE — Therapy (Signed)
OUTPATIENT PHYSICAL THERAPY LOWER EXTREMITY TREATMENT     Patient Name: Erik Estrada MRN: [id number] DOB:[birth year], 71 y.o., male Today's Date: 11/23/2022   PT End of Session - 11/23/22 1108     Visit Number 19    Number of Visits 28    Date for PT Re-Evaluation 12/26/22    Authorization Type Medicare    Authorization - Visit Number 19    Progress Note Due on Visit 20    PT Start Time 1058    PT Stop Time 0867    PT Time Calculation (min) 47 min    Activity Tolerance Patient tolerated treatment well                Past Medical History:  Diagnosis Date   Anxiety    Arthritis    Essential hypertension, benign 06/29/2011   Hemorrhoids 08/01/2018   History of kidney stones    Morbid obesity ([place]) 10/12/2018   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  Plan for 5-year recheck.   Vitamin D deficiency 08/01/2017   Past Surgical History:  Procedure Laterality Date   KIDNEY STONE SURGERY     SHOULDER SURGERY  December 2010   Left   TOTAL HIP ARTHROPLASTY Right 04/19/2022   Procedure: RIGHT TOTAL HIP ARTHROPLASTY ANTERIOR APPROACH;  Surgeon: Melrose Nakayama, MD;  Location: [facility];  Service: Orthopedics;  Laterality: Right;   TOTAL KNEE ARTHROPLASTY Left 09/20/2022   Procedure: LEFT TOTAL KNEE ARTHROPLASTY;  Surgeon: Melrose Nakayama, MD;  Location: [facility];  Service: Orthopedics;  Laterality: Left;   UMBILICAL HERNIA REPAIR     Patient Active Problem List   Diagnosis Date Noted   COVID-19 08/09/2022   Back pain 06/23/2022   Urinary retention 06/23/2022   Allergic sinusitis 04/12/2022   Rotator cuff tendinitis, right 02/03/2022   Primary osteoarthritis of right hip 06/24/2021   Primary osteoarthritis of left knee 03/29/2021   Hamstring strain, left, initial encounter 02/26/2021   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 01/29/2021   OA (osteoarthritis) of knee 08/16/2020   Neoplasm of uncertain behavior of skin 03/12/2020   Tubular adenoma of colon 10/17/2018   Hyperlipidemia 10/12/2018   Greater trochanteric bursitis of both hips 10/12/2018   Morbid obesity ([place]) 10/12/2018   Vitamin D deficiency 08/01/2017   Neurotic excoriations 07/27/2016   Family history of colon cancer requiring screening colonoscopy 05/16/2012   Prediabetes 05/16/2012   Tobacco abuse 07/20/2011   Hypogonadism male 07/20/2011   Essential hypertension, benign 06/29/2011    PCP: Dr Luetta Nutting  REFERRING PROVIDER: Dr Melrose Nakayama REFERRING DIAG: Lt TKA THERAPY DIAG:  Chronic pain of left knee  Abnormal gait  Muscle weakness (generalized)  Localized edema  Other symptoms and signs involving the musculoskeletal system  Stiffness of left knee, not elsewhere classified  Abnormal posture  Rationale for Evaluation and Treatment Rehabilitation  ONSET DATE: 09/20/22  SUBJECTIVE:   SUBJECTIVE STATEMENT: Erik Estrada that he is still working on his exercises at home and in the pool. At the pool yesterday.   PERTINENT HISTORY: Lt TKA 09/20/22 Rt THA 5/23; HTN; arthritis  PAIN:  Are you having pain? Yes:  NPRS scale: 1/2 to 1/10 Pain location: Lt knee  Pain description: sharp; aching; tight; throbbing  Aggravating factors: walking; movement Relieving factors: meds and elevation   PRECAUTIONS: post L TKA 09/20/22  PATIENT GOALS use the [name] again and get around   without walker or cane   OBJECTIVE:    LOWER EXTREMITY STRENGTH:   5/5 hip and knee   LOWER EXTREMITY ROM:  Active Assistive ROM Right eval Left eval Left 10/23 Left 10/30 Left 10/19/22 Left 11/02/22 Left  11/14/22 Left  11/21/22  Hip flexion 120 110     123   Hip extension 5 0        Hip abduction 26 32        Hip adduction          Hip internal rotation          Hip external rotation          Knee flexion  110 63 90 in sitting  100 sup 105 supine  114 supine  120 AA supine    Knee extension 0 -14 -25 extensor lag -10 prone -9 Supine heel propped on bolster -11 Supine heel propped on bolster  -9 Supine heel propped on bolster  -12 degrees extensor lag  Ankle dorsiflexion          Ankle plantarflexion          Ankle inversion          Ankle eversion           (Blank rows = not tested)   TODAY'S TREATMENT:  [facility] Adult PT Treatment:                                                DATE:  11/23/22: Therapeutic exercise:   Nustep L7 UEs/LEs x 15 min   Leg press 90# pushing out with bilat LE's return to start w/ Lt LE x 20 reps  Leg press 90# heel lift bilat x 20    Supine:   Hamstring stretch with strap 2x30 sec      Prone:   Glut set 10 sec x 10    Knee extension to hip ext 5 sec x 10     Sitting:   Sit to stand x 10 x 2      Standing:   Working on hip extension/knee extension    TKE w/ coregeous ball x 20   Manual therapy: Knee ext PROM Grade II to III mob for knee ext  Gait:  Forwards amb x3 laps around gym focusing on weight shifting to L, heel strike, knee ext  Backwards walking x 20 feet x 3 reps  Trigger Point Dry-Needling  Patient Consent Given: Yes Education handout provided: Previously provided Muscles treated: Lt lateral hamstrings; bilat gastroc/soleus Electrical stimulation performed: Yes Parameters: mAmp current; to pt tolerance  Treatment response/outcome: decreased palpable tightness     11/21/22 Therapeutic Exercise: Recumbent bike x 5 minutes x level 2-3 with full revolutions Standing Heel raises rolling a ball up and down the wall x 10 reps Weight shifting with feet in stride x 10 reps with alternate UE reaching Supine Bridging x 12 reps Knee to chest R and L sides Trunk rotation PROM hamstring stretch adding contract/relax Prone Passive overpressure into knee extension adding contract relax Manual Therapy: P/ROM hamstring stretch STM  hamstrings and L gastrocs with some tender trigger points in L medial HS and soleous Gait: Backwards walking x 20 feet x 3 reps Marching with exaggerated arm swing x 20 feet x 2 reps  Gait with arm swing x 100 feet x 3 reps with cues on trunk and UE rotation   PATIENT EDUCATION:  Education details: rationale for interventions, POC Person educated: Patient Education method: Explanation, Demonstration, Tactile cues, Verbal cues, handouts Education comprehension: verbalized understanding, returned demonstration, verbal cues required, tactile cues required, and needs further education  HOME EXERCISE PROGRAM: Access Code: [email or web address] URL: [email or web address] Date: 09/22/2022 Prepared by: Gillermo Murdoch  Exercises - Ankle Pumps in Elevation  - 2 x daily - 7 x weekly - 1 sets - 10-20 reps - Ankle Circles in Elevation  - 2 x daily - 7 x weekly - 1 sets - 10-15 reps - Supine Quad Set  - 2 x daily - 7 x weekly - 1 sets - 10 reps - 3 sec  hold - Small Range Straight Leg Raise  - 2 x daily - 7 x weekly - 1 sets - 10 reps - 5 sec  hold - Supine Hip Abduction  - 2 x daily - 7 x weekly - 1 sets - 10 reps - 3 sec  hold - Supine Heel Slide with Strap  - 2 x daily - 7 x weekly - 1 sets - 5-10 reps - 10 sec  hold - Seated Heel Slide  - 2 x daily - 7 x weekly - 1 sets - 5-10 reps - 10 sec  hold Dry needling handout   ASSESSMENT:  CLINICAL IMPRESSION: Erik Estrada continues to demonstrate improving gait mechanics and is ambulating without assistive device. He has myofascial tightness in hamstrings and gastroc/soleus with trial of [place] followed by dry needling and IASTM. Note decreased tissue tightness to palpation and improve tissue extensibility following treatment. TKE continues to be limited   OBJECTIVE IMPAIRMENTS Abnormal gait, decreased activity tolerance, decreased balance, decreased mobility, difficulty walking, decreased ROM, decreased strength, increased edema, impaired flexibility, postural  dysfunction, and pain.   GOALS: Goals reviewed with patient? Yes  SHORT TERM GOALS: Target date: 10/20/2022  Independent in initial HEP  Baseline: Goal status: accomplished  2.  Increase AAROM Lt knee to (-)8 to (-)10 ext and 90 to 95 deg flexion  Baseline:  Goal status: partially accomplished  3.  Independent in gait with rolling walker improved gait pattern and increased wt bearing Lt LE  Baseline:  Goal status: accomplished    LONG TERM GOALS: Target date: 1/ 15/2024   Increased AROM Lt knee to 0 deg extension and 100 degrees flexion Goal status: on going  2.  Increase strength Lt hip and knee to 4+/5 to 5/5  Goal status: accomplished  3.  Gait with no assistive device and good gait pattern for community distance of at least 600-800 ft Goal status: revised  4.  Independent in all transfers in home  Goal status: on going   5.  Independent in ascending and descending 14 steps in home Goal status: accomplished  6.  Independent in HEP  Goal status: on going   7. Improve functional limitation score to 45 11/10/22: 45  Goal status: accomplished    PLAN: PT FREQUENCY: 2x/week  PT DURATION: 8 weeks  PLANNED INTERVENTIONS: Therapeutic exercises, Therapeutic activity, Neuromuscular re-education, Balance training, Gait training, Patient/Family education, Self Care, Joint mobilization, Stair training, DME instructions, Aquatic Therapy, Dry Needling, Electrical stimulation, Cryotherapy, Moist heat, Taping, Vasopneumatic device, Ultrasound, Ionotophoresis '4mg'$ /ml Dexamethasone, Manual therapy, and Re-evaluation  PLAN FOR NEXT SESSION:  continue knee rehab - working on ROM and strengthening; focus on gaining ext/flex. Consider continued trial of DN Lt  hamstrings and calf as indicated  Everardo All, PT, MPH  11/23/2022 11:16 AM

## 2022-11-28 ENCOUNTER — Encounter: Payer: Self-pay | Admitting: Rehabilitative and Restorative Service Providers"

## 2022-11-28 ENCOUNTER — Ambulatory Visit: Payer: Medicare Other | Admitting: Rehabilitative and Restorative Service Providers"

## 2022-11-28 DIAGNOSIS — G8929 Other chronic pain: Secondary | ICD-10-CM | POA: Diagnosis not present

## 2022-11-28 DIAGNOSIS — M6281 Muscle weakness (generalized): Secondary | ICD-10-CM

## 2022-11-28 DIAGNOSIS — M25562 Pain in left knee: Secondary | ICD-10-CM | POA: Diagnosis not present

## 2022-11-28 DIAGNOSIS — R269 Unspecified abnormalities of gait and mobility: Secondary | ICD-10-CM

## 2022-11-28 DIAGNOSIS — R29898 Other symptoms and signs involving the musculoskeletal system: Secondary | ICD-10-CM | POA: Diagnosis not present

## 2022-11-28 DIAGNOSIS — M25662 Stiffness of left knee, not elsewhere classified: Secondary | ICD-10-CM

## 2022-11-28 DIAGNOSIS — R600 Localized edema: Secondary | ICD-10-CM | POA: Diagnosis not present

## 2022-11-28 NOTE — Therapy (Signed)
OUTPATIENT PHYSICAL THERAPY LOWER EXTREMITY TREATMENT    PHYSICAL THERAPY MEDICARE 20th VISIT NOTE  Progress Note Reporting Period 10/26/22 to 11/28/22  See note below for Objective Data and Assessment of Progress/Goals.     Patient Name: Erik Estrada MRN: [id number] DOB:May 26, 1951, 71 y.o., male Today's Date: 11/28/2022   PT End of Session - 11/28/22 1157     Visit Number 20    Number of Visits 28    Date for PT Re-Evaluation 12/26/22    Authorization Type Medicare    Authorization - Visit Number 20    Progress Note Due on Visit 20    PT Start Time 1100    PT Stop Time 3428    PT Time Calculation (min) 45 min    Activity Tolerance Patient tolerated treatment well                Past Medical History:  Diagnosis Date   Anxiety    Arthritis    Essential hypertension, benign 06/29/2011   Hemorrhoids 08/01/2018   History of kidney stones    Morbid obesity ([place]) 10/12/2018   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  Plan for 5-year recheck.   Vitamin D deficiency 08/01/2017   Past Surgical History:  Procedure Laterality Date   KIDNEY STONE SURGERY     SHOULDER SURGERY  December 2010   Left   TOTAL HIP ARTHROPLASTY Right 04/19/2022   Procedure: RIGHT TOTAL HIP ARTHROPLASTY ANTERIOR APPROACH;  Surgeon: Melrose Nakayama, MD;  Location: [facility];  Service: Orthopedics;  Laterality: Right;   TOTAL KNEE ARTHROPLASTY Left 09/20/2022   Procedure: LEFT TOTAL KNEE ARTHROPLASTY;  Surgeon: Melrose Nakayama, MD;  Location: [facility];  Service: Orthopedics;  Laterality: Left;   UMBILICAL HERNIA REPAIR     Patient Active Problem List   Diagnosis Date Noted   COVID-19 08/09/2022   Back pain 06/23/2022   Urinary retention 06/23/2022   Allergic sinusitis 04/12/2022   Rotator cuff tendinitis, right 02/03/2022   Primary osteoarthritis of right  hip 06/24/2021   Primary osteoarthritis of left knee 03/29/2021   Hamstring strain, left, initial encounter 02/26/2021   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 01/29/2021   OA (osteoarthritis) of knee 08/16/2020   Neoplasm of uncertain behavior of skin 03/12/2020   Tubular adenoma of colon 10/17/2018   Hyperlipidemia 10/12/2018   Greater trochanteric bursitis of both hips 10/12/2018   Morbid obesity ([place]) 10/12/2018   Vitamin D deficiency 08/01/2017   Neurotic excoriations 07/27/2016   Family history of colon cancer requiring screening colonoscopy 05/16/2012   Prediabetes 05/16/2012   Tobacco abuse 07/20/2011   Hypogonadism male 07/20/2011   Essential hypertension, benign 06/29/2011    PCP: Dr Luetta Nutting  REFERRING PROVIDER: Dr Melrose Nakayama REFERRING DIAG: Lt TKA THERAPY DIAG:  Chronic pain of left knee  Abnormal gait  Muscle weakness (generalized)  Localized edema  Other symptoms and signs involving the musculoskeletal system  Stiffness of left knee, not elsewhere classified  Rationale for Evaluation and Treatment Rehabilitation  ONSET DATE: 09/20/22  SUBJECTIVE:   SUBJECTIVE STATEMENT: Erik Estrada reports that he walked [place] a day at the mall yesterday and is sore and stiff today. He continues to work on exercises at home and in the pool.  PERTINENT HISTORY: Lt TKA 09/20/22 Rt THA 5/23; HTN; arthritis  PAIN:  Are you having pain? Yes:  NPRS scale: 2/10 Pain location: Lt knee  Pain description: sharp; aching; tight; throbbing  Aggravating factors: walking; movement Relieving factors: meds and elevation   PRECAUTIONS: post L TKA 09/20/22   PATIENT GOALS use the [name] again and get around   without walker or cane   OBJECTIVE:    LOWER EXTREMITY STRENGTH:   5/5 hip and knee   LOWER EXTREMITY ROM:  Active Assistive ROM Right eval Left eval Left 10/23  Left 10/19/22 Left 11/02/22 Left  11/28/22     Hip flexion 120 110          Hip extension 5 0         Hip abduction 26 32         Hip adduction           Hip internal rotation           Hip external rotation           Knee flexion 110 63 90 in sitting   105 supine  114 supine  117 supine     Knee extension 0 -14 -25 extensor lag  -9 Supine heel propped on bolster -11 Supine heel propped on bolster  -8 Supine heel propped on bolster    Ankle dorsiflexion           Ankle plantarflexion           Ankle inversion           Ankle eversion            (Blank rows = not tested)   TODAY'S TREATMENT:  [facility] Adult PT Treatment:                                                DATE:  11/28/22:  Therapeutic exercise:   Nustep L7 UEs/LEs x 15 min   Leg press 95# pushing out with bilat LE's return to start w/ Lt LE x 20 reps  Leg press 95# heel lift bilat x 20    Supine:   Hamstring stretch with strap 3 x 30 sec      Prone:   Glut set 10 sec x 10    Knee extension to hip ext 5 sec x 10     Sitting:   Sit to stand holding 5# KB inverted at chest 10 x 2      Standing:   Working on hip extension/knee extension    TKE w/ coregeous ball x 20 10 sec hold   Manual therapy: Knee ext PROM Grade II to III mob for knee ext  Gait:  Forwards amb x3 laps around gym focusing on weight shifting to L, heel strike, knee ext  Backwards walking x 20 feet x 3 reps  11/23/22: Therapeutic exercise:   Nustep L7 UEs/LEs x 15 min   Leg press 90# pushing out with bilat LE's return to start w/ Lt LE x 20 reps  Leg press 90# heel lift bilat x 20    Supine:   Hamstring stretch with strap 2x30 sec      Prone:   Glut set 10 sec x 10    Knee extension to hip ext 5 sec x 10     Sitting:   Sit to stand x 10 x 2      Standing:   Working on hip extension/knee  extension    TKE w/ coregeous ball x 20   Manual therapy: Knee ext PROM Grade II to III mob for knee ext  Gait:  Forwards amb x3 laps around gym focusing on weight shifting to L, heel strike, knee  ext  Backwards walking x 20 feet x 3 reps  Trigger Point Dry-Needling  Patient Consent Given: Yes Education handout provided: Previously provided Muscles treated: Lt lateral hamstrings; bilat gastroc/soleus Electrical stimulation performed: Yes Parameters: mAmp current; to pt tolerance  Treatment response/outcome: decreased palpable tightness     PATIENT EDUCATION:  Education details: rationale for interventions, POC Person educated: Patient Education method: Explanation, Demonstration, Tactile cues, Verbal cues, handouts Education comprehension: verbalized understanding, returned demonstration, verbal cues required, tactile cues required, and needs further education  HOME EXERCISE PROGRAM: Access Code: [email or web address] URL: [email or web address] Date: 09/22/2022 Prepared by: Gillermo Murdoch  Exercises - Ankle Pumps in Elevation  - 2 x daily - 7 x weekly - 1 sets - 10-20 reps - Ankle Circles in Elevation  - 2 x daily - 7 x weekly - 1 sets - 10-15 reps - Supine Quad Set  - 2 x daily - 7 x weekly - 1 sets - 10 reps - 3 sec  hold - Small Range Straight Leg Raise  - 2 x daily - 7 x weekly - 1 sets - 10 reps - 5 sec  hold - Supine Hip Abduction  - 2 x daily - 7 x weekly - 1 sets - 10 reps - 3 sec  hold - Supine Heel Slide with Strap  - 2 x daily - 7 x weekly - 1 sets - 5-10 reps - 10 sec  hold - Seated Heel Slide  - 2 x daily - 7 x weekly - 1 sets - 5-10 reps - 10 sec  hold Dry needling handout   ASSESSMENT:  CLINICAL IMPRESSION: [name] continues to demonstrate improving gait mechanics and is ambulating without assistive device. He has myofascial tightness in hamstrings and gastroc/soleus with trial of [place], DN, IASTM to improve tissue extensibility. Note decreased tissue tightness to palpation and improved tissue extensibility following treatment. TKE continues to be limited. Patient is progressing well toward stated goals of therapy.   OBJECTIVE IMPAIRMENTS Abnormal gait, decreased  activity tolerance, decreased balance, decreased mobility, difficulty walking, decreased ROM, decreased strength, increased edema, impaired flexibility, postural dysfunction, and pain.   GOALS: SHORT TERM GOALS: Target date: 10/20/2022  Independent in initial HEP  Baseline: Goal status: accomplished  2.  Increase AAROM Lt knee to (-)8 to (-)10 ext and 90 to 95 deg flexion  Baseline:  Goal status: partially accomplished  3.  Independent in gait with rolling walker improved gait pattern and increased wt bearing Lt LE  Baseline:  Goal status: accomplished  LONG TERM GOALS: Target date: 1/ 15/2024   Increased AROM Lt knee to 0 deg extension and 100 degrees flexion Goal status: on going  2.  Increase strength Lt hip and knee to 4+/5 to 5/5  Goal status: accomplished  3.  Gait with no assistive device and good gait pattern for community distance of at least 600-800 ft Goal status: accomplished  4.  Independent in all transfers in home  Goal status: oaccomplished   5.  Independent in ascending and descending 14 steps in home Goal status: accomplished  6.  Independent in HEP  Goal status: on going   7. Improve functional limitation score to 45 11/10/22: 45  Goal status: accomplished    PLAN:  PT FREQUENCY: 2x/week  PT DURATION: 8 weeks  PLANNED INTERVENTIONS: Therapeutic exercises, Therapeutic activity, Neuromuscular re-education, Balance training, Gait training, Patient/Family education, Self Care, Joint mobilization, Stair training, DME instructions, Aquatic Therapy, Dry Needling, Electrical stimulation, Cryotherapy, Moist heat, Taping, Vasopneumatic device, Ultrasound, Ionotophoresis '4mg'$ /ml Dexamethasone, Manual therapy, and Re-evaluation  PLAN FOR NEXT SESSION:  continue knee rehab - working on ROM and strengthening; focus on gaining Lt knee. Continued trial of DN Lt hamstrings and calf as indicated  Marishka Rentfrow Nilda Simmer, PT, MPH  11/28/2022 12:32 PM

## 2022-12-01 ENCOUNTER — Ambulatory Visit: Payer: Medicare Other | Admitting: Rehabilitative and Restorative Service Providers"

## 2022-12-06 ENCOUNTER — Encounter: Payer: Medicare Other | Admitting: Physical Therapy

## 2022-12-08 ENCOUNTER — Encounter: Payer: Self-pay | Admitting: Physical Therapy

## 2022-12-08 ENCOUNTER — Ambulatory Visit: Payer: Medicare Other | Admitting: Physical Therapy

## 2022-12-08 DIAGNOSIS — R29898 Other symptoms and signs involving the musculoskeletal system: Secondary | ICD-10-CM

## 2022-12-08 DIAGNOSIS — R269 Unspecified abnormalities of gait and mobility: Secondary | ICD-10-CM | POA: Diagnosis not present

## 2022-12-08 DIAGNOSIS — G8929 Other chronic pain: Secondary | ICD-10-CM

## 2022-12-08 DIAGNOSIS — M6281 Muscle weakness (generalized): Secondary | ICD-10-CM

## 2022-12-08 DIAGNOSIS — M25562 Pain in left knee: Secondary | ICD-10-CM | POA: Diagnosis not present

## 2022-12-08 DIAGNOSIS — R600 Localized edema: Secondary | ICD-10-CM

## 2022-12-08 DIAGNOSIS — M25662 Stiffness of left knee, not elsewhere classified: Secondary | ICD-10-CM

## 2022-12-08 NOTE — Therapy (Signed)
OUTPATIENT PHYSICAL THERAPY LOWER EXTREMITY TREATMENT       Patient Name: Erik Estrada MRN: [id number] DOB:11-30-1951, 71 y.o., male Today's Date: 12/08/2022   PT End of Session - 12/08/22 1054     Visit Number 21    Number of Visits 28    Date for PT Re-Evaluation 12/26/22    Authorization Type Medicare    Progress Note Due on Visit 20    PT Start Time 1055    PT Stop Time 1140    PT Time Calculation (min) 45 min    Activity Tolerance Patient tolerated treatment well    Behavior During Therapy [facility] for tasks assessed/performed                Past Medical History:  Diagnosis Date   Anxiety    Arthritis    Essential hypertension, benign 06/29/2011   Hemorrhoids 08/01/2018   History of kidney stones    Morbid obesity ([place]) 10/12/2018   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  Plan for 5-year recheck.   Vitamin D deficiency 08/01/2017   Past Surgical History:  Procedure Laterality Date   KIDNEY STONE SURGERY     SHOULDER SURGERY  December 2010   Left   TOTAL HIP ARTHROPLASTY Right 04/19/2022   Procedure: RIGHT TOTAL HIP ARTHROPLASTY ANTERIOR APPROACH;  Surgeon: Melrose Nakayama, MD;  Location: [facility];  Service: Orthopedics;  Laterality: Right;   TOTAL KNEE ARTHROPLASTY Left 09/20/2022   Procedure: LEFT TOTAL KNEE ARTHROPLASTY;  Surgeon: Melrose Nakayama, MD;  Location: [facility];  Service: Orthopedics;  Laterality: Left;   UMBILICAL HERNIA REPAIR     Patient Active Problem List   Diagnosis Date Noted   COVID-19 08/09/2022   Back pain 06/23/2022   Urinary retention 06/23/2022   Allergic sinusitis 04/12/2022   Rotator cuff tendinitis, right 02/03/2022   Primary osteoarthritis of right hip 06/24/2021   Primary osteoarthritis of left knee 03/29/2021   Hamstring strain, left, initial encounter 02/26/2021   Degenerative tear of  posterior horn of lateral meniscus of left knee 01/29/2021   OA (osteoarthritis) of knee 08/16/2020   Neoplasm of uncertain behavior of skin 03/12/2020   Tubular adenoma of colon 10/17/2018   Hyperlipidemia 10/12/2018   Greater trochanteric bursitis of both hips 10/12/2018   Morbid obesity ([place]) 10/12/2018   Vitamin D deficiency 08/01/2017   Neurotic excoriations 07/27/2016   Family history of colon cancer requiring screening colonoscopy 05/16/2012   Prediabetes 05/16/2012   Tobacco abuse 07/20/2011   Hypogonadism male 07/20/2011   Essential hypertension, benign 06/29/2011    PCP: Dr Luetta Nutting  REFERRING PROVIDER: Dr Melrose Nakayama REFERRING DIAG: Lt TKA THERAPY DIAG:  Chronic pain of left knee  Abnormal gait  Muscle weakness (generalized)  Localized edema  Other symptoms and signs involving the musculoskeletal system  Stiffness of left knee, not elsewhere classified  Rationale for Evaluation and Treatment Rehabilitation  ONSET DATE: 09/20/22  SUBJECTIVE:   SUBJECTIVE STATEMENT: Erik Estrada states he's been a little sick. Feels he's getting better. Feels a pressure behind his eyes and has been getting dizzy for the last week. Pt reports some continued soreness in medial knee.   PERTINENT HISTORY: Lt TKA 09/20/22 Rt THA 5/23; HTN; arthritis  PAIN:  Are you having pain? Yes:  NPRS scale: 2/10 Pain location: Lt knee  Pain description: sharp; aching; tight; throbbing  Aggravating factors: walking;  movement Relieving factors: meds and elevation   PRECAUTIONS: post L TKA 09/20/22   PATIENT GOALS use the [name] again and get around   without walker or cane   OBJECTIVE:    LOWER EXTREMITY STRENGTH:   5/5 hip and knee   LOWER EXTREMITY ROM:  Active Assistive ROM Right eval Left eval Left 10/23 Left 10/19/22 Left 11/02/22 Left  11/28/22 Left 12/08/22  Hip flexion 120 110       Hip extension 5 0       Hip abduction 26 32       Hip adduction         Hip  internal rotation         Hip external rotation         Knee flexion 110 63 90 in sitting  105 supine  114 supine  117 supine    Knee extension 0 -14 -25 extensor lag -9 Supine heel propped on bolster -11 Supine heel propped on bolster  -8 Supine heel propped on bolster   Ankle dorsiflexion         Ankle plantarflexion         Ankle inversion         Ankle eversion          (Blank rows = not tested)   TODAY'S TREATMENT: [facility] Adult PT Treatment:                                                DATE: 12/08/22 Therapeutic Exercise: Nustep L8 UEs/LEs x 10 min  Leg press 115# 2x10 DL, 90# 2x10 SL R&L Supine Foot on bolster knee ext stretch x 2 min Foot on bolster, quad set with MWM for knee ext 2x10 Standing Gastroc stretch on step 2x30 sec Gastroc runner's stretch 2x30 sec with manual overpressure  Ankle cuff resistance step forward x10 with TKE 5# at cable Ankle cuff resistance step backward x10 with TKE 5# at cable Cuff resstance around knee step forward for MWM for TKE x10 with 10# at cable Manual Therapy: Knee ext PROM Grade II to III mob for knee ext   [facility] Adult PT Treatment:                                                DATE: 11/28/22:  Therapeutic exercise:   Nustep L7 UEs/LEs x 15 min   Leg press 95# pushing out with bilat LE's return to start w/ Lt LE x 20 reps  Leg press 95# heel lift bilat x 20    Supine:   Hamstring stretch with strap 3 x 30 sec      Prone:   Glut set 10 sec x 10    Knee extension to hip ext 5 sec x 10     Sitting:   Sit to stand holding 5# KB inverted at chest 10 x 2      Standing:   Working on hip extension/knee extension    TKE w/ coregeous ball x 20 10 sec hold   Manual therapy: Knee ext PROM Grade II to III mob for knee ext  Gait:  Forwards amb x3 laps around gym focusing on weight shifting to L, heel strike, knee  ext  Backwards walking x 20 feet x 3 reps  11/23/22: Therapeutic exercise:   Nustep L7 UEs/LEs x 15 min   Leg  press 90# pushing out with bilat LE's return to start w/ Lt LE x 20 reps  Leg press 90# heel lift bilat x 20    Supine:   Hamstring stretch with strap 2x30 sec      Prone:   Glut set 10 sec x 10    Knee extension to hip ext 5 sec x 10     Sitting:   Sit to stand x 10 x 2      Standing:   Working on hip extension/knee extension    TKE w/ coregeous ball x 20   Manual therapy: Knee ext PROM Grade II to III mob for knee ext  Gait:  Forwards amb x3 laps around gym focusing on weight shifting to L, heel strike, knee ext  Backwards walking x 20 feet x 3 reps  Trigger Point Dry-Needling  Patient Consent Given: Yes Education handout provided: Previously provided Muscles treated: Lt lateral hamstrings; bilat gastroc/soleus Electrical stimulation performed: Yes Parameters: mAmp current; to pt tolerance  Treatment response/outcome: decreased palpable tightness     PATIENT EDUCATION:  Education details: rationale for interventions, POC Person educated: Patient Education method: Explanation, Demonstration, Tactile cues, Verbal cues, handouts Education comprehension: verbalized understanding, returned demonstration, verbal cues required, tactile cues required, and needs further education  HOME EXERCISE PROGRAM: Access Code: [email or web address] URL: [email or web address] Date: 09/22/2022 Prepared by: Gillermo Murdoch  Exercises - Ankle Pumps in Elevation  - 2 x daily - 7 x weekly - 1 sets - 10-20 reps - Ankle Circles in Elevation  - 2 x daily - 7 x weekly - 1 sets - 10-15 reps - Supine Quad Set  - 2 x daily - 7 x weekly - 1 sets - 10 reps - 3 sec  hold - Small Range Straight Leg Raise  - 2 x daily - 7 x weekly - 1 sets - 10 reps - 5 sec  hold - Supine Hip Abduction  - 2 x daily - 7 x weekly - 1 sets - 10 reps - 3 sec  hold - Supine Heel Slide with Strap  - 2 x daily - 7 x weekly - 1 sets - 5-10 reps - 10 sec  hold - Seated Heel Slide  - 2 x daily - 7 x weekly - 1 sets - 5-10 reps -  10 sec  hold Dry needling handout   ASSESSMENT:  CLINICAL IMPRESSION: Continued to work on improving TKE and L LE strength. Worked primarily with machines for strengthening this session.   OBJECTIVE IMPAIRMENTS Abnormal gait, decreased activity tolerance, decreased balance, decreased mobility, difficulty walking, decreased ROM, decreased strength, increased edema, impaired flexibility, postural dysfunction, and pain.   GOALS: SHORT TERM GOALS: Target date: 10/20/2022  Independent in initial HEP  Baseline: Goal status: accomplished  2.  Increase AAROM Lt knee to (-)8 to (-)10 ext and 90 to 95 deg flexion  Baseline:  Goal status: partially accomplished  3.  Independent in gait with rolling walker improved gait pattern and increased wt bearing Lt LE  Baseline:  Goal status: accomplished  LONG TERM GOALS: Target date: 1/ 15/2024   Increased AROM Lt knee to 0 deg extension and 100 degrees flexion Goal status: on going  2.  Increase strength Lt hip and knee to 4+/5 to 5/5  Goal status: accomplished  3.  Gait with no assistive  device and good gait pattern for community distance of at least 600-800 ft Goal status: accomplished  4.  Independent in all transfers in home  Goal status: oaccomplished   5.  Independent in ascending and descending 14 steps in home Goal status: accomplished  6.  Independent in HEP  Goal status: on going   7. Improve functional limitation score to 45 11/10/22: 45  Goal status: accomplished    PLAN: PT FREQUENCY: 2x/week  PT DURATION: 8 weeks  PLANNED INTERVENTIONS: Therapeutic exercises, Therapeutic activity, Neuromuscular re-education, Balance training, Gait training, Patient/Family education, Self Care, Joint mobilization, Stair training, DME instructions, Aquatic Therapy, Dry Needling, Electrical stimulation, Cryotherapy, Moist heat, Taping, Vasopneumatic device, Ultrasound, Ionotophoresis '4mg'$ /ml Dexamethasone, Manual therapy, and  Re-evaluation  PLAN FOR NEXT SESSION:  continue knee rehab - working on ROM and strengthening; focus on gaining Lt knee. Continued trial of DN Lt hamstrings and calf as indicated  Benicio Manna April Ma L Bush, PT, DPT 12/08/2022 10:54 AM

## 2022-12-13 ENCOUNTER — Ambulatory Visit: Payer: Medicare Other | Attending: Family Medicine

## 2022-12-13 DIAGNOSIS — G8929 Other chronic pain: Secondary | ICD-10-CM | POA: Diagnosis not present

## 2022-12-13 DIAGNOSIS — R600 Localized edema: Secondary | ICD-10-CM | POA: Insufficient documentation

## 2022-12-13 DIAGNOSIS — M25662 Stiffness of left knee, not elsewhere classified: Secondary | ICD-10-CM | POA: Insufficient documentation

## 2022-12-13 DIAGNOSIS — R269 Unspecified abnormalities of gait and mobility: Secondary | ICD-10-CM | POA: Insufficient documentation

## 2022-12-13 DIAGNOSIS — R29898 Other symptoms and signs involving the musculoskeletal system: Secondary | ICD-10-CM | POA: Diagnosis not present

## 2022-12-13 DIAGNOSIS — M6281 Muscle weakness (generalized): Secondary | ICD-10-CM | POA: Diagnosis not present

## 2022-12-13 DIAGNOSIS — M25562 Pain in left knee: Secondary | ICD-10-CM | POA: Insufficient documentation

## 2022-12-13 NOTE — Therapy (Signed)
OUTPATIENT PHYSICAL THERAPY LOWER EXTREMITY TREATMENT       Patient Name: Erik Estrada MRN: [id number] DOB:[birth year], 72 y.o., male Today's Date: 12/13/2022   PT End of Session - 12/13/22 1011     PT Start Time 6606    PT Stop Time 32    PT Time Calculation (min) 47 min    Activity Tolerance Patient tolerated treatment well    Behavior During Therapy [facility] for tasks assessed/performed                Past Medical History:  Diagnosis Date   Anxiety    Arthritis    Essential hypertension, benign 06/29/2011   Hemorrhoids 08/01/2018   History of kidney stones    Morbid obesity ([place]) 10/12/2018   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  Plan for 5-year recheck.   Vitamin D deficiency 08/01/2017   Past Surgical History:  Procedure Laterality Date   KIDNEY STONE SURGERY     SHOULDER SURGERY  December 2010   Left   TOTAL HIP ARTHROPLASTY Right 04/19/2022   Procedure: RIGHT TOTAL HIP ARTHROPLASTY ANTERIOR APPROACH;  Surgeon: Melrose Nakayama, MD;  Location: [facility];  Service: Orthopedics;  Laterality: Right;   TOTAL KNEE ARTHROPLASTY Left 09/20/2022   Procedure: LEFT TOTAL KNEE ARTHROPLASTY;  Surgeon: Melrose Nakayama, MD;  Location: [facility];  Service: Orthopedics;  Laterality: Left;   UMBILICAL HERNIA REPAIR     Patient Active Problem List   Diagnosis Date Noted   COVID-19 08/09/2022   Back pain 06/23/2022   Urinary retention 06/23/2022   Allergic sinusitis 04/12/2022   Rotator cuff tendinitis, right 02/03/2022   Primary osteoarthritis of right hip 06/24/2021   Primary osteoarthritis of left knee 03/29/2021   Hamstring strain, left, initial encounter 02/26/2021   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 01/29/2021   OA (osteoarthritis) of knee 08/16/2020   Neoplasm of uncertain behavior of skin  03/12/2020   Tubular adenoma of colon 10/17/2018   Hyperlipidemia 10/12/2018   Greater trochanteric bursitis of both hips 10/12/2018   Morbid obesity ([place]) 10/12/2018   Vitamin D deficiency 08/01/2017   Neurotic excoriations 07/27/2016   Family history of colon cancer requiring screening colonoscopy 05/16/2012   Prediabetes 05/16/2012   Tobacco abuse 07/20/2011   Hypogonadism male 07/20/2011   Essential hypertension, benign 06/29/2011    PCP: Dr Luetta Nutting  REFERRING PROVIDER: Dr Melrose Nakayama REFERRING DIAG: Lt TKA THERAPY DIAG:  Chronic pain of left knee  Abnormal gait  Muscle weakness (generalized)  Stiffness of left knee, not elsewhere classified  Other symptoms and signs involving the musculoskeletal system  Rationale for Evaluation and Treatment Rehabilitation  ONSET DATE: 09/20/22  SUBJECTIVE:   SUBJECTIVE STATEMENT: Patient states he is feeling better today, states he has not been feeling as off-balance as last time. Patient states he had som pain in "knee joint" while sleeping last night, states he was shifting around a lot to try and get comfortable. Patient states 2/10 pain in knee today, which he thinks is related to doing a lot of sitting over the holidays.  PERTINENT HISTORY: Lt TKA 09/20/22 Rt THA 5/23; HTN; arthritis  PAIN:  Are you having pain? Yes:  NPRS scale: 2/10 Pain location: Lt knee  Pain description: sharp; aching; tight; throbbing  Aggravating factors: walking; movement Relieving factors: meds and elevation   PRECAUTIONS: post  L TKA 09/20/22   PATIENT GOALS use the [name] again and get around   without walker or cane   OBJECTIVE:    LOWER EXTREMITY STRENGTH:   5/5 hip and knee   LOWER EXTREMITY ROM:  Active Assistive ROM Right eval Left eval Left 10/23 Left 10/19/22 Left 11/02/22 Left  11/28/22 Left 12/08/22  Hip flexion 120 110       Hip extension 5 0       Hip abduction 26 32       Hip adduction         Hip  internal rotation         Hip external rotation         Knee flexion 110 63 90 in sitting  105 supine  114 supine  117 supine    Knee extension 0 -14 -25 extensor lag -9 Supine heel propped on bolster -11 Supine heel propped on bolster  -8 Supine heel propped on bolster   Ankle dorsiflexion         Ankle plantarflexion         Ankle inversion         Ankle eversion          (Blank rows = not tested)   TODAY'S TREATMENT:  [facility] Adult PT Treatment:                                                DATE: 12/13/2022. Therapeutic Exercise: NuStep L8 (UE8/LE) x 52mn Supine: SAQ (foam roller) 10x3" Foot on foam roller: quad set with MWM for knee ext 10x3" AAROM heel slides w/.strap 10x3" L SLR 2x10 Standing: Gastroc stretch (heels off low step, HHAx2) 3x30" Heel raises (heels off low step, HHAx2) 2x10 Ankle cuff resistance step forward x12 with TKE 5# at cable Ankle cuff resistance step backward x12 with TKE 5# at cable Resisted hip flexion RTB x10 B Supine: Hip flexor stretch x30" HS stretch w/strap x30" B   [facility] Adult PT Treatment:                                                DATE: 12/08/22 Therapeutic Exercise: Nustep L8 UEs/LEs x 10 min  Leg press 115# 2x10 DL, 90# 2x10 SL R&L Supine Foot on bolster knee ext stretch x 2 min Foot on bolster, quad set with MWM for knee ext 2x10 Standing Gastroc stretch on step 2x30 sec Gastroc runner's stretch 2x30 sec with manual overpressure  Ankle cuff resistance step forward x10 with TKE 5# at cable Ankle cuff resistance step backward x10 with TKE 5# at cable Cuff resstance around knee step forward for MWM for TKE x10 with 10# at cable Manual Therapy: Knee ext PROM Grade II to III mob for knee ext   11/23/22: Therapeutic exercise:   Nustep L7 UEs/LEs x 15 min   Leg press 90# pushing out with bilat LE's return to start w/ Lt LE x 20 reps  Leg press 90# heel lift bilat x 20    Supine:   Hamstring stretch with strap 2x30  sec      Prone:   Glut set 10 sec x 10    Knee extension to hip ext 5 sec  x 10     Sitting:   Sit to stand x 10 x 2      Standing:   Working on hip extension/knee extension    TKE w/ coregeous ball x 20   Manual therapy: Knee ext PROM Grade II to III mob for knee ext  Gait:  Forwards amb x3 laps around gym focusing on weight shifting to L, heel strike, knee ext  Backwards walking x 20 feet x 3 reps  Trigger Point Dry-Needling  Patient Consent Given: Yes Education handout provided: Previously provided Muscles treated: Lt lateral hamstrings; bilat gastroc/soleus Electrical stimulation performed: Yes Parameters: mAmp current; to pt tolerance  Treatment response/outcome: decreased palpable tightness     PATIENT EDUCATION:  Education details: rationale for interventions, POC Person educated: Patient Education method: Explanation, Demonstration, Tactile cues, Verbal cues, handouts Education comprehension: verbalized understanding, returned demonstration, verbal cues required, tactile cues required, and needs further education  HOME EXERCISE PROGRAM: Access Code: [email or web address] URL: [email or web address] Date: 12/13/2022 Prepared by: Helane Gunther  Exercises - Ankle Pumps in Elevation  - 2 x daily - 7 x weekly - 1 sets - 10-20 reps - Ankle Circles in Elevation  - 2 x daily - 7 x weekly - 1 sets - 10-15 reps - Supine Quad Set  - 2 x daily - 7 x weekly - 1 sets - 10 reps - 3 sec  hold - Small Range Straight Leg Raise  - 2 x daily - 7 x weekly - 1 sets - 10 reps - 5 sec  hold - Supine Hip Abduction  - 2 x daily - 7 x weekly - 1 sets - 10 reps - 3 sec  hold - Supine Heel Slide with Strap  - 2 x daily - 7 x weekly - 1 sets - 5-10 reps - 10 sec  hold - Seated Heel Slide  - 2 x daily - 7 x weekly - 1 sets - 5-10 reps - 10 sec  hold - Hip and Knee Flexion with Anchored Resistance  - 1 x daily - 7 x weekly - 3 sets - 10 reps  ASSESSMENT:  CLINICAL IMPRESSION: Progressed  hip flexor strength with resisted unilateral marching. Overpressure provided during active knee extension to continue knee AROM; patient exhibited towards end of repetitions, greater weakness demonstrated on R as compared to L side.  OBJECTIVE IMPAIRMENTS Abnormal gait, decreased activity tolerance, decreased balance, decreased mobility, difficulty walking, decreased ROM, decreased strength, increased edema, impaired flexibility, postural dysfunction, and pain.   GOALS: SHORT TERM GOALS: Target date: 10/20/2022  Independent in initial HEP  Baseline: Goal status: accomplished  2.  Increase AAROM Lt knee to (-)8 to (-)10 ext and 90 to 95 deg flexion  Baseline:  Goal status: partially accomplished  3.  Independent in gait with rolling walker improved gait pattern and increased wt bearing Lt LE  Baseline:  Goal status: accomplished  LONG TERM GOALS: Target date: 1/ 15/2024   Increased AROM Lt knee to 0 deg extension and 100 degrees flexion Goal status: on going  2.  Increase strength Lt hip and knee to 4+/5 to 5/5  Goal status: accomplished  3.  Gait with no assistive device and good gait pattern for community distance of at least 600-800 ft Goal status: accomplished  4.  Independent in all transfers in home  Goal status: oaccomplished   5.  Independent in ascending and descending 14 steps in home Goal status: accomplished  6.  Independent in HEP  Goal  status: on going   7. Improve functional limitation score to 45 11/10/22: 45  Goal status: accomplished    PLAN: PT FREQUENCY: 2x/week  PT DURATION: 8 weeks  PLANNED INTERVENTIONS: Therapeutic exercises, Therapeutic activity, Neuromuscular re-education, Balance training, Gait training, Patient/Family education, Self Care, Joint mobilization, Stair training, DME instructions, Aquatic Therapy, Dry Needling, Electrical stimulation, Cryotherapy, Moist heat, Taping, Vasopneumatic device, Ultrasound, Ionotophoresis '4mg'$ /ml  Dexamethasone, Manual therapy, and Re-evaluation  PLAN FOR NEXT SESSION:  continue knee rehab - working on ROM and strengthening; focus on gaining Lt knee. Continued trial of DN Lt hamstrings and calf as indicated  Hardin Negus, PTA 12/13/2022 11:04 AM

## 2022-12-15 ENCOUNTER — Encounter: Payer: Self-pay | Admitting: Rehabilitative and Restorative Service Providers"

## 2022-12-15 ENCOUNTER — Ambulatory Visit: Payer: Medicare Other | Admitting: Rehabilitative and Restorative Service Providers"

## 2022-12-15 DIAGNOSIS — G8929 Other chronic pain: Secondary | ICD-10-CM | POA: Diagnosis not present

## 2022-12-15 DIAGNOSIS — R29898 Other symptoms and signs involving the musculoskeletal system: Secondary | ICD-10-CM | POA: Diagnosis not present

## 2022-12-15 DIAGNOSIS — M6281 Muscle weakness (generalized): Secondary | ICD-10-CM

## 2022-12-15 DIAGNOSIS — M25662 Stiffness of left knee, not elsewhere classified: Secondary | ICD-10-CM

## 2022-12-15 DIAGNOSIS — R269 Unspecified abnormalities of gait and mobility: Secondary | ICD-10-CM

## 2022-12-15 DIAGNOSIS — M25562 Pain in left knee: Secondary | ICD-10-CM | POA: Diagnosis not present

## 2022-12-15 NOTE — Therapy (Signed)
OUTPATIENT PHYSICAL THERAPY LOWER EXTREMITY TREATMENT       Patient Name: Erik Estrada MRN: [id number] DOB:1951/09/06, 72 y.o., male Today's Date: 12/15/2022   PT End of Session - 12/15/22 1319     Visit Number 23    Number of Visits 28    Date for PT Re-Evaluation 12/26/22    Authorization Type Medicare    Authorization - Visit Number 23    Progress Note Due on Visit 54    PT Start Time 1315    PT Stop Time 1400    PT Time Calculation (min) 45 min    Activity Tolerance Patient tolerated treatment well                Past Medical History:  Diagnosis Date   Anxiety    Arthritis    Essential hypertension, benign 06/29/2011   Hemorrhoids 08/01/2018   History of kidney stones    Morbid obesity ([place]) 10/12/2018   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  Plan for 5-year recheck.   Vitamin D deficiency 08/01/2017   Past Surgical History:  Procedure Laterality Date   KIDNEY STONE SURGERY     SHOULDER SURGERY  December 2010   Left   TOTAL HIP ARTHROPLASTY Right 04/19/2022   Procedure: RIGHT TOTAL HIP ARTHROPLASTY ANTERIOR APPROACH;  Surgeon: Melrose Nakayama, MD;  Location: [facility];  Service: Orthopedics;  Laterality: Right;   TOTAL KNEE ARTHROPLASTY Left 09/20/2022   Procedure: LEFT TOTAL KNEE ARTHROPLASTY;  Surgeon: Melrose Nakayama, MD;  Location: [facility];  Service: Orthopedics;  Laterality: Left;   UMBILICAL HERNIA REPAIR     Patient Active Problem List   Diagnosis Date Noted   COVID-19 08/09/2022   Back pain 06/23/2022   Urinary retention 06/23/2022   Allergic sinusitis 04/12/2022   Rotator cuff tendinitis, right 02/03/2022   Primary osteoarthritis of right hip 06/24/2021   Primary osteoarthritis of left knee 03/29/2021   Hamstring strain, left, initial encounter 02/26/2021   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 01/29/2021   OA (osteoarthritis) of knee 08/16/2020   Neoplasm of uncertain behavior of skin 03/12/2020   Tubular adenoma of colon 10/17/2018   Hyperlipidemia 10/12/2018   Greater trochanteric bursitis of both hips 10/12/2018   Morbid obesity ([place]) 10/12/2018   Vitamin D deficiency 08/01/2017   Neurotic excoriations 07/27/2016   Family history of colon cancer requiring screening colonoscopy 05/16/2012   Prediabetes 05/16/2012   Tobacco abuse 07/20/2011   Hypogonadism male 07/20/2011   Essential hypertension, benign 06/29/2011    PCP: Dr Luetta Nutting  REFERRING PROVIDER: Dr Melrose Nakayama REFERRING DIAG: Lt TKA THERAPY DIAG:  Chronic pain of left knee  Abnormal gait  Muscle weakness (generalized)  Stiffness of left knee, not elsewhere classified  Other symptoms and signs involving the musculoskeletal system  Rationale for Evaluation and Treatment Rehabilitation  ONSET DATE: 09/20/22  SUBJECTIVE:   SUBJECTIVE STATEMENT: Patient reports that he has been working at the gym on exercises and walking. He is working on exercises in the water. Patient states he had some pain inside and outside of the "knee joint" while sleeping last night. Marland Kitchen  PERTINENT HISTORY: Lt TKA 09/20/22 Rt THA 5/23; HTN; arthritis  PAIN:  Are you having pain? Yes:  NPRS scale: 2/10 Pain location: Lt knee  Pain description: sharp; aching; tight; throbbing  Aggravating factors: walking;  movement Relieving factors: meds and elevation   PRECAUTIONS: post L TKA 09/20/22   PATIENT GOALS use the [name] again and get around   without walker or cane   OBJECTIVE:    LOWER EXTREMITY STRENGTH:   5/5 hip and knee   LOWER EXTREMITY ROM:  Active Assistive ROM Right eval Left eval Left 10/23 Left 10/19/22 Left 11/02/22 Left  11/28/22 Left 12/15/22  Hip flexion 120 110       Hip extension 5 0       Hip abduction 26 32       Hip adduction         Hip internal rotation          Hip external rotation         Knee flexion 110 63 90 in sitting  105 supine  114 supine  117 supine    Knee extension 0 -14 -25 extensor lag -9 Supine heel propped on bolster -11 Supine heel propped on bolster  -8 Supine heel propped on bolster -7 Supine propped on bolster  Ankle dorsiflexion         Ankle plantarflexion         Ankle inversion         Ankle eversion          (Blank rows = not tested)   TODAY'S TREATMENT: Therapeutic exercise: 12/15/22:    Elliptical x 5 min    Nustep L8 UEs/LEs x 6 min   Leg press 115# 2x10 DL, 115# 10 SL Lt      Supine:   Hamstring stretch with strap 2x30 sec      Prone:   Glut set 10 sec x 10    Knee extension to hip ext 5 sec x 10     Sitting:   Sit to stand x 10 x 2      Standing:   Working on hip extension/knee extension   Manual therapy: Knee ext PROM Grade II to III mob for knee ext  Gait:  Forwards amb x3 laps around gym focusing on weight shifting to L, heel strike, knee ext  Backwards walking x 20 feet x 3 reps  Trigger Point Dry-Needling  Patient Consent Given: Yes Education handout provided: Previously provided Muscles treated: Lt distal quad; peripatellar region Electrical stimulation performed: no Parameters: n/a Treatment response/outcome: decreased palpable tightness; increased knee extension     [facility] Adult PT Treatment:                                                 DATE: 12/13/2022. Therapeutic Exercise: NuStep L8 (UE8/LE) x 30mn Supine: SAQ (foam roller) 10x3" Foot on foam roller: quad set with MWM for knee ext 10x3" AAROM heel slides w/.strap 10x3" L SLR 2x10 Standing: Gastroc stretch (heels off low step, HHAx2) 3x30" Heel raises (heels off low step, HHAx2) 2x10 Ankle cuff resistance step forward x12 with TKE 5# at cable Ankle cuff resistance step backward x12 with TKE 5# at cable Resisted hip flexion RTB x10 B Supine: Hip flexor stretch x30" HS stretch w/strap x30" B  PATIENT EDUCATION:   Education details: rationale for interventions, POC Person educated: Patient Education method: Explanation, Demonstration, Tactile cues, Verbal cues, handouts Education comprehension: verbalized understanding, returned demonstration, verbal cues required, tactile cues required, and needs further education  HOME EXERCISE PROGRAM: Access Code:  [email or web address] URL: [email or web address] Date: 12/13/2022 Prepared by: Helane Gunther  Exercises - Ankle Pumps in Elevation  - 2 x daily - 7 x weekly - 1 sets - 10-20 reps - Ankle Circles in Elevation  - 2 x daily - 7 x weekly - 1 sets - 10-15 reps - Supine Quad Set  - 2 x daily - 7 x weekly - 1 sets - 10 reps - 3 sec  hold - Small Range Straight Leg Raise  - 2 x daily - 7 x weekly - 1 sets - 10 reps - 5 sec  hold - Supine Hip Abduction  - 2 x daily - 7 x weekly - 1 sets - 10 reps - 3 sec  hold - Supine Heel Slide with Strap  - 2 x daily - 7 x weekly - 1 sets - 5-10 reps - 10 sec  hold - Seated Heel Slide  - 2 x daily - 7 x weekly - 1 sets - 5-10 reps - 10 sec  hold - Hip and Knee Flexion with Anchored Resistance  - 1 x daily - 7 x weekly - 3 sets - 10 reps  ASSESSMENT:  CLINICAL IMPRESSION: Continued with strengthening and stretching. Overpressure provided during knee extension to continue knee AROM. Approaching d/c to independent home program.    OBJECTIVE IMPAIRMENTS Abnormal gait, decreased activity tolerance, decreased balance, decreased mobility, difficulty walking, decreased ROM, decreased strength, increased edema, impaired flexibility, postural dysfunction, and pain.   GOALS: SHORT TERM GOALS: Target date: 10/20/2022  Independent in initial HEP  Baseline: Goal status: accomplished  2.  Increase AAROM Lt knee to (-)8 to (-)10 ext and 90 to 95 deg flexion  Baseline:  Goal status: partially accomplished  3.  Independent in gait with rolling walker improved gait pattern and increased wt bearing Lt LE  Baseline:  Goal status:  accomplished  LONG TERM GOALS: Target date: 1/ 15/2024   Increased AROM Lt knee to 0 deg extension and 100 degrees flexion Goal status: on going  2.  Increase strength Lt hip and knee to 4+/5 to 5/5  Goal status: accomplished  3.  Gait with no assistive device and good gait pattern for community distance of at least 600-800 ft Goal status: accomplished  4.  Independent in all transfers in home  Goal status: oaccomplished   5.  Independent in ascending and descending 14 steps in home Goal status: accomplished  6.  Independent in HEP  Goal status: on going   7. Improve functional limitation score to 45 11/10/22: 45  Goal status: accomplished    PLAN: PT FREQUENCY: 2x/week  PT DURATION: 8 weeks  PLANNED INTERVENTIONS: Therapeutic exercises, Therapeutic activity, Neuromuscular re-education, Balance training, Gait training, Patient/Family education, Self Care, Joint mobilization, Stair training, DME instructions, Aquatic Therapy, Dry Needling, Electrical stimulation, Cryotherapy, Moist heat, Taping, Vasopneumatic device, Ultrasound, Ionotophoresis '4mg'$ /ml Dexamethasone, Manual therapy, and Re-evaluation  PLAN FOR NEXT SESSION:  continue knee rehab - working on ROM and strengthening; focus on gaining Lt knee. Continued trial of DN Lt hamstrings and calf as indicated  Kristianne Albin P Oneda Duffett, PT 12/15/2022 1:25 PM

## 2022-12-19 ENCOUNTER — Ambulatory Visit: Payer: Medicare Other | Admitting: Rehabilitative and Restorative Service Providers"

## 2022-12-19 ENCOUNTER — Encounter: Payer: Self-pay | Admitting: Rehabilitative and Restorative Service Providers"

## 2022-12-19 DIAGNOSIS — M25662 Stiffness of left knee, not elsewhere classified: Secondary | ICD-10-CM | POA: Diagnosis not present

## 2022-12-19 DIAGNOSIS — M25562 Pain in left knee: Secondary | ICD-10-CM | POA: Diagnosis not present

## 2022-12-19 DIAGNOSIS — M6281 Muscle weakness (generalized): Secondary | ICD-10-CM

## 2022-12-19 DIAGNOSIS — R269 Unspecified abnormalities of gait and mobility: Secondary | ICD-10-CM | POA: Diagnosis not present

## 2022-12-19 DIAGNOSIS — G8929 Other chronic pain: Secondary | ICD-10-CM | POA: Diagnosis not present

## 2022-12-19 DIAGNOSIS — R29898 Other symptoms and signs involving the musculoskeletal system: Secondary | ICD-10-CM

## 2022-12-19 DIAGNOSIS — R600 Localized edema: Secondary | ICD-10-CM

## 2022-12-19 NOTE — Therapy (Signed)
OUTPATIENT PHYSICAL THERAPY LOWER EXTREMITY TREATMENT       Patient Name: Erik Estrada MRN: [id number] DOB:Aug 25, 1951, 72 y.o., male Today's Date: 12/19/2022   PT End of Session - 12/19/22 1150     Visit Number 24    Number of Visits 28    Date for PT Re-Evaluation 12/26/22    Authorization Type Medicare    Authorization - Visit Number 24    Progress Note Due on Visit 31    PT Start Time 6712    PT Stop Time 1230    PT Time Calculation (min) 45 min    Activity Tolerance Patient tolerated treatment well                Past Medical History:  Diagnosis Date   Anxiety    Arthritis    Essential hypertension, benign 06/29/2011   Hemorrhoids 08/01/2018   History of kidney stones    Morbid obesity ([place]) 10/12/2018   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  Plan for 5-year recheck.   Vitamin D deficiency 08/01/2017   Past Surgical History:  Procedure Laterality Date   KIDNEY STONE SURGERY     SHOULDER SURGERY  December 2010   Left   TOTAL HIP ARTHROPLASTY Right 04/19/2022   Procedure: RIGHT TOTAL HIP ARTHROPLASTY ANTERIOR APPROACH;  Surgeon: Melrose Nakayama, MD;  Location: [facility];  Service: Orthopedics;  Laterality: Right;   TOTAL KNEE ARTHROPLASTY Left 09/20/2022   Procedure: LEFT TOTAL KNEE ARTHROPLASTY;  Surgeon: Melrose Nakayama, MD;  Location: [facility];  Service: Orthopedics;  Laterality: Left;   UMBILICAL HERNIA REPAIR     Patient Active Problem List   Diagnosis Date Noted   COVID-19 08/09/2022   Back pain 06/23/2022   Urinary retention 06/23/2022   Allergic sinusitis 04/12/2022   Rotator cuff tendinitis, right 02/03/2022   Primary osteoarthritis of right hip 06/24/2021   Primary osteoarthritis of left knee 03/29/2021   Hamstring strain, left, initial encounter 02/26/2021   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 01/29/2021   OA (osteoarthritis) of knee 08/16/2020   Neoplasm of uncertain behavior of skin 03/12/2020   Tubular adenoma of colon 10/17/2018   Hyperlipidemia 10/12/2018   Greater trochanteric bursitis of both hips 10/12/2018   Morbid obesity ([place]) 10/12/2018   Vitamin D deficiency 08/01/2017   Neurotic excoriations 07/27/2016   Family history of colon cancer requiring screening colonoscopy 05/16/2012   Prediabetes 05/16/2012   Tobacco abuse 07/20/2011   Hypogonadism male 07/20/2011   Essential hypertension, benign 06/29/2011    PCP: Dr Luetta Nutting  REFERRING PROVIDER: Dr Melrose Nakayama REFERRING DIAG: Lt TKA THERAPY DIAG:  Chronic pain of left knee  Abnormal gait  Muscle weakness (generalized)  Stiffness of left knee, not elsewhere classified  Other symptoms and signs involving the musculoskeletal system  Localized edema  Rationale for Evaluation and Treatment Rehabilitation  ONSET DATE: 09/20/22  SUBJECTIVE:   SUBJECTIVE STATEMENT: Patient continues working at the gym on exercises and walking. He is working on exercises in the water. Patient states he had some pain across the top of the knee at the area of the incision.   PERTINENT HISTORY: Lt TKA 09/20/22 Rt THA 5/23; HTN; arthritis  PAIN:  Are you having pain? No:  NPRS scale: 0/10 Pain location: Lt knee  Pain description: sharp; aching; tight; throbbing  Aggravating factors: walking;  movement Relieving factors: meds and elevation   PRECAUTIONS: post L TKA 09/20/22   PATIENT GOALS use the [name] again and get around   without walker or cane   OBJECTIVE:    LOWER EXTREMITY STRENGTH:   5/5 hip and knee   LOWER EXTREMITY ROM:  Active Assistive ROM Right eval Left eval Left 10/23 Left 10/19/22 Left 11/02/22 Left  11/28/22 Left 12/15/22  Hip flexion 120 110       Hip extension 5 0       Hip abduction 26 32       Hip adduction         Hip internal rotation         Hip  external rotation         Knee flexion 110 63 90 in sitting  105 supine  114 supine  117 supine    Knee extension 0 -14 -25 extensor lag -9 Supine heel propped on bolster -11 Supine heel propped on bolster  -8 Supine heel propped on bolster -7 Supine propped on bolster  Ankle dorsiflexion         Ankle plantarflexion         Ankle inversion         Ankle eversion          (Blank rows = not tested)   TODAY'S TREATMENT: Therapeutic exercise: 12/19/22:    Elliptical x 5 min    Nustep L8 UEs/LEs x 6 min   Leg press 120# 2x10 DL, 115# 10 SL Lt      Supine:   Hamstring stretch with strap 2x30 sec    Sitting:   Sit to stand x 10 x 2      Standing:   Working on hip extension/knee extension   Manual therapy: Knee ext PROM Grade II to III mob for knee ext  Gait:  Forwards amb x3 laps around gym focusing on weight shifting to L, heel strike, knee ext  Backwards walking x 20 feet x 3 reps  Trigger Point Dry-Needling  Patient Consent Given: Yes Education handout provided: Previously provided Muscles treated: Scar threading length of incision; Lt distal quad; peripatellar region Electrical stimulation performed: yes Parameters: mAmp to pt tolerance Treatment response/outcome: decreased palpable tightness; increased knee extension    Therapeutic exercise: 12/15/22:    Elliptical x 5 min    Nustep L8 UEs/LEs x 6 min   Leg press 115# 2x10 DL, 115# 10 SL Lt      Supine:   Hamstring stretch with strap 2x30 sec      Prone:   Glut set 10 sec x 10    Knee extension to hip ext 5 sec x 10     Sitting:   Sit to stand x 10 x 2      Standing:   Working on hip extension/knee extension   Manual therapy: Knee ext PROM Grade II to III mob for knee ext  Gait:  Forwards amb x3 laps around gym focusing on weight shifting to L, heel strike, knee ext  Backwards walking x 20 feet x 3 reps  Trigger Point Dry-Needling  Patient Consent Given: Yes Education handout provided: Previously  provided Muscles treated: Lt distal quad; peripatellar region Electrical stimulation performed: no Parameters: n/a Treatment response/outcome: decreased palpable tightness; increased knee extension     PATIENT EDUCATION:  Education details: rationale for interventions, POC Person educated: Patient Education method: Explanation, Demonstration, Tactile cues, Verbal cues, handouts Education comprehension: verbalized understanding, returned  demonstration, verbal cues required, tactile cues required, and needs further education  HOME EXERCISE PROGRAM: Access Code: [email or web address] URL: [email or web address] Date: 12/13/2022 Prepared by: Helane Gunther  Exercises - Ankle Pumps in Elevation  - 2 x daily - 7 x weekly - 1 sets - 10-20 reps - Ankle Circles in Elevation  - 2 x daily - 7 x weekly - 1 sets - 10-15 reps - Supine Quad Set  - 2 x daily - 7 x weekly - 1 sets - 10 reps - 3 sec  hold - Small Range Straight Leg Raise  - 2 x daily - 7 x weekly - 1 sets - 10 reps - 5 sec  hold - Supine Hip Abduction  - 2 x daily - 7 x weekly - 1 sets - 10 reps - 3 sec  hold - Supine Heel Slide with Strap  - 2 x daily - 7 x weekly - 1 sets - 5-10 reps - 10 sec  hold - Seated Heel Slide  - 2 x daily - 7 x weekly - 1 sets - 5-10 reps - 10 sec  hold - Hip and Knee Flexion with Anchored Resistance  - 1 x daily - 7 x weekly - 3 sets - 10 reps  ASSESSMENT:  CLINICAL IMPRESSION: Continued with strengthening and stretching. Overpressure provided during knee extension to continue knee AROM. Anticipate d/c to independent home program after next visit.    OBJECTIVE IMPAIRMENTS Abnormal gait, decreased activity tolerance, decreased balance, decreased mobility, difficulty walking, decreased ROM, decreased strength, increased edema, impaired flexibility, postural dysfunction, and pain.   GOALS: SHORT TERM GOALS: Target date: 10/20/2022  Independent in initial HEP  Baseline: Goal status: accomplished  2.   Increase AAROM Lt knee to (-)8 to (-)10 ext and 90 to 95 deg flexion  Baseline:  Goal status: partially accomplished  3.  Independent in gait with rolling walker improved gait pattern and increased wt bearing Lt LE  Baseline:  Goal status: accomplished  LONG TERM GOALS: Target date: 1/ 15/2024   Increased AROM Lt knee to 0 deg extension and 100 degrees flexion Goal status: on going  2.  Increase strength Lt hip and knee to 4+/5 to 5/5  Goal status: accomplished  3.  Gait with no assistive device and good gait pattern for community distance of at least 600-800 ft Goal status: accomplished  4.  Independent in all transfers in home  Goal status: oaccomplished   5.  Independent in ascending and descending 14 steps in home Goal status: accomplished  6.  Independent in HEP  Goal status: on going   7. Improve functional limitation score to 45 11/10/22: 45  Goal status: accomplished    PLAN: PT FREQUENCY: 2x/week  PT DURATION: 8 weeks  PLANNED INTERVENTIONS: Therapeutic exercises, Therapeutic activity, Neuromuscular re-education, Balance training, Gait training, Patient/Family education, Self Care, Joint mobilization, Stair training, DME instructions, Aquatic Therapy, Dry Needling, Electrical stimulation, Cryotherapy, Moist heat, Taping, Vasopneumatic device, Ultrasound, Ionotophoresis '4mg'$ /ml Dexamethasone, Manual therapy, and Re-evaluation  PLAN FOR NEXT SESSION:  continue knee rehab - working on ROM and strengthening; focus on gaining Lt knee. Continued trial of DN Lt hamstrings and calf as indicated  Sussie Minor Nilda Simmer, PT 12/19/2022 11:52 AM

## 2022-12-22 ENCOUNTER — Ambulatory Visit: Payer: Medicare Other

## 2022-12-22 DIAGNOSIS — M25562 Pain in left knee: Secondary | ICD-10-CM | POA: Diagnosis not present

## 2022-12-22 DIAGNOSIS — G8929 Other chronic pain: Secondary | ICD-10-CM | POA: Diagnosis not present

## 2022-12-22 DIAGNOSIS — R269 Unspecified abnormalities of gait and mobility: Secondary | ICD-10-CM | POA: Diagnosis not present

## 2022-12-22 DIAGNOSIS — M6281 Muscle weakness (generalized): Secondary | ICD-10-CM | POA: Diagnosis not present

## 2022-12-22 DIAGNOSIS — M25662 Stiffness of left knee, not elsewhere classified: Secondary | ICD-10-CM | POA: Diagnosis not present

## 2022-12-22 DIAGNOSIS — R29898 Other symptoms and signs involving the musculoskeletal system: Secondary | ICD-10-CM | POA: Diagnosis not present

## 2022-12-22 NOTE — Therapy (Addendum)
OUTPATIENT PHYSICAL THERAPY LOWER EXTREMITY TREATMENT AND DISCHARGE SUMMARY    PHYSICAL THERAPY DISCHARGE SUMMARY  Visits from Start of Care: 25  Current functional level related to goals / functional outcomes: See progress note for discharge status    Remaining deficits: Unknown - needs to continue with HEP    Education / Equipment: HEP    Patient agrees to discharge. Patient goals were met. Patient is being discharged due to meeting the stated rehab goals.  Celyn P. Helene Kelp PT, MPH 02/14/23 4:00 PM    Patient Name: Erik Estrada MRN: [id number] DOB:24-Jul-1951, 72 y.o., male Today's Date: 12/22/2022   PT End of Session - 12/22/22 1054     Visit Number 25    Number of Visits 28    Date for PT Re-Evaluation 12/26/22    PT Start Time [id number]    PT Stop Time [id number]    PT Time Calculation (min) 47 min    Activity Tolerance Patient tolerated treatment well    Behavior During Therapy [facility] for tasks assessed/performed                Past Medical History:  Diagnosis Date   Anxiety    Arthritis    Essential hypertension, benign 06/29/2011   Hemorrhoids 08/01/2018   History of kidney stones    Morbid obesity ([place]) 10/12/2018   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  Plan for 5-year recheck.   Vitamin D deficiency 08/01/2017   Past Surgical History:  Procedure Laterality Date   KIDNEY STONE SURGERY     SHOULDER SURGERY  December 2010   Left   TOTAL HIP ARTHROPLASTY Right 04/19/2022   Procedure: RIGHT TOTAL HIP ARTHROPLASTY ANTERIOR APPROACH;  Surgeon: Melrose Nakayama, MD;  Location: [facility];  Service: Orthopedics;  Laterality: Right;   TOTAL KNEE ARTHROPLASTY Left 09/20/2022   Procedure: LEFT TOTAL KNEE ARTHROPLASTY;  Surgeon: Melrose Nakayama, MD;  Location: [facility];  Service: Orthopedics;  Laterality: Left;   UMBILICAL HERNIA REPAIR      Patient Active Problem List   Diagnosis Date Noted   COVID-19 08/09/2022   Back pain 06/23/2022   Urinary retention 06/23/2022   Allergic sinusitis 04/12/2022   Rotator cuff tendinitis, right 02/03/2022   Primary osteoarthritis of right hip 06/24/2021   Primary osteoarthritis of left knee 03/29/2021   Hamstring strain, left, initial encounter 02/26/2021   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 01/29/2021   OA (osteoarthritis) of knee 08/16/2020   Neoplasm of uncertain behavior of skin 03/12/2020   Tubular adenoma of colon 10/17/2018   Hyperlipidemia 10/12/2018   Greater trochanteric bursitis of both hips 10/12/2018   Morbid obesity ([place]) 10/12/2018   Vitamin D deficiency 08/01/2017   Neurotic excoriations 07/27/2016   Family history of colon cancer requiring screening colonoscopy 05/16/2012   Prediabetes 05/16/2012   Tobacco abuse 07/20/2011   Hypogonadism male 07/20/2011   Essential hypertension, benign 06/29/2011    PCP: Dr Luetta Nutting  REFERRING PROVIDER: Dr Melrose Nakayama REFERRING DIAG: Lt TKA THERAPY DIAG:  Chronic pain of left knee  Abnormal gait  Muscle weakness (generalized)  Stiffness of left knee, not elsewhere classified  Rationale for Evaluation and Treatment Rehabilitation  ONSET DATE: 09/20/22  SUBJECTIVE:   SUBJECTIVE STATEMENT: Patient reports he felt better after DN from previous session, states he has very minimal pain in L knee today.  Patient states he has been having more trouble with R hip getting in/out of son's car. Patient states he is going to local gym and started adding in leg press machine.   PERTINENT HISTORY: Lt TKA 09/20/22 Rt THA 5/23; HTN; arthritis  PAIN:  Are you having pain? No:  NPRS scale: 0/10 Pain location: Lt knee  Pain description: sharp; aching; tight; throbbing  Aggravating factors: walking; movement Relieving factors: meds and elevation   PRECAUTIONS: post L TKA 09/20/22    PATIENT GOALS use the [name] again and get around without walker or cane   OBJECTIVE:    LOWER EXTREMITY STRENGTH:   5/5 hip and knee   LOWER EXTREMITY ROM:  Active Assistive ROM Right eval Left eval Left 10/23 Left 10/19/22 Left 11/02/22 Left  11/28/22 Left 12/15/22 Left  12/22/22  Hip flexion 120 110        Hip extension 5 0        Hip abduction 26 32        Hip adduction          Hip internal rotation          Hip external rotation          Knee flexion 110 63 90 in sitting  105 supine  114 supine  117 supine   120 supine  Knee extension 0 -14 -25 extensor lag -9 Supine heel propped on bolster -11 Supine heel propped on bolster  -8 Supine heel propped on bolster -7 Supine propped on bolster -6 Supine propped on bolster  Ankle dorsiflexion          Ankle plantarflexion          Ankle inversion          Ankle eversion           (Blank rows = not tested)   TODAY'S TREATMENT:  [facility] Adult PT Treatment:                                                DATE: 12/22/2022 Therapeutic Exercise: NuStep warm up L 8 x 10 min + subjective intake Leg press: DL 122.5#  2x12 --> L SL 95#  x10, x5  L knee AROM measurements Trigger Point Dry-Needling (performed by Gillermo Murdoch, PT) Patient Consent Given: Yes Education handout provided: Previously provided Muscles treated: Scar threading length of incision; Lt distal quad; peripatellar region Electrical stimulation performed: yes Parameters: mAmp to pt tolerance Treatment response/outcome: decreased palpable tightness; increased knee ROM      Therapeutic exercise: 12/19/22:    Elliptical x 5 min    Nustep L8 UEs/LEs x 6 min   Leg press 120# 2x10 DL, 115# 10 SL Lt      Supine:   Hamstring stretch with strap 2x30 sec    Sitting:   Sit to stand x 10 x 2      Standing:   Working on hip extension/knee extension   Manual therapy: Knee ext PROM Grade II to III mob for knee ext  Gait:  Forwards amb x3 laps around gym focusing  on weight shifting to L, heel strike, knee ext  Backwards walking x 20 feet x 3 reps  Trigger Point Dry-Needling  Patient Consent Given: Yes Education handout provided: Previously provided Muscles treated: Scar threading length of incision; Lt distal quad; peripatellar region Electrical  stimulation performed: yes Parameters: mAmp to pt tolerance Treatment response/outcome: decreased palpable tightness; increased knee extension      PATIENT EDUCATION:  Education details: passive TKE stretch with added weight  Person educated: Patient Education method: Explanation, Demonstration, Tactile cues, Verbal cues, handouts Education comprehension: verbalized understanding, returned demonstration, verbal cues required, tactile cues required, and needs further education  HOME EXERCISE PROGRAM: Access Code: [email or web address] URL: [email or web address] Date: 12/13/2022 Prepared by: Helane Gunther  Exercises - Ankle Pumps in Elevation  - 2 x daily - 7 x weekly - 1 sets - 10-20 reps - Ankle Circles in Elevation  - 2 x daily - 7 x weekly - 1 sets - 10-15 reps - Supine Quad Set  - 2 x daily - 7 x weekly - 1 sets - 10 reps - 3 sec  hold - Small Range Straight Leg Raise  - 2 x daily - 7 x weekly - 1 sets - 10 reps - 5 sec  hold - Supine Hip Abduction  - 2 x daily - 7 x weekly - 1 sets - 10 reps - 3 sec  hold - Supine Heel Slide with Strap  - 2 x daily - 7 x weekly - 1 sets - 5-10 reps - 10 sec  hold - Seated Heel Slide  - 2 x daily - 7 x weekly - 1 sets - 5-10 reps - 10 sec  hold - Hip and Knee Flexion with Anchored Resistance  - 1 x daily - 7 x weekly - 3 sets - 10 reps  ASSESSMENT:  CLINICAL IMPRESSION: Patient demonstrated improved L knee AROM measurements; most significant improvement exhibited with L total knee extension at -6 degrees. Mat placed underneath R foot on leg press to address slight leg length discrepancy and promote equal weight bearing in LE with double leg press. Mild quad  muscle fatigue noted during second set of L single leg press exercise. Patient continues to respond well to trigger pointe dry-needling treatment administered by PT Celyn Holt, demonstrating improved knee AROM mobility and range. Patient has met majority of goals as outlined in [place], exhibiting good progress with L knee AROM and strength. Will hold PT and patient will continue with independent HEP including water based exercises.   OBJECTIVE IMPAIRMENTS Abnormal gait, decreased activity tolerance, decreased balance, decreased mobility, difficulty walking, decreased ROM, decreased strength, increased edema, impaired flexibility, postural dysfunction, and pain.   GOALS: SHORT TERM GOALS: Target date: 10/20/2022  Independent in initial HEP  Baseline: Goal status: accomplished  2.  Increase AAROM Lt knee to (-)8 to (-)10 ext and 90 to 95 deg flexion  Baseline:  Goal status: partially accomplished  3.  Independent in gait with rolling walker improved gait pattern and increased wt bearing Lt LE  Baseline:  Goal status: accomplished  LONG TERM GOALS: Target date: 12/26/2022   Increased AROM Lt knee to 0 deg extension and 100 degrees flexion Goal status: on going 12/22/22: PARTIALLY MET (ext -6, flex 120)  2.  Increase strength Lt hip and knee to 4+/5 to 5/5  Goal status: accomplished  3.  Gait with no assistive device and good gait pattern for community distance of at least 600-800 ft Goal status: accomplished  4.  Independent in all transfers in home  Goal status: accomplished   5.  Independent in ascending and descending 14 steps in home Goal status: accomplished  6.  Independent in HEP  Goal status: on going  12/22/22: MET  7. Improve functional limitation score  to 45 11/10/22: 45  Goal status: accomplished    PLAN: PT FREQUENCY: 2x/week  PT DURATION: 8 weeks  PLANNED INTERVENTIONS: Therapeutic exercises, Therapeutic activity, Neuromuscular re-education, Balance training, Gait  training, Patient/Family education, Self Care, Joint mobilization, Stair training, DME instructions, Aquatic Therapy, Dry Needling, Electrical stimulation, Cryotherapy, Moist heat, Taping, Vasopneumatic device, Ultrasound, Ionotophoresis '4mg'$ /ml Dexamethasone, Manual therapy, and Re-evaluation  PLAN FOR NEXT SESSION:  Hold PT with patient to continue with independent HEP and aquatic program with wife. He will call with any questions or problems. D/C in 4 weeks if patient does not experience any problems with independent management of symptoms.   Hardin Negus, PTA 12/22/2022 11:54 AM  Celyn P. Helene Kelp PT, MPH 12/22/22 12:45 PM

## 2022-12-26 ENCOUNTER — Encounter: Payer: Self-pay | Admitting: Family Medicine

## 2022-12-26 ENCOUNTER — Ambulatory Visit (INDEPENDENT_AMBULATORY_CARE_PROVIDER_SITE_OTHER): Payer: Medicare Other | Admitting: Family Medicine

## 2022-12-26 VITALS — BP 123/74 | HR 87 | Ht 64.0 in | Wt 224.0 lb

## 2022-12-26 DIAGNOSIS — R7303 Prediabetes: Secondary | ICD-10-CM

## 2022-12-26 DIAGNOSIS — I10 Essential (primary) hypertension: Secondary | ICD-10-CM | POA: Diagnosis not present

## 2022-12-26 DIAGNOSIS — E559 Vitamin D deficiency, unspecified: Secondary | ICD-10-CM

## 2022-12-26 DIAGNOSIS — R5383 Other fatigue: Secondary | ICD-10-CM

## 2022-12-26 DIAGNOSIS — Z720 Tobacco use: Secondary | ICD-10-CM | POA: Diagnosis not present

## 2022-12-26 NOTE — Assessment & Plan Note (Signed)
Orders Placed This Encounter  Procedures   COMPLETE METABOLIC PANEL WITH GFR   CBC with Differential   Iron, TIBC and Ferritin Panel   TSH   Vitamin D (25 hydroxy)   HgB A1c

## 2022-12-26 NOTE — Patient Instructions (Addendum)
I would recommend lung cancer screening with your history of smoking.   Continue current blood pressure medication.   We'll be in touch with lab results.  Be cautious with YouTube and information found on the internet.  Often times this is not backed up by medical evidence.

## 2022-12-26 NOTE — Assessment & Plan Note (Signed)
Repeat A1c today.

## 2022-12-26 NOTE — Assessment & Plan Note (Signed)
He continues to smoke.  He does not feel like he is ready to quit at this time.  We did discuss lung cancer screening however he does not want this at this time.  Risk and benefits discussed.

## 2022-12-26 NOTE — Assessment & Plan Note (Signed)
Blood pressure is well-controlled with current dose of lisinopril.  Recommend continuation at current strength.  Updated labs ordered.

## 2022-12-26 NOTE — Progress Notes (Signed)
Erik Estrada - 72 y.o. male MRN [id number]  Date of birth: 02/09/1951  Subjective Chief Complaint  Patient presents with   Hypertension    HPI Erik Estrada is a 72 year old male here today for follow-up visit.  Reports Erik Estrada is doing pretty well.  Continues on lisinopril for management of hypertension.  Tolerating well without side effects.  Erik Estrada has not had chest pain, shortness of breath, palpitations, headaches or vision changes.  Erik Estrada is trying to exercise a little more frequently.  Erik Estrada does feel that Erik Estrada gets winded with exercise.  Does not have any chest pain or tightness associated with exercise.  Erik Estrada does not really get short of breath with things around the house.    Erik Estrada has noted symptom fatigue.  Erik Estrada is concerned about testosterone levels being low and contributing to this.  Reports Erik Estrada is sleeping well.  ROS:  A comprehensive ROS was completed and negative except as noted per HPI  Allergies  Allergen Reactions   Hydrochlorothiazide Other (See Comments)    Fatigue   Lipitor [Atorvastatin Calcium] Other (See Comments)    Myalgia    Past Medical History:  Diagnosis Date   Anxiety    Arthritis    Essential hypertension, benign 06/29/2011   Hemorrhoids 08/01/2018   History of kidney stones    Morbid obesity ([place]) 10/12/2018   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  Plan for 5-year recheck.   Vitamin D deficiency 08/01/2017    Past Surgical History:  Procedure Laterality Date   KIDNEY STONE SURGERY     SHOULDER SURGERY  December 2010   Left   TOTAL HIP ARTHROPLASTY Right 04/19/2022   Procedure: RIGHT TOTAL HIP ARTHROPLASTY ANTERIOR APPROACH;  Surgeon: Melrose Nakayama, MD;  Location: [facility];  Service: Orthopedics;  Laterality: Right;   TOTAL KNEE ARTHROPLASTY Left 09/20/2022   Procedure: LEFT TOTAL KNEE ARTHROPLASTY;  Surgeon: Melrose Nakayama, MD;   Location: [facility];  Service: Orthopedics;  Laterality: Left;   UMBILICAL HERNIA REPAIR      Social History   Socioeconomic History   Marital status: Married    Spouse name: Caren Griffins   Number of children: 2   Years of education: Not on file   Highest education level: Not on file  Occupational History   Occupation: Public librarian    Comment: [employer]  Tobacco Use   Smoking status: Every Day    Packs/day: 1.50    Years: 22.00    Total pack years: 33.00    Types: Cigarettes   Smokeless tobacco: Never  Vaping Use   Vaping Use: Never used  Substance and Sexual Activity   Alcohol use: No   Drug use: No   Sexual activity: Yes    Partners: Female  Other Topics Concern   Not on file  Social History Narrative   2 caffeinated drinks daily. No regular exercise Erik Estrada does have a physical job.   Social Determinants of Health   Financial Resource Strain: Not on file  Food Insecurity: Not on file  Transportation Needs: Not on file  Physical Activity: Not on file  Stress: Not on file  Social Connections: Not on file    Family History  Problem Relation Age of Onset   Colon cancer Brother    Hypertension Mother    Parkinson's disease Mother    Stroke Brother     Health Maintenance  Topic Date Due   Medicare Annual Wellness (AWV)  Never done   Zoster Vaccines- Shingrix (1 of 2) Never done   Lung Cancer Screening  Never done   COVID-19 Vaccine (3 - Pfizer risk series) 03/24/2020   INFLUENZA VACCINE  07/12/2022   Pneumonia Vaccine 88+ Years old (3 - PPSV23 or PCV20) 08/01/2022   DTaP/Tdap/Td (3 - Td or Tdap) 11/15/2022   COLONOSCOPY (Pts 45-79yr Insurance coverage will need to be confirmed)  03/11/2023 (Originally 11/29/2018)   Hepatitis C Screening  Completed   HPV VACCINES  Aged Out      ----------------------------------------------------------------------------------------------------------------------------------------------------------------------------------------------------------------- Physical Exam BP 123/74   Pulse 87   Ht '5\' 4"'$  (1.626 m)   Wt 224 lb (101.6 kg)   SpO2 100%   BMI 38.45 kg/m   Physical Exam Constitutional:      Appearance: Normal appearance.  HENT:     Head: Normocephalic and atraumatic.  Eyes:     General: No scleral icterus. Cardiovascular:     Rate and Rhythm: Normal rate and regular rhythm.  Pulmonary:     Effort: Pulmonary effort is normal.     Breath sounds: Normal breath sounds.  Neurological:     General: No focal deficit present.     Mental Status: Erik Estrada is alert.  Psychiatric:        Mood and Affect: Mood normal.        Behavior: Behavior normal.     ------------------------------------------------------------------------------------------------------------------------------------------------------------------------------------------------------------------- Assessment and Plan  Essential hypertension, benign Blood pressure is well-controlled with current dose of lisinopril.  Recommend continuation at current strength.  Updated labs ordered.  Tobacco abuse Erik Estrada continues to smoke.  Erik Estrada does not feel like Erik Estrada is ready to quit at this time.  We did discuss lung cancer screening however Erik Estrada does not want this at this time.  Risk and benefits discussed.  Prediabetes Repeat A1c today.  Other fatigue Orders Placed This Encounter  Procedures   COMPLETE METABOLIC PANEL WITH GFR   CBC with Differential   Iron, TIBC and Ferritin Panel   TSH   Vitamin D (25 hydroxy)   HgB A1c     No orders of the defined types were placed in this encounter.   Return in about 6 months (around 06/26/2023) for HTN.    This visit occurred during the SARS-CoV-2 public health emergency.  Safety protocols were in place, including screening  questions prior to the visit, additional usage of staff PPE, and extensive cleaning of exam room while observing appropriate contact time as indicated for disinfecting solutions.

## 2022-12-29 DIAGNOSIS — R7303 Prediabetes: Secondary | ICD-10-CM | POA: Diagnosis not present

## 2022-12-29 DIAGNOSIS — R7989 Other specified abnormal findings of blood chemistry: Secondary | ICD-10-CM | POA: Diagnosis not present

## 2022-12-29 DIAGNOSIS — E559 Vitamin D deficiency, unspecified: Secondary | ICD-10-CM | POA: Diagnosis not present

## 2022-12-29 DIAGNOSIS — I10 Essential (primary) hypertension: Secondary | ICD-10-CM | POA: Diagnosis not present

## 2022-12-29 DIAGNOSIS — R5383 Other fatigue: Secondary | ICD-10-CM | POA: Diagnosis not present

## 2022-12-30 DIAGNOSIS — Z96652 Presence of left artificial knee joint: Secondary | ICD-10-CM | POA: Diagnosis not present

## 2022-12-30 DIAGNOSIS — M1712 Unilateral primary osteoarthritis, left knee: Secondary | ICD-10-CM | POA: Diagnosis not present

## 2022-12-30 LAB — COMPLETE METABOLIC PANEL WITH GFR
AG Ratio: 1.5 (calc) (ref 1.0–2.5)
ALT: 20 U/L (ref 9–46)
AST: 18 U/L (ref 10–35)
Albumin: 4.5 g/dL (ref 3.6–5.1)
Alkaline phosphatase (APISO): 72 U/L (ref 35–144)
BUN: 10 mg/dL (ref 7–25)
CO2: 21 mmol/L (ref 20–32)
Calcium: 9.6 mg/dL (ref 8.6–10.3)
Chloride: 104 mmol/L (ref 98–110)
Creat: 0.89 mg/dL (ref 0.70–1.35)
Globulin: 3 g/dL (calc) (ref 1.9–3.7)
Glucose, Bld: 110 mg/dL — ABNORMAL HIGH (ref 65–99)
Potassium: 4.7 mmol/L (ref 3.5–5.3)
Sodium: 139 mmol/L (ref 135–146)
Total Bilirubin: 0.5 mg/dL (ref 0.2–1.2)
Total Protein: 7.5 g/dL (ref 6.1–8.1)
eGFR: 93 mL/min/{1.73_m2} (ref >=60)

## 2022-12-30 LAB — CBC WITH DIFFERENTIAL/PLATELET
Absolute Monocytes: 903 cells/uL (ref 200–950)
Basophils Absolute: 69 cells/uL (ref 0–200)
Basophils Relative: 0.8 %
Eosinophils Absolute: 198 cells/uL (ref 15–500)
Eosinophils Relative: 2.3 %
HCT: 47.5 % (ref 38.5–50.0)
Hemoglobin: 16.2 g/dL (ref 13.2–17.1)
Lymphs Abs: 1531 cells/uL (ref 850–3900)
MCH: 31.2 pg (ref 27.0–33.0)
MCHC: 34.1 g/dL (ref 32.0–36.0)
MCV: 91.5 fL (ref 80.0–100.0)
MPV: 10.9 fL (ref 7.5–12.5)
Monocytes Relative: 10.5 %
Neutro Abs: 5900 cells/uL (ref 1500–7800)
Neutrophils Relative %: 68.6 %
Platelets: 248 10*3/uL (ref 140–400)
RBC: 5.19 10*6/uL (ref 4.20–5.80)
RDW: 12.8 % (ref 11.0–15.0)
Total Lymphocyte: 17.8 %
WBC: 8.6 10*3/uL (ref 3.8–10.8)

## 2022-12-30 LAB — TSH: TSH: 2.12 mIU/L (ref 0.40–4.50)

## 2022-12-30 LAB — IRON,TIBC AND FERRITIN PANEL
%SAT: 26 % (calc) (ref 20–48)
Ferritin: 104 ng/mL (ref 24–380)
Iron: 88 ug/dL (ref 50–180)
TIBC: 341 mcg/dL (calc) (ref 250–425)

## 2022-12-30 LAB — HEMOGLOBIN A1C
Hgb A1c MFr Bld: 6.3 % of total Hgb — ABNORMAL HIGH (ref <5.7)
Mean Plasma Glucose: 134 mg/dL
eAG (mmol/L): 7.4 mmol/L

## 2022-12-30 LAB — VITAMIN D 25 HYDROXY (VIT D DEFICIENCY, FRACTURES): Vit D, 25-Hydroxy: 48 ng/mL (ref 30–100)

## 2023-01-24 ENCOUNTER — Other Ambulatory Visit: Payer: Self-pay | Admitting: Family Medicine

## 2023-02-13 ENCOUNTER — Ambulatory Visit (INDEPENDENT_AMBULATORY_CARE_PROVIDER_SITE_OTHER): Payer: Medicare Other | Admitting: Physician Assistant

## 2023-02-13 ENCOUNTER — Encounter: Payer: Self-pay | Admitting: Physician Assistant

## 2023-02-13 VITALS — BP 121/60 | HR 76 | Temp 98.6°F | Ht 64.0 in | Wt 218.0 lb

## 2023-02-13 DIAGNOSIS — R0602 Shortness of breath: Secondary | ICD-10-CM

## 2023-02-13 DIAGNOSIS — J40 Bronchitis, not specified as acute or chronic: Secondary | ICD-10-CM | POA: Diagnosis not present

## 2023-02-13 DIAGNOSIS — J329 Chronic sinusitis, unspecified: Secondary | ICD-10-CM | POA: Diagnosis not present

## 2023-02-13 DIAGNOSIS — R0789 Other chest pain: Secondary | ICD-10-CM

## 2023-02-13 MED ORDER — PREDNISONE 20 MG PO TABS: ORAL_TABLET | ORAL | 0 refills | Status: DC

## 2023-02-13 MED ORDER — AZITHROMYCIN 250 MG PO TABS: ORAL_TABLET | ORAL | 0 refills | Status: DC

## 2023-02-13 MED ORDER — IPRATROPIUM-ALBUTEROL 0.5-2.5 (3) MG/3ML IN SOLN
3.0000 mL | Freq: Once | RESPIRATORY_TRACT | Status: AC
  Administered 2023-02-13: 3 mL via RESPIRATORY_TRACT

## 2023-02-13 MED ORDER — ALBUTEROL SULFATE HFA 108 (90 BASE) MCG/ACT IN AERS: 2.0000 | INHALATION_SPRAY | Freq: Four times a day (QID) | RESPIRATORY_TRACT | 0 refills | Status: DC | PRN

## 2023-02-13 NOTE — Progress Notes (Signed)
Acute Office Visit  Subjective:     Patient ID: Erik Estrada, male    DOB: 16-Mar-1951, 72 y.o.   MRN: [id number]  Chief Complaint  Patient presents with   Cough    HPI Patient is in today for cough, chest tightness, and shortness of breath for 7 days. His wife has also been sick but not as bad. He is a smoker. Tested negative for covid a few days ago. He feels like "he cannot get anything up". He does not have inhalers. He has tried [employer] OTC products with some relief. He had some fever, chills, body aches at the beginning but have resolved not. His cough is productive and can hear "crackles" in chest.   .. Active Ambulatory Problems    Diagnosis Date Noted   Essential hypertension, benign 06/29/2011   Tobacco abuse 07/20/2011   Hypogonadism male 07/20/2011   Family history of colon cancer requiring screening colonoscopy 05/16/2012   Neurotic excoriations 07/27/2016   Prediabetes 05/16/2012   Vitamin D deficiency 08/01/2017   Hyperlipidemia 10/12/2018   Greater trochanteric bursitis of both hips 10/12/2018   Morbid obesity ([place]) 10/12/2018   Tubular adenoma of colon 10/17/2018   Neoplasm of uncertain behavior of skin 03/12/2020   OA (osteoarthritis) of knee 08/16/2020   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 01/29/2021   Hamstring strain, left, initial encounter 02/26/2021   Primary osteoarthritis of left knee 03/29/2021   Primary osteoarthritis of right hip 06/24/2021   Rotator cuff tendinitis, right 02/03/2022   Allergic sinusitis 04/12/2022   Back pain 06/23/2022   Urinary retention 06/23/2022   COVID-19 08/09/2022   Other fatigue 12/26/2022   Resolved Ambulatory Problems    Diagnosis Date Noted   Other and unspecified hyperlipidemia 07/20/2011   Leukocytosis 07/20/2011   Hemorrhoids 08/01/2018   Testosterone deficiency 10/12/2018   Past Medical History:  Diagnosis Date   Anxiety    Arthritis    History of kidney  stones    Pre-diabetes       ROS  See HPI.     Objective:    BP 121/60   Pulse 76   Temp 98.6 F (37 C) (Oral)   Ht '5\' 4"'$  (1.626 m)   Wt 218 lb (98.9 kg)   SpO2 98%   BMI 37.42 kg/m  BP Readings from Last 3 Encounters:  02/13/23 121/60  12/26/22 123/74  09/20/22 (!) 152/79   Wt Readings from Last 3 Encounters:  02/13/23 218 lb (98.9 kg)  12/26/22 224 lb (101.6 kg)  09/20/22 210 lb (95.3 kg)    Duoneb given today in office with improvement in SOB.   Physical Exam Constitutional:      Appearance: Normal appearance. He is obese.  HENT:     Head: Normocephalic.     Right Ear: Tympanic membrane, ear canal and external ear normal. There is no impacted cerumen.     Left Ear: Tympanic membrane, ear canal and external ear normal. There is no impacted cerumen.     Nose: Congestion present.  Eyes:     Extraocular Movements: Extraocular movements intact.     Conjunctiva/sclera: Conjunctivae normal.     Pupils: Pupils are equal, round, and reactive to light.  Neck:     Vascular: No carotid bruit.  Cardiovascular:     Rate and Rhythm: Normal rate and regular rhythm.  Pulmonary:     Effort: Pulmonary effort is normal.     Comments: Right sided upper  and lower lung crackles Musculoskeletal:     Cervical back: Normal range of motion and neck supple. No rigidity or tenderness.     Right lower leg: No edema.     Left lower leg: No edema.  Lymphadenopathy:     Cervical: Cervical adenopathy present.  Neurological:     General: No focal deficit present.     Mental Status: He is alert and oriented to person, place, and time.  Psychiatric:        Mood and Affect: Mood normal.          Assessment & Plan:  Marland KitchenMarland KitchenSeamon was seen today for cough.  Diagnoses and all orders for this visit:  Sinobronchitis -     azithromycin (ZITHROMAX Z-PAK) 250 MG tablet; Take 2 tablets (500 mg) on  Day 1,  followed by 1 tablet (250 mg) once daily on Days 2 through 5. -     predniSONE  (DELTASONE) 20 MG tablet; Take two tablets once a day for 5 days. -     albuterol (VENTOLIN HFA) 108 (90 Base) MCG/ACT inhaler; Inhale 2 puffs into the lungs every 6 (six) hours as needed. -     ipratropium-albuterol (DUONEB) 0.5-2.5 (3) MG/3ML nebulizer solution 3 mL   Pt is a smoker with no dx of COPD Zpak and prednisone sent to pharmacy Albuterol inhaler as needed for rescue Pt responded well to nebulizer  Declined smoking cessation Follow up as needed of if symptoms worsen or persist  Iran Planas, PA-C

## 2023-02-13 NOTE — Patient Instructions (Signed)

## 2023-03-15 ENCOUNTER — Other Ambulatory Visit: Payer: Self-pay | Admitting: Family Medicine

## 2023-03-27 ENCOUNTER — Encounter: Payer: Self-pay | Admitting: *Deleted

## 2023-05-05 DIAGNOSIS — M1611 Unilateral primary osteoarthritis, right hip: Secondary | ICD-10-CM | POA: Diagnosis not present

## 2023-05-05 DIAGNOSIS — Z96641 Presence of right artificial hip joint: Secondary | ICD-10-CM | POA: Diagnosis not present

## 2023-06-13 ENCOUNTER — Other Ambulatory Visit: Payer: Self-pay | Admitting: Family Medicine

## 2023-06-26 ENCOUNTER — Encounter: Payer: Self-pay | Admitting: Family Medicine

## 2023-06-26 ENCOUNTER — Ambulatory Visit: Payer: Medicare Other | Admitting: Family Medicine

## 2023-06-26 VITALS — BP 149/76 | HR 75 | Ht 64.0 in | Wt 221.0 lb

## 2023-06-26 DIAGNOSIS — I10 Essential (primary) hypertension: Secondary | ICD-10-CM

## 2023-06-26 DIAGNOSIS — Z720 Tobacco use: Secondary | ICD-10-CM | POA: Diagnosis not present

## 2023-06-26 DIAGNOSIS — D126 Benign neoplasm of colon, unspecified: Secondary | ICD-10-CM | POA: Diagnosis not present

## 2023-06-26 NOTE — Progress Notes (Signed)
Erik Estrada - 72 y.o. male MRN [id number]  Date of birth: February 22, 1951  Subjective Chief Complaint  Patient presents with   Hypertension    HPI Erik Estrada is a 72 year old male here today for follow-up visit.  Reports he is feeling pretty well.  He has discontinued his antihypertensive medications.  He believes that this is causing him to feel bad and because the arthritis in his hip and knee.  He reports being skeptical of doctors and medication at this point.  He does not want to have any screenings including lung cancer screening, colon cancer screening.  He does continue to smoke about 2 packs/day.    ROS:  A comprehensive ROS was completed and negative except as noted per HPI  Allergies  Allergen Reactions   Atorvastatin Calcium Other (See Comments)    Myalgia   Hydrochlorothiazide Other (See Comments)    Fatigue   Lipitor [Atorvastatin Calcium] Other (See Comments)    Myalgia    Past Medical History:  Diagnosis Date   Anxiety    Arthritis    Essential hypertension, benign 06/29/2011   Hemorrhoids 08/01/2018   History of kidney stones    Morbid obesity ([facility]) 10/12/2018   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  Plan for 5-year recheck.   Vitamin D deficiency 08/01/2017    Past Surgical History:  Procedure Laterality Date   KIDNEY STONE SURGERY     SHOULDER SURGERY  December 2010   Left   TOTAL HIP ARTHROPLASTY Right 04/19/2022   Procedure: RIGHT TOTAL HIP ARTHROPLASTY ANTERIOR APPROACH;  Surgeon: Marcene Corning, MD;  Location: [facility];  Service: Orthopedics;  Laterality: Right;   TOTAL KNEE ARTHROPLASTY Left 09/20/2022   Procedure: LEFT TOTAL KNEE ARTHROPLASTY;  Surgeon: Marcene Corning, MD;  Location: [facility];  Service: Orthopedics;  Laterality: Left;   UMBILICAL HERNIA REPAIR      Social History   Socioeconomic History   Marital  status: Married    Spouse name: Erik Estrada   Number of children: 2   Years of education: Not on file   Highest education level: Not on file  Occupational History   Occupation: Adult nurse    Comment: [employer]  Tobacco Use   Smoking status: Every Day    Current packs/day: 1.50    Average packs/day: 1.5 packs/day for 22.0 years (33.0 ttl pk-yrs)    Types: Cigarettes   Smokeless tobacco: Never  Vaping Use   Vaping status: Never Used  Substance and Sexual Activity   Alcohol use: No   Drug use: No   Sexual activity: Yes    Partners: Female  Other Topics Concern   Not on file  Social History Narrative   2 caffeinated drinks daily. No regular exercise he does have a physical job.   Social Determinants of Health   Financial Resource Strain: Not on file  Food Insecurity: Not on file  Transportation Needs: Not on file  Physical Activity: Not on file  Stress: Not on file  Social Connections: Unknown (04/22/2022)   Received from [facility]   Social Network    Social Network: Not on file    Family History  Problem Relation Age of Onset   Colon cancer Brother    Hypertension Mother    Parkinson's disease Mother    Stroke Brother     Health Maintenance  Topic Date Due  DTaP/Tdap/Td (3 - Td or Tdap) 11/15/2022   COVID-19 Vaccine (3 - Pfizer risk series) 09/26/2023 (Originally 03/24/2020)   Medicare Annual Wellness (AWV)  09/26/2023 (Originally 1951-05-18)   Zoster Vaccines- Shingrix (1 of 2) 09/26/2023 (Originally 10/22/1972)   Lung Cancer Screening  06/25/2024 (Originally 10/23/2003)   Pneumonia Vaccine 39+ Years old (3 of 3 - PPSV23 or PCV20) 06/25/2024 (Originally 08/01/2022)   Colonoscopy  06/25/2024 (Originally 11/29/2018)   INFLUENZA VACCINE  07/13/2023   Hepatitis C Screening  Completed   HPV VACCINES  Aged Out      ----------------------------------------------------------------------------------------------------------------------------------------------------------------------------------------------------------------- Physical Exam BP (!) 149/76   Pulse 75   Ht 5\' 4"  (1.626 m)   Wt 221 lb (100.2 kg)   SpO2 99%   BMI 37.93 kg/m   Physical Exam Constitutional:      Appearance: Normal appearance.  HENT:     Head: Normocephalic and atraumatic.  Eyes:     General: No scleral icterus. Cardiovascular:     Rate and Rhythm: Normal rate and regular rhythm.  Pulmonary:     Effort: Pulmonary effort is normal.     Breath sounds: Normal breath sounds.  Neurological:     Mental Status: He is alert.  Psychiatric:        Mood and Affect: Mood normal.        Behavior: Behavior normal.     ------------------------------------------------------------------------------------------------------------------------------------------------------------------------------------------------------------------- Assessment and Plan  Essential hypertension, benign Please discontinue medication and does not want to add anything back on.  He understands the risk of untreated hypertension including worsening of cardiovascular disease and increased risk of stroke.  Encouraged smoking cessation as well as low-sodium diet.  Tubular adenoma of colon He does not wish to have repeat colon cancer screening.  He understands the risk of not screening especially with his prior history of tubular adenoma.  Tobacco abuse Counseled on smoking cessation.  He has not interested in quitting.   No orders of the defined types were placed in this encounter.   Return in about 6 months (around 12/27/2023) for HTN.    This visit occurred during the SARS-CoV-2 public health emergency.  Safety protocols were in place, including screening questions prior to the visit, additional usage of staff PPE, and extensive cleaning of exam  room while observing appropriate contact time as indicated for disinfecting solutions.

## 2023-06-26 NOTE — Progress Notes (Signed)
Patient has declined: immunizations, colonoscopy, and Lung screening

## 2023-06-26 NOTE — Assessment & Plan Note (Signed)
Counseled on smoking cessation.  He has not interested in quitting.

## 2023-06-26 NOTE — Assessment & Plan Note (Signed)
Please discontinue medication and does not want to add anything back on.  He understands the risk of untreated hypertension including worsening of cardiovascular disease and increased risk of stroke.  Encouraged smoking cessation as well as low-sodium diet.

## 2023-06-26 NOTE — Assessment & Plan Note (Signed)
He does not wish to have repeat colon cancer screening.  He understands the risk of not screening especially with his prior history of tubular adenoma.

## 2023-09-08 ENCOUNTER — Other Ambulatory Visit: Payer: Self-pay | Admitting: Family Medicine

## 2023-09-08 DIAGNOSIS — I10 Essential (primary) hypertension: Secondary | ICD-10-CM

## 2023-09-09 ENCOUNTER — Other Ambulatory Visit: Payer: Self-pay | Admitting: Family Medicine

## 2023-09-28 DIAGNOSIS — L814 Other melanin hyperpigmentation: Secondary | ICD-10-CM | POA: Diagnosis not present

## 2023-09-28 DIAGNOSIS — L281 Prurigo nodularis: Secondary | ICD-10-CM | POA: Diagnosis not present

## 2023-09-28 DIAGNOSIS — D225 Melanocytic nevi of trunk: Secondary | ICD-10-CM | POA: Diagnosis not present

## 2023-09-28 DIAGNOSIS — L821 Other seborrheic keratosis: Secondary | ICD-10-CM | POA: Diagnosis not present

## 2023-10-04 DIAGNOSIS — M1712 Unilateral primary osteoarthritis, left knee: Secondary | ICD-10-CM | POA: Diagnosis not present

## 2023-11-21 ENCOUNTER — Ambulatory Visit (INDEPENDENT_AMBULATORY_CARE_PROVIDER_SITE_OTHER): Payer: Medicare Other | Admitting: Family Medicine

## 2023-11-21 ENCOUNTER — Other Ambulatory Visit: Payer: Self-pay | Admitting: Family Medicine

## 2023-11-21 ENCOUNTER — Encounter: Payer: Self-pay | Admitting: Family Medicine

## 2023-11-21 VITALS — BP 169/93 | HR 95 | Ht 64.0 in | Wt 220.0 lb

## 2023-11-21 DIAGNOSIS — J984 Other disorders of lung: Secondary | ICD-10-CM | POA: Diagnosis not present

## 2023-11-21 DIAGNOSIS — R079 Chest pain, unspecified: Secondary | ICD-10-CM

## 2023-11-21 DIAGNOSIS — F17210 Nicotine dependence, cigarettes, uncomplicated: Secondary | ICD-10-CM | POA: Diagnosis present

## 2023-11-21 DIAGNOSIS — Z888 Allergy status to other drugs, medicaments and biological substances status: Secondary | ICD-10-CM | POA: Diagnosis not present

## 2023-11-21 DIAGNOSIS — I517 Cardiomegaly: Secondary | ICD-10-CM | POA: Diagnosis not present

## 2023-11-21 DIAGNOSIS — I25119 Atherosclerotic heart disease of native coronary artery with unspecified angina pectoris: Secondary | ICD-10-CM | POA: Diagnosis not present

## 2023-11-21 DIAGNOSIS — I214 Non-ST elevation (NSTEMI) myocardial infarction: Secondary | ICD-10-CM | POA: Insufficient documentation

## 2023-11-21 DIAGNOSIS — I351 Nonrheumatic aortic (valve) insufficiency: Secondary | ICD-10-CM | POA: Diagnosis not present

## 2023-11-21 DIAGNOSIS — Z7982 Long term (current) use of aspirin: Secondary | ICD-10-CM | POA: Diagnosis not present

## 2023-11-21 DIAGNOSIS — I25110 Atherosclerotic heart disease of native coronary artery with unstable angina pectoris: Secondary | ICD-10-CM | POA: Diagnosis present

## 2023-11-21 DIAGNOSIS — R7303 Prediabetes: Secondary | ICD-10-CM

## 2023-11-21 DIAGNOSIS — Z79899 Other long term (current) drug therapy: Secondary | ICD-10-CM | POA: Diagnosis not present

## 2023-11-21 DIAGNOSIS — I10 Essential (primary) hypertension: Secondary | ICD-10-CM | POA: Diagnosis not present

## 2023-11-21 DIAGNOSIS — I359 Nonrheumatic aortic valve disorder, unspecified: Secondary | ICD-10-CM | POA: Diagnosis not present

## 2023-11-21 DIAGNOSIS — E785 Hyperlipidemia, unspecified: Secondary | ICD-10-CM | POA: Diagnosis present

## 2023-11-21 DIAGNOSIS — Z6837 Body mass index (BMI) 37.0-37.9, adult: Secondary | ICD-10-CM | POA: Diagnosis not present

## 2023-11-21 DIAGNOSIS — M1990 Unspecified osteoarthritis, unspecified site: Secondary | ICD-10-CM | POA: Diagnosis present

## 2023-11-21 DIAGNOSIS — E559 Vitamin D deficiency, unspecified: Secondary | ICD-10-CM | POA: Diagnosis present

## 2023-11-21 DIAGNOSIS — I2089 Other forms of angina pectoris: Secondary | ICD-10-CM | POA: Diagnosis not present

## 2023-11-21 DIAGNOSIS — Z8249 Family history of ischemic heart disease and other diseases of the circulatory system: Secondary | ICD-10-CM | POA: Diagnosis not present

## 2023-11-21 DIAGNOSIS — F32A Depression, unspecified: Secondary | ICD-10-CM | POA: Diagnosis present

## 2023-11-21 LAB — TROPONIN T: Troponin T (Highly Sensitive): 445 ng/L (ref 0–22)

## 2023-11-21 MED ORDER — NITROGLYCERIN 0.4 MG SL SUBL: 0.4000 mg | SUBLINGUAL_TABLET | SUBLINGUAL | 0 refills | Status: AC | PRN

## 2023-11-21 MED ORDER — LOSARTAN POTASSIUM 50 MG PO TABS: 50.0000 mg | ORAL_TABLET | Freq: Every day | ORAL | 0 refills | Status: DC

## 2023-11-21 NOTE — Progress Notes (Signed)
Erik Estrada - 72 y.o. male MRN [id number]  Date of birth: [birth year]  Subjective Chief Complaint  Patient presents with   Abdominal Pain   Emesis   Arm Pain    HPI Erik Estrada is a 72 y.o. male here today with complaint of chest pain.  He reports that over the past couple of weeks he has had  2 episodes of deep "burning"  chest pain.  This has occurred mostly during strenuous activity such as uphill walking.  His most recent episode was last night. He has had associated nausea and vomiting.  Denies feeling of pressure but he did have radiation to the L  arm.  Pain lasted for about and hour or so and then went away.  He denies pain today.  He has not had dyspnea at rest or edema.  Cardiac risk factors include age, sex, HTN, HLD, smoking.    BP is elevated today.  He did stop taking BP medication because he felt it made him feel "sluggish".     ROS:  A comprehensive ROS was completed and negative except as noted per HPI  Allergies  Allergen Reactions   Atorvastatin Calcium Other (See Comments)    Myalgia   Hydrochlorothiazide Other (See Comments)    Fatigue   Lipitor [Atorvastatin Calcium] Other (See Comments)    Myalgia    Past Medical History:  Diagnosis Date   Anxiety    Arthritis    Essential hypertension, benign 06/29/2011   Hemorrhoids 08/01/2018   History of kidney stones    Morbid obesity ([facility]) 10/12/2018   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  Plan for 5-year recheck.   Vitamin D deficiency 08/01/2017    Past Surgical History:  Procedure Laterality Date   KIDNEY STONE SURGERY     SHOULDER SURGERY  December 2010   Left   TOTAL HIP ARTHROPLASTY Right 04/19/2022   Procedure: RIGHT TOTAL HIP ARTHROPLASTY ANTERIOR APPROACH;  Surgeon: Marcene Corning, MD;  Location: [facility];  Service: Orthopedics;  Laterality: Right;    TOTAL KNEE ARTHROPLASTY Left 09/20/2022   Procedure: LEFT TOTAL KNEE ARTHROPLASTY;  Surgeon: Marcene Corning, MD;  Location: [facility];  Service: Orthopedics;  Laterality: Left;   UMBILICAL HERNIA REPAIR      Social History   Socioeconomic History   Marital status: Married    Spouse name: Aram Beecham   Number of children: 2   Years of education: Not on file   Highest education level: Not on file  Occupational History   Occupation: Adult nurse    Comment: [employer]  Tobacco Use   Smoking status: Every Day    Current packs/day: 1.50    Average packs/day: 1.5 packs/day for 22.0 years (33.0 ttl pk-yrs)    Types: Cigarettes   Smokeless tobacco: Never  Vaping Use   Vaping status: Never Used  Substance and Sexual Activity   Alcohol use: No   Drug use: No   Sexual activity: Yes    Partners: Female  Other Topics Concern   Not on file  Social History Narrative   2 caffeinated drinks daily. No regular exercise he does have a physical job.   Social Determinants of Health   Financial Resource Strain: Not on file  Food Insecurity: Not on file  Transportation Needs: Not on file  Physical Activity: Not on file  Stress: Not on file  Social Connections: Unknown (04/22/2022)   Received from [facility], [facility]   Social Network    Social Network: Not on file    Family History  Problem Relation Age of Onset   Colon cancer Brother    Hypertension Mother    Parkinson's disease Mother    Stroke Brother     Health Maintenance  Topic Date Due   Medicare Annual Wellness (AWV)  Never done   Zoster Vaccines- Shingrix (1 of 2) Never done   COVID-19 Vaccine (3 - Pfizer risk series) 03/24/2020   DTaP/Tdap/Td (3 - Td or Tdap) 11/15/2022   INFLUENZA VACCINE  03/11/2024 (Originally 07/13/2023)   Lung Cancer Screening  06/25/2024 (Originally 10/23/2003)   Pneumonia Vaccine 46+ Years old (3 of 3 - PPSV23 or PCV20) 06/25/2024 (Originally 08/01/2022)   Colonoscopy  06/25/2024  (Originally 11/29/2018)   Hepatitis C Screening  Completed   HPV VACCINES  Aged Out     ----------------------------------------------------------------------------------------------------------------------------------------------------------------------------------------------------------------- Physical Exam BP (!) 169/93 (BP Location: Left Arm, Patient Position: Sitting, Cuff Size: Large)   Pulse 95   Ht 5\' 4"  (1.626 m)   Wt 220 lb (99.8 kg)   SpO2 98%   BMI 37.76 kg/m   Physical Exam Constitutional:      Appearance: He is well-developed.  HENT:     Head: Normocephalic and atraumatic.  Eyes:     General: No scleral icterus. Cardiovascular:     Rate and Rhythm: Normal rate and regular rhythm.  Pulmonary:     Effort: Pulmonary effort is normal.     Breath sounds: Normal breath sounds.  Neurological:     Mental Status: He is alert.  Psychiatric:        Mood and Affect: Mood normal.        Behavior: Behavior normal.      EKG: NSR with non-specific T-wave changes.  No ST elevation.  No significant change from previous tracing.  ------------------------------------------------------------------------------------------------------------------------------------------------------------------------------------------------------------------- Assessment and Plan  Angina of effort [facility]) Symptoms concerning for angina.  Urgent cardiology referral entered.  Discussed need to get BP under better control and stop smoking.  At [facility] NTG as needed for chest pain.  Checking labs including STAT troponin.  Discussed signs/symptoms of heart attack and to seek emergency care if symptoms return or worsen.   Addendum:  Troponin T returned significantly elevated. Contacted patient and advised to be seen in ED ASAP.  He expresses understanding.     Essential hypertension, benign BP is elevated.  Change lisinopril to losartan to see if this is better tolerated by him.   >45 minutes spent  including pre visit preparation, review of prior notes and labs, encounter with patient and same day documentation.   Meds ordered this encounter  Medications   nitroGLYCERIN (NITROSTAT) 0.4 MG SL tablet    Sig: Place 1 tablet (0.4 mg total) under the tongue every 5 (five) minutes as needed for chest pain (up to three doses).    Dispense:  50 tablet    Refill:  0   losartan (COZAAR) 50 MG tablet    Sig: Take 1 tablet (50 mg total) by mouth daily.    Dispense:  90 tablet    Refill:  0    No follow-ups on file.    This visit occurred during the SARS-CoV-2 public health emergency.  Safety protocols were in place, including screening questions prior to the visit, additional usage of staff PPE, and extensive cleaning of exam room while observing appropriate contact time as  indicated for disinfecting solutions.

## 2023-11-21 NOTE — Patient Instructions (Addendum)
Start losartan for treatment of blood pressure.   Take 81mg  ASA.   If having chest pain try nitroglycerin.  If still having chest pain after 3 doses contact 911.   You will be contacted for cardiology appt.      Angina  Angina is discomfort or pain in the chest, neck, arm, jaw, or back. The discomfort is caused by a lack of blood in the middle layer of the heart wall. The middle layer of the heart wall is called the myocardium. What are the causes? This condition is caused by a buildup of fat and cholesterol, or plaque, in your arteries. This buildup narrows the arteries and makes it hard for blood to flow. What increases the risk? Main risks High levels of cholesterol in your blood. High blood pressure. Diabetes. Family history of heart disease. Not exercising or moving enough. Depression. Having had radiation treatment on the left side of your chest. Other risks Tobacco use. Too much body weight (obesity). A diet that is high in unhealthy fats (saturated fats). Stress. Using drugs, such as cocaine. Risks for women Being older than 55 years. Being in menopause. This is the time when a woman no longer has a menstrual period. What are the signs or symptoms? Symptoms in all people Chest pain, which may: Feel like a crushing or squeezing in the chest. Feel like a tightness, pressure, fullness, or heaviness in the chest. Last for more than a few minutes at a time. Stop and come back (recur) after a few minutes. Pain in the neck, arm, jaw, or back. Heartburn or upset stomach (indigestion) for no reason. Being short of breath. Feeling like you may vomit (nauseous). Sudden cold sweats. Symptoms in women and people with diabetes Tiredness. Worry and anxiety. Weakness. Dizziness or fainting. How is this treated? This condition may be treated with: Medicines. These can be given to prevent blood clots, stop a heart attack, lower blood pressure, or treat other risk  factors. A procedure to widen a narrowed or blocked artery in the heart. Surgery to allow blood to go around a blocked artery. Follow these instructions at home: Medicines Take over-the-counter and prescription medicines only as told by your doctor. Do not take these medicines unless your doctor says that you can: NSAIDs. These include: Ibuprofen. Naproxen. Vitamin supplements that have vitamin A, vitamin E, or both. Hormone therapy that contains estrogen with or without progestin. Eating and drinking  Eat a heart-healthy diet that includes: Lots of fresh fruits and vegetables. Whole grains. Low-fat (lean) protein. Low-fat dairy products. Follow instructions from your doctor about what you cannot eat or drink. Activity Follow an exercise program that your doctor tells you. Talk with your doctor about joining a program to make your heart strong again (cardiac rehab). When you feel tired, take a break. Plan breaks if you know you are going to feel tired. Lifestyle  Do not smoke or use any products that contain nicotine or tobacco. If you need help quitting, ask your doctor. If your doctor says you can drink alcohol: Limit how much you have to: 0-1 drink a day for women who are not pregnant. 0-2 drinks a day for men. Know how much alcohol is in your drink. In the [place] one drink equals one 12 oz bottle of beer (355 mL), one 5 oz glass of wine (148 mL), or one 1 oz glass of hard liquor (44 mL). General instructions Stay at a healthy weight. If told to lose weight, work with your  doctor to do lose weight safely. Learn to manage stress. If you need help, ask your doctor. Keep your vaccines up to date. Get a flu shot every year. Talk with your doctor if you feel sad. Take a screening test to see if you are at risk for depression. Work with your doctor to manage any other health problems that you have. These may include diabetes or high blood pressure. Keep all follow-up visits. Get  help right away if: You have pain in your chest, neck, arm, jaw, or back, and the pain: Lasts more than a few minutes. Comes back. Does not get better after you take medicine under your tongue (sublingual nitroglycerin). Keeps getting worse. Comes more often. You have any of these problems: Sweating a lot. Heartburn or upset stomach. Shortness of breath. Trouble breathing. Feeling like you may vomit. Vomiting. Feeling more tired than normal. Feeling nervous or worrying more than normal. Weakness. You are dizzy or light-headed all of a sudden. You faint. These symptoms may be an emergency. Get help right away. Call your local emergency services (911 in the [place] Do not wait to see if the symptoms will go away. Do not drive yourself to the hospital. Summary Angina is discomfort or pain in the chest, neck, arm, neck, or back. Symptoms include chest pain, heartburn or upset stomach, and shortness of breath. Women or people with diabetes may have other symptoms, such as feeling nervous, being worried, or being weak or tired. Take all medicines only as told by your doctor. You should eat a heart-healthy diet and follow an exercise program. This information is not intended to replace advice given to you by your health care provider. Make sure you discuss any questions you have with your health care provider. Document Revised: 05/22/2020 Document Reviewed: 05/22/2020 Elsevier Patient Education  2024 [employer].

## 2023-11-21 NOTE — Assessment & Plan Note (Signed)
BP is elevated.  Change lisinopril to losartan to see if this is better tolerated by him.

## 2023-11-21 NOTE — Assessment & Plan Note (Addendum)
Symptoms concerning for angina.  Urgent cardiology referral entered.  Discussed need to get BP under better control and stop smoking.  At [facility] NTG as needed for chest pain.  Checking labs including STAT troponin.  Discussed signs/symptoms of heart attack and to seek emergency care if symptoms return or worsen.   Addendum:  Troponin T returned significantly elevated. Contacted patient and advised to be seen in ED ASAP.  He expresses understanding.

## 2023-11-22 LAB — CMP14+EGFR
ALT: 16 [IU]/L (ref 0–44)
AST: 41 [IU]/L — ABNORMAL HIGH (ref 0–40)
Albumin: 4.5 g/dL (ref 3.9–4.9)
Alkaline Phosphatase: 96 [IU]/L (ref 44–121)
BUN/Creatinine Ratio: 10 (ref 10–24)
BUN: 9 mg/dL (ref 8–27)
Bilirubin Total: 0.8 mg/dL (ref 0.0–1.2)
CO2: 19 mmol/L — ABNORMAL LOW (ref 20–29)
Calcium: 9.5 mg/dL (ref 8.6–10.2)
Chloride: 101 mmol/L (ref 96–106)
Creatinine, Ser: 0.87 mg/dL (ref 0.76–1.27)
Globulin, Total: 2.9 g/dL (ref 1.5–4.5)
Glucose: 111 mg/dL — ABNORMAL HIGH (ref 70–99)
Potassium: 4.4 mmol/L (ref 3.5–5.2)
Sodium: 139 mmol/L (ref 134–144)
Total Protein: 7.4 g/dL (ref 6.0–8.5)
eGFR: 93 mL/min/{1.73_m2} (ref >59)

## 2023-11-22 LAB — CBC WITH DIFFERENTIAL/PLATELET
Basophils Absolute: 0.1 10*3/uL (ref 0.0–0.2)
Basos: 1 %
EOS (ABSOLUTE): 0.1 10*3/uL (ref 0.0–0.4)
Eos: 1 %
Hematocrit: 50.5 % (ref 37.5–51.0)
Hemoglobin: 17.6 g/dL (ref 13.0–17.7)
Immature Grans (Abs): 0.1 10*3/uL (ref 0.0–0.1)
Immature Granulocytes: 1 %
Lymphocytes Absolute: 2.2 10*3/uL (ref 0.7–3.1)
Lymphs: 17 %
MCH: 32.2 pg (ref 26.6–33.0)
MCHC: 34.9 g/dL (ref 31.5–35.7)
MCV: 93 fL (ref 79–97)
Monocytes Absolute: 1.2 10*3/uL — ABNORMAL HIGH (ref 0.1–0.9)
Monocytes: 9 %
Neutrophils Absolute: 9.3 10*3/uL — ABNORMAL HIGH (ref 1.4–7.0)
Neutrophils: 71 %
Platelets: 224 10*3/uL (ref 150–450)
RBC: 5.46 x10E6/uL (ref 4.14–5.80)
RDW: 12.6 % (ref 11.6–15.4)
WBC: 13 10*3/uL — ABNORMAL HIGH (ref 3.4–10.8)

## 2023-11-22 LAB — HEMOGLOBIN A1C
Est. average glucose Bld gHb Est-mCnc: 140 mg/dL
Hgb A1c MFr Bld: 6.5 % — ABNORMAL HIGH (ref 4.8–5.6)

## 2023-11-27 ENCOUNTER — Telehealth: Payer: Self-pay

## 2023-11-27 NOTE — Transitions of Care (Post Inpatient/ED Visit) (Signed)
   11/27/2023  Name: Erik Estrada MRN: [id number] DOB: [birth year]  Today's TOC FU Call Status: Today's TOC FU Call Status:: Successful TOC FU Call Completed TOC FU Call Complete Date: 11/27/23 Patient's Name and Date of Birth confirmed.  Transition Care Management Follow-up Telephone Call Date of Discharge: 11/24/23 Discharge Facility: Other ([facility]) Name of Other ([facility]) [facility]charge Facility: [facility] Type of Discharge: Inpatient Admission Primary Inpatient Discharge Diagnosis:: NSTEMI How have you been since you were released from the hospital?: Better Any questions or concerns?: Yes Patient Questions/Concerns:: Patient has follow up appt. with [facility]ologist 11/30/23 the same time as his HFU with PCP.  He requested cancel that appoinment and he will call for follow up appointment, Patient Questions/Concerns Addressed: Other:  Items Reviewed: Did you receive and understand the discharge instructions provided?: Yes Medications obtained,verified, and reconciled?: Yes (Medications Reviewed) Any new allergies since your discharge?: No Dietary orders reviewed?: No Do you have support at home?: Yes People in Home: spouse Name of Support/Comfort Primary Source: Aram Beecham  Medications Reviewed Today: Medications Reviewed Today   Medications were not reviewed in this encounter     Home Care and Equipment/Supplies: Were Home Health Services Ordered?: No Any new equipment or medical supplies ordered?: No  Functional Questionnaire: Do you need assistance with bathing/showering or dressing?: No Do you need assistance with meal preparation?: No Do you need assistance with eating?: No Do you have difficulty maintaining continence: No Do you need assistance with getting out of bed/getting out of a chair/moving?: No Do you have difficulty managing or taking your medications?: No  Follow up appointments reviewed: PCP Follow-up appointment  confirmed?: Yes Date of PCP follow-up appointment?: 11/30/23 Follow-up Provider: Dr. Ashley Royalty (Patient has cardiologist appt the same time as this appt.  Requested cancellation and he is going to call and reschedule) Specialist Hospital Follow-up appointment confirmed?: Yes Date of Specialist follow-up appointment?: 11/30/23 Follow-Up Specialty Provider:: Dr. Chales Abrahams Do you need transportation to your follow-up appointment?: No Do you understand care options if your condition(s) worsen?: Yes-patient verbalized understanding  SDOH Interventions Today    Flowsheet Row Most Recent Value  SDOH Interventions   Food Insecurity Interventions Intervention Not Indicated  Housing Interventions Intervention Not Indicated  Transportation Interventions Intervention Not Indicated  Utilities Interventions Intervention Not Indicated      Jodelle Gross RN, BSN, CCM RN Care Manager  Transitions of Care  [facility]  [phone]

## 2023-11-30 ENCOUNTER — Encounter: Payer: Self-pay | Admitting: Family Medicine

## 2023-11-30 ENCOUNTER — Inpatient Hospital Stay: Payer: Medicare Other | Admitting: Family Medicine

## 2023-11-30 ENCOUNTER — Ambulatory Visit (INDEPENDENT_AMBULATORY_CARE_PROVIDER_SITE_OTHER): Payer: Medicare Other | Admitting: Family Medicine

## 2023-11-30 VITALS — BP 134/71 | HR 79 | Ht 64.0 in | Wt 223.0 lb

## 2023-11-30 DIAGNOSIS — I10 Essential (primary) hypertension: Secondary | ICD-10-CM | POA: Diagnosis not present

## 2023-11-30 DIAGNOSIS — I214 Non-ST elevation (NSTEMI) myocardial infarction: Secondary | ICD-10-CM

## 2023-11-30 DIAGNOSIS — I252 Old myocardial infarction: Secondary | ICD-10-CM

## 2023-11-30 DIAGNOSIS — F17210 Nicotine dependence, cigarettes, uncomplicated: Secondary | ICD-10-CM | POA: Diagnosis not present

## 2023-11-30 DIAGNOSIS — F17200 Nicotine dependence, unspecified, uncomplicated: Secondary | ICD-10-CM | POA: Insufficient documentation

## 2023-11-30 MED ORDER — NICOTINE 7 MG/24HR TD PT24: 7.0000 mg | MEDICATED_PATCH | Freq: Every day | TRANSDERMAL | 0 refills | Status: DC

## 2023-11-30 MED ORDER — NICOTINE 21 MG/24HR TD PT24: 21.0000 mg | MEDICATED_PATCH | Freq: Every day | TRANSDERMAL | 0 refills | Status: DC

## 2023-11-30 MED ORDER — NICOTINE 14 MG/24HR TD PT24: 14.0000 mg | MEDICATED_PATCH | Freq: Every day | TRANSDERMAL | 0 refills | Status: DC

## 2023-11-30 NOTE — Progress Notes (Signed)
Erik Estrada - 72 y.o. male MRN [id number]  Date of birth: 1950-12-14  Subjective Chief Complaint  Patient presents with   Hospitalization Follow-up    HPI Erik Estrada is a 72 y.o. male here today for follow up.   Recent hospitalization for NSTEMI.  Seen in clinic last week after having episode of chest pain.  Troponin returned elevated.  Referred to ED.  Troponins remained elevated.  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  He has PCI with placement of synergy DES.  Echo with preserved EF of 50-55%.  Discharged with brilinta, crestor and metoprolol.   Concerned bout $300/month cost of Brilinta. He reports that he is feeling a little fatigued.  He has not had any additional chest pain since discharge.  Denies dyspnea.  ROS:  A comprehensive ROS was completed and negative except as noted per HPI    Allergies  Allergen Reactions   Atorvastatin Calcium Other (See Comments)    Myalgia   Hydrochlorothiazide Other (See Comments)    Fatigue   Lipitor [Atorvastatin Calcium] Other (See Comments)    Myalgia    Past Medical History:  Diagnosis Date   Anxiety    Arthritis    Essential hypertension, benign 06/29/2011   Hemorrhoids 08/01/2018   History of kidney stones    Morbid obesity (HCC) 10/12/2018   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  Plan for 5-year recheck.   Vitamin D deficiency 08/01/2017    Past Surgical History:  Procedure Laterality Date   KIDNEY STONE SURGERY     SHOULDER SURGERY  December 2010   Left   TOTAL HIP ARTHROPLASTY Right 04/19/2022   Procedure: RIGHT TOTAL HIP ARTHROPLASTY ANTERIOR APPROACH;  Surgeon: Marcene Corning, MD;  Location: [facility];  Service: Orthopedics;  Laterality: Right;   TOTAL KNEE ARTHROPLASTY Left 09/20/2022   Procedure: LEFT TOTAL KNEE ARTHROPLASTY;  Surgeon:  Marcene Corning, MD;  Location: [facility];  Service: Orthopedics;  Laterality: Left;   UMBILICAL HERNIA REPAIR      Social History   Socioeconomic History   Marital status: Married    Spouse name: Erik Estrada   Number of children: 2   Years of education: Not on file   Highest education level: Not on file  Occupational History   Occupation: Adult nurse    Comment: [employer]  Tobacco Use   Smoking status: Every Day    Current packs/day: 1.50    Average packs/day: 1.5 packs/day for 22.0 years (33.0 ttl pk-yrs)    Types: Cigarettes   Smokeless tobacco: Never  Vaping Use   Vaping status: Never Used  Substance and Sexual Activity   Alcohol use: No   Drug use: No   Sexual activity: Yes    Partners: Female  Other Topics Concern   Not on file  Social History Narrative   2 caffeinated drinks daily. No regular exercise he does have a physical job.   Social Drivers of Corporate investment banker Strain: Not on file  Food Insecurity: No Food Insecurity (11/27/2023)   Hunger Vital Sign    Worried About Running Out of Food in the Last Year: Never true    Ran Out of Food in the Last Year: Never true  Transportation Needs: No Transportation Needs (11/27/2023)   PRAPARE - Administrator, Civil Service (Medical): No  Lack of Transportation (Non-Medical): No  Physical Activity: Not on file  Stress: No Stress Concern Present (11/23/2023)   Received from [facility] - Occupational Stress Questionnaire    Feeling of Stress : Not at all  Social Connections: Unknown (04/22/2022)   Received from [facility], [facility]   Social Network    Social Network: Not on file    Family History  Problem Relation Age of Onset   Colon cancer Brother    Hypertension Mother    Parkinson's disease Mother    Stroke Brother     Health Maintenance  Topic Date Due   Medicare Annual Wellness (AWV)  Never done   Zoster Vaccines-  Shingrix (1 of 2) Never done   COVID-19 Vaccine (3 - Pfizer risk series) 03/24/2020   DTaP/Tdap/Td (3 - Td or Tdap) 11/15/2022   INFLUENZA VACCINE  03/11/2024 (Originally 07/13/2023)   Lung Cancer Screening  06/25/2024 (Originally 10/23/2003)   Pneumonia Vaccine 67+ Years old (3 of 3 - PPSV23 or PCV20) 06/25/2024 (Originally 08/01/2022)   Colonoscopy  06/25/2024 (Originally 11/29/2018)   Hepatitis C Screening  Completed   HPV VACCINES  Aged Out     ----------------------------------------------------------------------------------------------------------------------------------------------------------------------------------------------------------------- Physical Exam BP 134/71 (BP Location: Left Arm, Patient Position: Sitting, Cuff Size: Large)   Pulse 79   Ht 5\' 4"  (1.626 m)   Wt 223 lb (101.2 kg)   SpO2 98%   BMI 38.28 kg/m   Physical Exam Constitutional:      Appearance: Normal appearance.  HENT:     Head: Normocephalic and atraumatic.  Eyes:     General: No scleral icterus. Cardiovascular:     Rate and Rhythm: Normal rate and regular rhythm.  Pulmonary:     Effort: Pulmonary effort is normal.     Breath sounds: Normal breath sounds.  Musculoskeletal:     Cervical back: Neck supple.  Neurological:     Mental Status: He is alert.  Psychiatric:        Mood and Affect: Mood normal.        Behavior: Behavior normal.     ------------------------------------------------------------------------------------------------------------------------------------------------------------------------------------------------------------------- Assessment and Plan  NSTEMI (non-ST elevated myocardial infarction) (HCC) S/p DES to first diagonal branch.  Currently on Brilinta, ASA, Crestor.  Has upcoming f/u with cardiology.  Encouraged smoking cessation.  Concerned about Brilinta cost.  Referral to pharmacist to see if there is patient assitance available for this.   Nicotine  dependence Counseled on smoking cessation.  Adding nicoderm patches.   Essential hypertension, benign BP is well controlled with current medications.  Recommend continuation.     Meds ordered this encounter  Medications   nicotine (NICODERM CQ) 21 mg/24hr patch    Sig: Place 1 patch (21 mg total) onto the skin daily. Reduce to 14mg  after 2 weeks.    Dispense:  14 patch    Refill:  0   nicotine (NICODERM CQ) 14 mg/24hr patch    Sig: Place 1 patch (14 mg total) onto the skin daily. Reduce to 7mg  after 2 weeks.    Dispense:  14 patch    Refill:  0   nicotine (NICODERM CQ) 7 mg/24hr patch    Sig: Place 1 patch (7 mg total) onto the skin daily.    Dispense:  28 patch    Refill:  0    No follow-ups on file.    This visit occurred during the SARS-CoV-2 public health emergency.  Safety protocols were in place, including  screening questions prior to the visit, additional usage of staff PPE, and extensive cleaning of exam room while observing appropriate contact time as indicated for disinfecting solutions.

## 2023-11-30 NOTE — Assessment & Plan Note (Signed)
BP is well controlled with current medications.  Recommend continuation.   

## 2023-11-30 NOTE — Assessment & Plan Note (Signed)
S/p DES to first diagonal branch.  Currently on Brilinta, ASA, Crestor.  Has upcoming f/u with cardiology.  Encouraged smoking cessation.  Concerned about Brilinta cost.  Referral to pharmacist to see if there is patient assitance available for this.

## 2023-11-30 NOTE — Assessment & Plan Note (Signed)
Counseled on smoking cessation.  Adding nicoderm patches.

## 2023-12-01 DIAGNOSIS — I351 Nonrheumatic aortic (valve) insufficiency: Secondary | ICD-10-CM | POA: Diagnosis not present

## 2023-12-01 DIAGNOSIS — E7800 Pure hypercholesterolemia, unspecified: Secondary | ICD-10-CM | POA: Diagnosis not present

## 2023-12-01 DIAGNOSIS — I10 Essential (primary) hypertension: Secondary | ICD-10-CM | POA: Diagnosis not present

## 2023-12-01 DIAGNOSIS — I214 Non-ST elevation (NSTEMI) myocardial infarction: Secondary | ICD-10-CM | POA: Diagnosis not present

## 2023-12-01 DIAGNOSIS — I25118 Atherosclerotic heart disease of native coronary artery with other forms of angina pectoris: Secondary | ICD-10-CM | POA: Diagnosis not present

## 2023-12-01 DIAGNOSIS — Z95820 Peripheral vascular angioplasty status with implants and grafts: Secondary | ICD-10-CM | POA: Diagnosis not present

## 2023-12-01 LAB — CBC WITH DIFFERENTIAL/PLATELET
Basophils Absolute: 0.1 10*3/uL (ref 0.0–0.2)
Basos: 1 %
EOS (ABSOLUTE): 0.2 10*3/uL (ref 0.0–0.4)
Eos: 2 %
Hematocrit: 45.3 % (ref 37.5–51.0)
Hemoglobin: 15.3 g/dL (ref 13.0–17.7)
Immature Grans (Abs): 0.1 10*3/uL (ref 0.0–0.1)
Immature Granulocytes: 1 %
Lymphocytes Absolute: 1.8 10*3/uL (ref 0.7–3.1)
Lymphs: 15 %
MCH: 31.5 pg (ref 26.6–33.0)
MCHC: 33.8 g/dL (ref 31.5–35.7)
MCV: 93 fL (ref 79–97)
Monocytes Absolute: 1 10*3/uL — ABNORMAL HIGH (ref 0.1–0.9)
Monocytes: 9 %
Neutrophils Absolute: 8.5 10*3/uL — ABNORMAL HIGH (ref 1.4–7.0)
Neutrophils: 72 %
Platelets: 242 10*3/uL (ref 150–450)
RBC: 4.85 x10E6/uL (ref 4.14–5.80)
RDW: 12.5 % (ref 11.6–15.4)
WBC: 11.6 10*3/uL — ABNORMAL HIGH (ref 3.4–10.8)

## 2023-12-01 LAB — BASIC METABOLIC PANEL
BUN/Creatinine Ratio: 14 (ref 10–24)
BUN: 14 mg/dL (ref 8–27)
CO2: 19 mmol/L — ABNORMAL LOW (ref 20–29)
Calcium: 9.5 mg/dL (ref 8.6–10.2)
Chloride: 104 mmol/L (ref 96–106)
Creatinine, Ser: 1.02 mg/dL (ref 0.76–1.27)
Glucose: 170 mg/dL — ABNORMAL HIGH (ref 70–99)
Potassium: 4.2 mmol/L (ref 3.5–5.2)
Sodium: 139 mmol/L (ref 134–144)
eGFR: 79 mL/min/{1.73_m2} (ref >59)

## 2023-12-04 ENCOUNTER — Telehealth: Payer: Self-pay

## 2023-12-04 NOTE — Progress Notes (Signed)
Care Guide Pharmacy Note  12/04/2023 Name: Erik Estrada MRN: [id number] DOB: 25-Aug-1951  Referred By: Everrett Coombe, DO Reason for referral: Care Coordination (Outreach to schedule with Pharm d )   Erik Estrada is a 72 y.o. year old male who is a primary care patient of Everrett Coombe, DO.  Erik Estrada was referred to the pharmacist for assistance related to: HTN  Successful contact was made with the patient to discuss pharmacy services including being ready for the pharmacist to call at least 5 minutes before the scheduled appointment time and to have medication bottles and any blood pressure readings ready for review. The patient agreed to meet with the pharmacist via telephone visit on (date/time).12/07/2023  Penne Lash , RMA     [place]  [facility], [facility] Guide  Direct Dial: [phone]  Website: [email or web address]

## 2023-12-07 ENCOUNTER — Other Ambulatory Visit: Payer: Self-pay

## 2023-12-07 NOTE — Progress Notes (Signed)
   12/07/2023  Patient ID: Erik Estrada, male   DOB: 06-Dec-1951, 72 y.o.   MRN: [id number]  Patient outreach to assist with affordability of antiplatelet medication  Patient is currently taking Brilinta, which will not be affordable long-term.  Contacted patient's wife, Aram Beecham (DPR), and they had actually forgotten about our telephone visit.  She does state patient has been changed to Plavix, and the generic may be affordable for them.  I informed her that based on my experiences, this typically is covered by insurance for a reasonable copay.  I am sending my direct contact information in a MyChart message, so they can reach out if copay is not affordable.  Lenna Gilford, PharmD, DPLA

## 2023-12-12 ENCOUNTER — Ambulatory Visit: Payer: Medicare Other | Admitting: Family Medicine

## 2023-12-27 ENCOUNTER — Ambulatory Visit (INDEPENDENT_AMBULATORY_CARE_PROVIDER_SITE_OTHER): Payer: Medicare Other | Admitting: Family Medicine

## 2023-12-27 VITALS — BP 116/65 | HR 66 | Ht 64.0 in | Wt 222.5 lb

## 2023-12-27 DIAGNOSIS — I252 Old myocardial infarction: Secondary | ICD-10-CM | POA: Diagnosis not present

## 2023-12-27 DIAGNOSIS — Z720 Tobacco use: Secondary | ICD-10-CM | POA: Diagnosis not present

## 2023-12-27 DIAGNOSIS — I10 Essential (primary) hypertension: Secondary | ICD-10-CM | POA: Diagnosis not present

## 2023-12-27 NOTE — Assessment & Plan Note (Signed)
 Reinforced need to completely quit smoking.  Offered assistance.  He has patches and gum.

## 2023-12-27 NOTE — Progress Notes (Signed)
 Erik Estrada - 73 y.o. male MRN [id number]  Date of birth: 03-20-1951  Subjective Chief Complaint  Patient presents with   Hypertension    Follow up visit    HPI Erik Estrada is a 73 y.o. male here today for follow up visit.    He had recent MI in December.  S/p stenting to large first diagonal branch.  He has done quite well since this.  He did follow up with cardiology.  He is compliant with all medication and BP has looked much better.  He does continue to smoke but has cut back quite a bit.  He is smoking 1-2 cigarettes every 2-3 days rather than 2ppd.  He has not had any additional chest pain, dyspnea, or nausea.   ROS:  A comprehensive ROS was completed and negative except as noted per HPI  Allergies  Allergen Reactions   Atorvastatin  Calcium  Other (See Comments)    Myalgia   Hydrochlorothiazide  Other (See Comments)    Fatigue   Lipitor [Atorvastatin  Calcium ] Other (See Comments)    Myalgia    Past Medical History:  Diagnosis Date   Anxiety    Arthritis    Essential hypertension, benign 06/29/2011   Hemorrhoids 08/01/2018   History of kidney stones    Morbid obesity (HCC) 10/12/2018   Neurotic excoriations 07/27/2016   Pre-diabetes    Testosterone  deficiency 10/12/2018   Tubular adenoma of colon 10/17/2018   [facility]  Plan for 5-year recheck.   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   Procedure: RIGHT TOTAL HIP ARTHROPLASTY ANTERIOR APPROACH;  Surgeon: Dayne Even, MD;  Location: [facility];  Service: Orthopedics;  Laterality: Right;   TOTAL KNEE ARTHROPLASTY Left 09/20/2022   Procedure: LEFT TOTAL KNEE ARTHROPLASTY;  Surgeon: Dayne Even, MD;  Location: [facility];  Service: Orthopedics;  Laterality: Left;   UMBILICAL HERNIA REPAIR      Social History    Socioeconomic History   Marital status: Married    Spouse name: Adah Acron   Number of children: 2   Years of education: Not on file   Highest education level: 10th grade  Occupational History   Occupation: Adult nurse    Comment: [employer]  Tobacco Use   Smoking status: Every Day    Current packs/day: 1.50    Average packs/day: 1.5 packs/day for 22.0 years (33.0 ttl pk-yrs)    Types: Cigarettes   Smokeless tobacco: Never  Vaping Use   Vaping status: Never Used  Substance and Sexual Activity   Alcohol use: No   Drug use: No   Sexual activity: Yes    Partners: Female  Other Topics Concern   Not on file  Social History Narrative   2 caffeinated drinks daily. No regular exercise he does have a physical job.   Social Drivers of Corporate investment banker Strain: Low Risk  (12/27/2023)   Overall Financial Resource Strain (CARDIA)    Difficulty of Paying Living Expenses: Not very hard  Food Insecurity: No Food Insecurity (12/27/2023)   Hunger Vital Sign    Worried About Running Out of Food in the Last Year: Never true    Ran Out of Food in the Last Year: Never true  Transportation Needs: No Transportation Needs (12/27/2023)   PRAPARE - Transportation  Lack of Transportation (Medical): No    Lack of Transportation (Non-Medical): No  Physical Activity: Insufficiently Active (12/27/2023)   Exercise Vital Sign    Days of Exercise per Week: 2 days    Minutes of Exercise per Session: 30 min  Stress: No Stress Concern Present (12/27/2023)   [employer] of Occupational Health - Occupational Stress Questionnaire    Feeling of Stress : Only a little  Social Connections: Unknown (12/27/2023)   Social Connection and Isolation Panel [NHANES]    Frequency of Communication with Friends and Family: Twice a week    Frequency of Social Gatherings with Friends and Family: Patient declined    Attends Religious Services: More than 4 times per year    Active Member of Clubs or  Organizations: Yes    Attends Banker Meetings: Patient declined    Marital Status: Married    Family History  Problem Relation Age of Onset   Colon cancer Brother    Hypertension Mother    Parkinson's disease Mother    Stroke Brother     Health Maintenance  Topic Date Due   Medicare Annual Wellness (AWV)  Never done   COVID-19 Vaccine (3 - Pfizer risk series) 01/12/2024 (Originally 03/24/2020)   INFLUENZA VACCINE  03/11/2024 (Originally 07/13/2023)   Zoster Vaccines- Shingrix (1 of 2) 03/26/2024 (Originally 10/22/1972)   Lung Cancer Screening  06/25/2024 (Originally 10/23/2003)   Pneumonia Vaccine 75+ Years old (3 of 3 - PPSV23 or PCV20) 06/25/2024 (Originally 08/01/2022)   Colonoscopy  06/25/2024 (Originally 11/29/2018)   DTaP/Tdap/Td (3 - Td or Tdap) 12/26/2024 (Originally 11/15/2022)   Hepatitis C Screening  Completed   HPV VACCINES  Aged Out     ----------------------------------------------------------------------------------------------------------------------------------------------------------------------------------------------------------------- Physical Exam BP 116/65 (BP Location: Left Arm, Patient Position: Sitting)   Pulse 66   Ht 5\' 4"  (1.626 m)   Wt 222 lb 8 oz (100.9 kg)   SpO2 99%   BMI 38.19 kg/m   Physical Exam Constitutional:      Appearance: Normal appearance.  Eyes:     General: No scleral icterus. Cardiovascular:     Rate and Rhythm: Normal rate and regular rhythm.  Pulmonary:     Effort: Pulmonary effort is normal.     Breath sounds: Normal breath sounds.  Neurological:     Mental Status: He is alert.  Psychiatric:        Mood and Affect: Mood normal.        Behavior: Behavior normal.     ------------------------------------------------------------------------------------------------------------------------------------------------------------------------------------------------------------------- Assessment and  Plan  Essential hypertension, benign BP is well controlled with current medications.  Recommend continuation.    Tobacco abuse Reinforced need to completely quit smoking.  Offered assistance.  He has patches and gum.    History of non-ST elevation myocardial infarction (NSTEMI) He is followed by cardiology now.  Continue brilinta and asa.  Continue statin and BB.     No orders of the defined types were placed in this encounter.   Return in about 6 months (around 06/25/2024) for Hypertension.    This visit occurred during the SARS-CoV-2 public health emergency.  Safety protocols were in place, including screening questions prior to the visit, additional usage of staff PPE, and extensive cleaning of exam room while observing appropriate contact time as indicated for disinfecting solutions.

## 2023-12-27 NOTE — Assessment & Plan Note (Signed)
 He is followed by cardiology now.  Continue brilinta and asa.  Continue statin and BB.

## 2023-12-27 NOTE — Assessment & Plan Note (Signed)
BP is well controlled with current medications.  Recommend continuation.   

## 2024-01-02 DIAGNOSIS — I351 Nonrheumatic aortic (valve) insufficiency: Secondary | ICD-10-CM | POA: Diagnosis not present

## 2024-01-02 DIAGNOSIS — Z95820 Peripheral vascular angioplasty status with implants and grafts: Secondary | ICD-10-CM | POA: Diagnosis not present

## 2024-01-02 DIAGNOSIS — I25118 Atherosclerotic heart disease of native coronary artery with other forms of angina pectoris: Secondary | ICD-10-CM | POA: Diagnosis not present

## 2024-01-02 DIAGNOSIS — I214 Non-ST elevation (NSTEMI) myocardial infarction: Secondary | ICD-10-CM | POA: Diagnosis not present

## 2024-01-02 DIAGNOSIS — E7800 Pure hypercholesterolemia, unspecified: Secondary | ICD-10-CM | POA: Diagnosis not present

## 2024-01-02 DIAGNOSIS — I10 Essential (primary) hypertension: Secondary | ICD-10-CM | POA: Diagnosis not present

## 2024-01-09 ENCOUNTER — Ambulatory Visit: Payer: Medicare Other | Admitting: Cardiology

## 2024-02-13 DIAGNOSIS — I214 Non-ST elevation (NSTEMI) myocardial infarction: Secondary | ICD-10-CM | POA: Diagnosis not present

## 2024-02-14 DIAGNOSIS — I214 Non-ST elevation (NSTEMI) myocardial infarction: Secondary | ICD-10-CM | POA: Diagnosis not present

## 2024-02-19 DIAGNOSIS — I214 Non-ST elevation (NSTEMI) myocardial infarction: Secondary | ICD-10-CM | POA: Diagnosis not present

## 2024-02-26 ENCOUNTER — Other Ambulatory Visit: Payer: Self-pay

## 2024-03-01 DIAGNOSIS — I10 Essential (primary) hypertension: Secondary | ICD-10-CM | POA: Diagnosis not present

## 2024-03-01 DIAGNOSIS — E7800 Pure hypercholesterolemia, unspecified: Secondary | ICD-10-CM | POA: Diagnosis not present

## 2024-03-01 DIAGNOSIS — I351 Nonrheumatic aortic (valve) insufficiency: Secondary | ICD-10-CM | POA: Diagnosis not present

## 2024-03-01 DIAGNOSIS — I25118 Atherosclerotic heart disease of native coronary artery with other forms of angina pectoris: Secondary | ICD-10-CM | POA: Diagnosis not present

## 2024-03-01 DIAGNOSIS — I252 Old myocardial infarction: Secondary | ICD-10-CM | POA: Diagnosis not present

## 2024-03-06 DIAGNOSIS — I252 Old myocardial infarction: Secondary | ICD-10-CM | POA: Diagnosis not present

## 2024-03-06 DIAGNOSIS — I25118 Atherosclerotic heart disease of native coronary artery with other forms of angina pectoris: Secondary | ICD-10-CM | POA: Diagnosis not present

## 2024-03-06 DIAGNOSIS — E7800 Pure hypercholesterolemia, unspecified: Secondary | ICD-10-CM | POA: Diagnosis not present

## 2024-06-25 ENCOUNTER — Ambulatory Visit: Payer: Medicare Other | Admitting: Family Medicine

## 2024-06-27 ENCOUNTER — Ambulatory Visit (INDEPENDENT_AMBULATORY_CARE_PROVIDER_SITE_OTHER): Payer: Medicare Other | Admitting: Family Medicine

## 2024-06-27 ENCOUNTER — Encounter: Payer: Self-pay | Admitting: Family Medicine

## 2024-06-27 VITALS — BP 127/72 | HR 85 | Ht 64.0 in | Wt 229.0 lb

## 2024-06-27 DIAGNOSIS — I10 Essential (primary) hypertension: Secondary | ICD-10-CM

## 2024-06-27 DIAGNOSIS — I252 Old myocardial infarction: Secondary | ICD-10-CM

## 2024-06-27 DIAGNOSIS — Z720 Tobacco use: Secondary | ICD-10-CM

## 2024-06-27 DIAGNOSIS — E782 Mixed hyperlipidemia: Secondary | ICD-10-CM

## 2024-06-27 MED ORDER — NICOTINE 7 MG/24HR TD PT24: 7.0000 mg | MEDICATED_PATCH | Freq: Every day | TRANSDERMAL | 0 refills | Status: DC

## 2024-06-27 MED ORDER — NICOTINE 14 MG/24HR TD PT24: 14.0000 mg | MEDICATED_PATCH | Freq: Every day | TRANSDERMAL | 0 refills | Status: DC

## 2024-06-27 MED ORDER — NICOTINE 21 MG/24HR TD PT24: 21.0000 mg | MEDICATED_PATCH | Freq: Every day | TRANSDERMAL | 0 refills | Status: DC

## 2024-06-27 NOTE — Assessment & Plan Note (Signed)
 Tolerating crestor well at current strength.

## 2024-06-27 NOTE — Assessment & Plan Note (Signed)
 Counseled extensively on the need to quit.  Discussed increased risk of additional MI and stroke if he continues to smoke.

## 2024-06-27 NOTE — Assessment & Plan Note (Signed)
BP is well controlled with current medications.  Recommend continuation.   

## 2024-06-27 NOTE — Assessment & Plan Note (Signed)
 He will continue to see cardiology.  Remains on DAPT with plavix and asa.

## 2024-06-27 NOTE — Progress Notes (Signed)
 Erik Estrada - 73 y.o. male MRN [id number]  Date of birth: September 12, 1951  Subjective Chief Complaint  Patient presents with   Hypertension    HPI Erik Estrada is a 73 y.o. male here today for follow up visit.  He reports that he is doing ok.   He remains on losartan  and metoprolol for management of HTN.  S/p NSTEMI in December 2024.  Continues to see cardiology.  He remains on DAPT with ASA and Plavix. He has not had new episodes of chest pain,   Tolerating crestor well at current strength.   He does continue to smoke.   ROS:  A comprehensive ROS was completed and negative except as noted per HPI  Allergies  Allergen Reactions   Atorvastatin  Calcium  Other (See Comments)    Myalgia   Hydrochlorothiazide  Other (See Comments)    Fatigue   Lipitor [Atorvastatin  Calcium ] Other (See Comments)    Myalgia    Past Medical History:  Diagnosis Date   Anxiety    Arthritis    Essential hypertension, benign 06/29/2011   Hemorrhoids 08/01/2018   History of kidney stones    Morbid obesity (HCC) 10/12/2018   Neurotic excoriations 07/27/2016   Pre-diabetes    Testosterone  deficiency 10/12/2018   Tubular adenoma of colon 10/17/2018   [facility].  Plan for 5-year recheck.   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   Procedure: RIGHT TOTAL HIP ARTHROPLASTY ANTERIOR APPROACH;  Surgeon: Sheril Coy, MD;  Location: [facility];  Service: Orthopedics;  Laterality: Right;   TOTAL KNEE ARTHROPLASTY Left 09/20/2022   Procedure: LEFT TOTAL KNEE ARTHROPLASTY;  Surgeon: Sheril Coy, MD;  Location: [facility];  Service: Orthopedics;  Laterality: Left;   UMBILICAL HERNIA REPAIR      Social History   Socioeconomic History   Marital status: Married    Spouse name: Montie   Number of children: 2   Years  of education: Not on file   Highest education level: 10th grade  Occupational History   Occupation: Adult nurse    Comment: [employer]  Tobacco Use   Smoking status: Every Day    Current packs/day: 1.50    Average packs/day: 1.5 packs/day for 22.0 years (33.0 ttl pk-yrs)    Types: Cigarettes   Smokeless tobacco: Never  Vaping Use   Vaping status: Never Used  Substance and Sexual Activity   Alcohol use: No   Drug use: No   Sexual activity: Yes    Partners: Female  Other Topics Concern   Not on file  Social History Narrative   2 caffeinated drinks daily. No regular exercise he does have a physical job.   Social Drivers of Corporate investment banker Strain: Low Risk  (06/26/2024)   Overall Financial Resource Strain (CARDIA)    Difficulty of Paying Living Expenses: Not very hard  Food Insecurity: No Food Insecurity (06/26/2024)   Hunger Vital Sign    Worried About Running Out of Food in the Last Year: Never true    Ran Out of Food in the Last Year: Never true  Transportation Needs: No Transportation Needs (06/26/2024)   PRAPARE - Administrator, Civil Service (Medical): No    Lack of Transportation (Non-Medical): No  Physical Activity: Insufficiently Active (06/26/2024)   Exercise  Vital Sign    Days of Exercise per Week: 1 day    Minutes of Exercise per Session: 10 min  Stress: Stress Concern Present (06/26/2024)   [employer] of Occupational Health - Occupational Stress Questionnaire    Feeling of Stress: To some extent  Social Connections: Moderately Isolated (06/26/2024)   Social Connection and Isolation Panel    Frequency of Communication with Friends and Family: Twice a week    Frequency of Social Gatherings with Friends and Family: Once a week    Attends Religious Services: Patient declined    Database administrator or Organizations: No    Attends Engineer, structural: Not on file    Marital Status: Married    Family History  Problem  Relation Age of Onset   Colon cancer Brother    Hypertension Mother    Parkinson's disease Mother    Stroke Brother     Health Maintenance  Topic Date Due   Medicare Annual Wellness (AWV)  Never done   Lung Cancer Screening  Never done   Zoster Vaccines- Shingrix (1 of 2) 09/27/2024 (Originally 10/22/1972)   DTaP/Tdap/Td (3 - Td or Tdap) 12/26/2024 (Originally 11/15/2022)   Pneumococcal Vaccine: 50+ Years (3 of 3 - PCV20 or PCV21) 06/27/2025 (Originally 12/14/2023)   Colonoscopy  06/27/2025 (Originally 11/29/2018)   COVID-19 Vaccine (3 - Pfizer risk series) 07/13/2025 (Originally 03/24/2020)   INFLUENZA VACCINE  07/12/2024   Hepatitis C Screening  Completed   Hepatitis B Vaccines  Aged Out   HPV VACCINES  Aged Out   Meningococcal B Vaccine  Aged Out     ----------------------------------------------------------------------------------------------------------------------------------------------------------------------------------------------------------------- Physical Exam BP 127/72   Pulse 85   Ht 5' 4 (1.626 m)   Wt 229 lb (103.9 kg)   SpO2 98%   BMI 39.31 kg/m   Physical Exam Constitutional:      Appearance: Normal appearance.  Eyes:     General: No scleral icterus. Cardiovascular:     Rate and Rhythm: Normal rate and regular rhythm.  Pulmonary:     Effort: Pulmonary effort is normal.     Breath sounds: Normal breath sounds.  Musculoskeletal:     Cervical back: Neck supple.  Neurological:     General: No focal deficit present.     Mental Status: He is alert.  Psychiatric:        Mood and Affect: Mood normal.        Behavior: Behavior normal.     ------------------------------------------------------------------------------------------------------------------------------------------------------------------------------------------------------------------- Assessment and Plan  Tobacco abuse Counseled extensively on the need to quit.  Discussed increased risk of  additional MI and stroke if he continues to smoke.    Hyperlipidemia Tolerating crestor well at current strength.      History of non-ST elevation myocardial infarction (NSTEMI) He will continue to see cardiology.  Remains on DAPT with plavix and asa.    Essential hypertension, benign BP is well controlled with current medications.  Recommend continuation.     Meds ordered this encounter  Medications   nicotine  (NICODERM CQ ) 14 mg/24hr patch    Sig: Place 1 patch (14 mg total) onto the skin daily. Reduce to 7mg  after 2 weeks.    Dispense:  14 patch    Refill:  0   nicotine  (NICODERM CQ ) 21 mg/24hr patch    Sig: Place 1 patch (21 mg total) onto the skin daily. Reduce to 14mg  after 2 weeks.    Dispense:  14 patch    Refill:  0  nicotine  (NICODERM CQ ) 7 mg/24hr patch    Sig: Place 1 patch (7 mg total) onto the skin daily.    Dispense:  28 patch    Refill:  0    Return in about 6 months (around 12/28/2024) for Hypertension.

## 2024-08-13 ENCOUNTER — Encounter: Payer: Self-pay | Admitting: Sports Medicine

## 2024-08-29 DIAGNOSIS — I351 Nonrheumatic aortic (valve) insufficiency: Secondary | ICD-10-CM | POA: Diagnosis not present

## 2024-08-29 DIAGNOSIS — E7800 Pure hypercholesterolemia, unspecified: Secondary | ICD-10-CM | POA: Diagnosis not present

## 2024-08-29 DIAGNOSIS — I252 Old myocardial infarction: Secondary | ICD-10-CM | POA: Diagnosis not present

## 2024-08-29 DIAGNOSIS — Z1330 Encounter for screening examination for mental health and behavioral disorders, unspecified: Secondary | ICD-10-CM | POA: Diagnosis not present

## 2024-08-29 DIAGNOSIS — I25118 Atherosclerotic heart disease of native coronary artery with other forms of angina pectoris: Secondary | ICD-10-CM | POA: Diagnosis not present

## 2024-08-29 DIAGNOSIS — I10 Essential (primary) hypertension: Secondary | ICD-10-CM | POA: Diagnosis not present

## 2024-12-09 ENCOUNTER — Ambulatory Visit: Payer: Self-pay | Admitting: *Deleted

## 2024-12-09 ENCOUNTER — Encounter: Payer: Self-pay | Admitting: Medical-Surgical

## 2024-12-09 ENCOUNTER — Ambulatory Visit: Admitting: Medical-Surgical

## 2024-12-09 VITALS — BP 130/68 | HR 76 | Temp 98.2°F | Resp 20 | Ht 64.0 in | Wt 226.0 lb

## 2024-12-09 DIAGNOSIS — I499 Cardiac arrhythmia, unspecified: Secondary | ICD-10-CM

## 2024-12-09 DIAGNOSIS — J069 Acute upper respiratory infection, unspecified: Secondary | ICD-10-CM

## 2024-12-09 LAB — POC COVID19/FLU A&B COMBO
Covid Antigen, POC: NEGATIVE
Influenza A Antigen, POC: NEGATIVE
Influenza B Antigen, POC: NEGATIVE

## 2024-12-09 MED ORDER — METHYLPREDNISOLONE 4 MG PO TBPK: ORAL_TABLET | ORAL | 0 refills | Status: AC

## 2024-12-09 MED ORDER — HYDROCOD POLI-CHLORPHE POLI ER 10-8 MG/5ML PO SUER: 5.0000 mL | Freq: Two times a day (BID) | ORAL | 0 refills | Status: AC | PRN

## 2024-12-09 NOTE — Telephone Encounter (Signed)
 Spoke with patient. Moved appt to opening with Zada Palin, NP today 12/09/2024 at 2:40pm

## 2024-12-09 NOTE — Telephone Encounter (Signed)
 FYI Only or Action Required?: FYI only for provider: appointment scheduled on 12/11/24.  Patient was last seen in primary care on 06/27/2024 by Alvia Bring, DO.  Called Nurse Triage reporting Cough. Nasal congestion  Symptoms began several days ago. Friday   Interventions attempted: OTC medications: alkazelter, immune packets, humidifier and Rest, hydration, or home remedies.  Symptoms are: gradually worsening.  Triage Disposition: See PCP When Office is Open (Within 3 Days)  Patient/caregiver understands and will follow disposition?: Yes   Patient requesting call back for any other recommendations and earlier appt if possible . Wants to go to work.              Copied from CRM #[id number]. Topic: Clinical - Red Word Triage >> Dec 09, 2024 11:07 AM Mercer PEDLAR wrote: Red Word that prompted transfer to Nurse Triage: productive cough, bad cold, head congestion. Reason for Disposition  [1] Sinus congestion (pressure, fullness) AND [2] present > 10 days  Answer Assessment - Initial Assessment Questions Earliest appt scheduled for 12/11/24. Patient requesting earlier OV if possible. Wants to go to work. On wait list for earlier appt.       1. LOCATION: Where does it hurt?      No pain overall feeling of stuffiness per patient wife on DPR 2. ONSET: When did the sinus pain start?  (e.g., hours, days)      Friday  3. SEVERITY: How bad is the pain?   (Scale 0-10; or none, mild, moderate or severe)     Denies pain  4. RECURRENT SYMPTOM: Have you ever had sinus problems before? If Yes, ask: When was the last time? and What happened that time?      Yes  5. NASAL CONGESTION: Is the nose blocked? If Yes, ask: Can you open it or must you breathe through your mouth?    Nose blocked at times. Can breathe through nose . 6. NASAL DISCHARGE: Do you have discharge from your nose? If so ask, What color?     Yes unsure what color  7. FEVER: Do you have a fever? If  Yes, ask: What is it, how was it measured, and when did it start?      na 8. OTHER SYMPTOMS: Do you have any other symptoms? (e.g., sore throat, cough, earache, difficulty breathing)     Head congestion , cough, deep cough , blowing nose. Can breathe through nose but blocked at times.  9. PREGNANCY: Is there any chance you are pregnant? When was your last menstrual period?     na  Protocols used: Sinus Pain or Congestion-A-AH

## 2024-12-09 NOTE — Progress Notes (Signed)
" ° °       Established patient visit   History of Present Illness   Discussed the use of AI scribe software for clinical note transcription with the patient, who gave verbal consent to proceed.  History of Present Illness   Erik Estrada is a 73 year old male with a history of heart attack and high blood pressure who presents with sinus congestion and cough.  Upper respiratory symptoms - Significant sinus congestion with persistent productive cough - Sensation of head fullness affecting inner ear - Dizziness, especially at night - No headache - Weakness and fatigue attributed to poor sleep - Subjective fevers at night that break by morning - Wakes with a wet pillow - Wife has similar symptoms  Gastrointestinal symptoms - Constipation treated with small laxative pills, usually effective within four hours - Consumes fruits such as strawberries, blackberries, and bananas a few times per week for fiber - Suspects medications contribute to constipation - Decreased water intake in colder weather - No stomach pain, nausea, vomiting, or diarrhea  Cardiovascular symptoms - History of myocardial infarction and hypertension - Occasional irregular heart rate with exertion, such as walking uphill or quickly - Uses a smartwatch to monitor and alert for elevated heart rate    Physical Exam   Physical Exam Vitals and nursing note reviewed.  Constitutional:      General: He is not in acute distress.    Appearance: Normal appearance.  HENT:     Head: Normocephalic and atraumatic.  Cardiovascular:     Rate and Rhythm: Normal rate. Rhythm irregular.     Pulses: Normal pulses.     Heart sounds: Normal heart sounds. No murmur heard.    No friction rub. No gallop.  Pulmonary:     Effort: Pulmonary effort is normal. No respiratory distress.     Breath sounds: Normal breath sounds.  Skin:    General: Skin is warm and dry.  Neurological:     Mental Status: He is alert and oriented to  person, place, and time.     Gait: Gait normal.  Psychiatric:        Mood and Affect: Mood normal.        Behavior: Behavior normal.        Thought Content: Thought content normal.        Judgment: Judgment normal.    Assessment & Plan     Acute upper respiratory infection Sinus congestion and cough, likely viral. Lungs clear with mild intermittent wheezing. Negative for flu and COVID. Symptoms consistent with common cold. - Prescribed Medrol  Dosepak for symptom management. - Adding Tussionex BID prn cough management.  Cardiac arrhythmia in a patient with history of myocardial infarction Irregular heart rate with occasional PVCs. EKG shows sinus rhythm with occasional PVCs. No acute cardiac distress. History of myocardial infarction and hypertension. Device indicates irregularity. - Performed EKG- sinus rhythm, occasional PVCs, rate 74, normal axis, no red flags or acute changes.   Constipation Intermittent constipation, possibly medication-related. Adequate dietary fiber intake, water intake may be insufficient. - Continue dietary fiber intake.  - Increase daily fluid intake.  - Consider use of Miralax daily prn for constipation.     Follow up   Return if symptoms worsen or fail to improve. __________________________________ Zada FREDRIK Palin, DNP, APRN, FNP-BC Primary Care and Sports Medicine [facility] "

## 2024-12-11 ENCOUNTER — Ambulatory Visit: Admitting: Family Medicine

## 2024-12-15 ENCOUNTER — Encounter: Payer: Self-pay | Admitting: Family Medicine

## 2024-12-26 ENCOUNTER — Ambulatory Visit: Admitting: Family Medicine

## 2024-12-26 ENCOUNTER — Encounter: Payer: Self-pay | Admitting: Family Medicine

## 2024-12-26 VITALS — BP 128/69 | HR 56 | Ht 64.0 in | Wt 225.0 lb

## 2024-12-26 DIAGNOSIS — R339 Retention of urine, unspecified: Secondary | ICD-10-CM | POA: Diagnosis not present

## 2024-12-26 DIAGNOSIS — R7303 Prediabetes: Secondary | ICD-10-CM | POA: Diagnosis not present

## 2024-12-26 DIAGNOSIS — E7800 Pure hypercholesterolemia, unspecified: Secondary | ICD-10-CM | POA: Diagnosis not present

## 2024-12-26 DIAGNOSIS — I10 Essential (primary) hypertension: Secondary | ICD-10-CM

## 2024-12-26 MED ORDER — DOXYCYCLINE HYCLATE 100 MG PO TABS: 100.0000 mg | ORAL_TABLET | Freq: Two times a day (BID) | ORAL | 0 refills | Status: AC

## 2024-12-26 NOTE — Assessment & Plan Note (Signed)
BP is well controlled with current medications.  Recommend continuation.   

## 2024-12-26 NOTE — Assessment & Plan Note (Signed)
Tolerating crestor well at current strength.  Update lipid panel.

## 2024-12-26 NOTE — Progress Notes (Signed)
 " Erik Estrada - 74 y.o. male MRN [id number]  Date of birth: 1951-07-20  Subjective Chief Complaint  Patient presents with   Hypertension   Nasal Congestion    HPI Erik Estrada is a 74 y.o. male here today for follow up visit.   He reports that he is doing ok.   Still has a lot of drainage from recent cold.  Seen in clinic about 2 weeks ago.  Treated with medrol  dosepak and had improvement with this but symptoms have worsened again.  Coughing up thick, yellow mucus at this time.  Denies dyspnea or wheezing.  He does continue to smoke.    He remains on losartan  for management of HTN.  BP is mildly elevated.  Denies chest pain, shortness of breath, palpitations, headache or vision changes.   He feels that his urine stream is diminished recently.  He feels that he has restriction with urinating.  No blood in urine.  Had some flomax  at home which he tried and this helped quite a bit.    ROS:  A comprehensive ROS was completed and negative except as noted per HPI  Allergies[1]  Past Medical History:  Diagnosis Date   Anxiety    Arthritis    Essential hypertension, benign 06/29/2011   Hemorrhoids 08/01/2018   History of kidney stones    Morbid obesity (HCC) 10/12/2018   Neurotic excoriations 07/27/2016   Pre-diabetes    Testosterone  deficiency 10/12/2018   Tubular adenoma of colon 10/17/2018   [facility]  Plan for 5-year recheck.   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   Procedure: RIGHT TOTAL HIP ARTHROPLASTY ANTERIOR APPROACH;  Surgeon: Sheril Coy, MD;  Location: [facility];  Service: Orthopedics;  Laterality: Right;   TOTAL KNEE ARTHROPLASTY Left 09/20/2022   Procedure: LEFT TOTAL KNEE ARTHROPLASTY;  Surgeon: Sheril Coy, MD;  Location: [facility];  Service: Orthopedics;   Laterality: Left;   UMBILICAL HERNIA REPAIR      Social History   Socioeconomic History   Marital status: Married    Spouse name: Montie   Number of children: 2   Years of education: Not on file   Highest education level: 10th grade  Occupational History   Occupation: Adult Nurse    Comment: [employer]  Tobacco Use   Smoking status: Every Day    Current packs/day: 1.50    Average packs/day: 1.5 packs/day for 22.0 years (33.0 ttl pk-yrs)    Types: Cigarettes   Smokeless tobacco: Never  Vaping Use   Vaping status: Never Used  Substance and Sexual Activity   Alcohol use: No   Drug use: No   Sexual activity: Yes    Partners: Female  Other Topics Concern   Not on file  Social History Narrative   2 caffeinated drinks daily. No regular exercise he does have a physical job.   Social Drivers of Health   Tobacco Use: High Risk (12/26/2024)   Patient History    Smoking Tobacco Use: Every Day    Smokeless Tobacco Use: Never    Passive Exposure: Not on file  Financial Resource Strain: Low Risk (12/25/2024)   Overall Financial Resource Strain (CARDIA)    Difficulty of Paying Living Expenses: Not hard at all  Food Insecurity: No Food Insecurity (12/25/2024)  Epic    Worried About Programme Researcher, Broadcasting/film/video in the Last Year: Never true    [employer] of Food in the Last Year: Never true  Transportation Needs: No Transportation Needs (12/25/2024)   Epic    Lack of Transportation (Medical): No    Lack of Transportation (Non-Medical): No  Physical Activity: Unknown (12/25/2024)   Exercise Vital Sign    Days of Exercise per Week: Patient declined    Minutes of Exercise per Session: Not on file  Stress: Stress Concern Present (12/25/2024)   [employer] of Occupational Health - Occupational Stress Questionnaire    Feeling of Stress: To some extent  Social Connections: Moderately Isolated (12/25/2024)   Social Connection and Isolation Panel    Frequency of Communication with Friends  and Family: More than three times a week    Frequency of Social Gatherings with Friends and Family: Never    Attends Religious Services: Patient declined    Database Administrator or Organizations: No    Attends Engineer, Structural: Not on file    Marital Status: Married  Depression (PHQ2-9): Low Risk (06/27/2024)   Depression (PHQ2-9)    PHQ-2 Score: 0  Alcohol Screen: Low Risk (06/27/2024)   Alcohol Screen    Last Alcohol Screening Score (AUDIT): 1  Housing: Unknown (12/25/2024)   Epic    Unable to Pay for Housing in the Last Year: No    Number of Times Moved in the Last Year: Not on file    Homeless in the Last Year: No  Utilities: Not At Risk (01/02/2024)   Received from [facility] Utilities    Threatened with loss of utilities: No  Health Literacy: Inadequate Health Literacy (06/27/2024)   B1300 Health Literacy    Frequency of need for help with medical instructions: Often    Family History  Problem Relation Age of Onset   Colon cancer Brother    Hypertension Mother    Parkinson's disease Mother    Stroke Brother     Health Maintenance  Topic Date Due   Medicare Annual Wellness (AWV)  Never done   Lung Cancer Screening  Never done   DTaP/Tdap/Td (3 - Td or Tdap) 12/26/2024 (Originally 11/15/2022)   Influenza Vaccine  03/11/2025 (Originally 07/12/2024)   Zoster Vaccines- Shingrix (1 of 2) 03/26/2025 (Originally 10/22/1970)   Pneumococcal Vaccine: 50+ Years (3 of 3 - PCV20 or PCV21) 06/27/2025 (Originally 12/14/2023)   Colonoscopy  06/27/2025 (Originally 11/29/2018)   COVID-19 Vaccine (3 - Pfizer risk series) 07/13/2025 (Originally 03/24/2020)   Hepatitis C Screening  Completed   Meningococcal B Vaccine  Aged Out     ----------------------------------------------------------------------------------------------------------------------------------------------------------------------------------------------------------------- Physical Exam BP 128/69   Pulse  (!) 56   Ht 5' 4 (1.626 m)   Wt 225 lb (102.1 kg)   SpO2 98%   BMI 38.62 kg/m   Physical Exam Constitutional:      Appearance: Normal appearance.  HENT:     Head: Normocephalic and atraumatic.  Eyes:     General: No scleral icterus. Cardiovascular:     Rate and Rhythm: Normal rate and regular rhythm.  Pulmonary:     Effort: Pulmonary effort is normal.     Breath sounds: Normal breath sounds.  Musculoskeletal:     Cervical back: Neck supple.  Neurological:     Mental Status: He is alert.  Psychiatric:        Mood and Affect: Mood normal.  Behavior: Behavior normal.     ------------------------------------------------------------------------------------------------------------------------------------------------------------------------------------------------------------------- Assessment and Plan  Essential hypertension, benign BP is well controlled with current medications.  Recommend continuation.    Prediabetes Repeat A1c today.  Hyperlipidemia Tolerating crestor well at current strength.  Update lipid panel.    Urine retention Checking urinalysis and PSA.  He may continue Flomax  at home.   Meds ordered this encounter  Medications   doxycycline  (VIBRA -TABS) 100 MG tablet    Sig: Take 1 tablet (100 mg total) by mouth 2 (two) times daily.    Dispense:  20 tablet    Refill:  0    No follow-ups on file.        [1]  Allergies Allergen Reactions   Atorvastatin  Calcium  Other (See Comments)    Myalgia   Hydrochlorothiazide  Other (See Comments)    Fatigue   Lipitor [Atorvastatin  Calcium ] Other (See Comments)    Myalgia   "

## 2024-12-26 NOTE — Assessment & Plan Note (Signed)
 Repeat A1c today

## 2024-12-26 NOTE — Assessment & Plan Note (Signed)
 Checking urinalysis and PSA.  He may continue Flomax  at home.

## 2024-12-28 LAB — URINALYSIS, ROUTINE W REFLEX MICROSCOPIC
Bilirubin, UA: NEGATIVE
Glucose, UA: NEGATIVE
Ketones, UA: NEGATIVE
Nitrite, UA: NEGATIVE
Protein,UA: NEGATIVE
RBC, UA: NEGATIVE
Specific Gravity, UA: 1.012 (ref 1.005–1.030)
Urobilinogen, Ur: 0.2 mg/dL (ref 0.2–1.0)
pH, UA: 8.5 — ABNORMAL HIGH (ref 5.0–7.5)

## 2024-12-28 LAB — CBC WITH DIFFERENTIAL/PLATELET
Basophils Absolute: 0.1 x10E3/uL (ref 0.0–0.2)
Basos: 1 %
EOS (ABSOLUTE): 0.2 x10E3/uL (ref 0.0–0.4)
Eos: 2 %
Hematocrit: 47.7 % (ref 37.5–51.0)
Hemoglobin: 15.2 g/dL (ref 13.0–17.7)
Immature Grans (Abs): 0 x10E3/uL (ref 0.0–0.1)
Immature Granulocytes: 0 %
Lymphocytes Absolute: 1.4 x10E3/uL (ref 0.7–3.1)
Lymphs: 15 %
MCH: 31.1 pg (ref 26.6–33.0)
MCHC: 31.9 g/dL (ref 31.5–35.7)
MCV: 98 fL — ABNORMAL HIGH (ref 79–97)
Monocytes Absolute: 1 x10E3/uL — ABNORMAL HIGH (ref 0.1–0.9)
Monocytes: 10 %
Neutrophils Absolute: 6.7 x10E3/uL (ref 1.4–7.0)
Neutrophils: 72 %
Platelets: 236 x10E3/uL (ref 150–450)
RBC: 4.88 x10E6/uL (ref 4.14–5.80)
RDW: 12.6 % (ref 11.6–15.4)
WBC: 9.4 x10E3/uL (ref 3.4–10.8)

## 2024-12-28 LAB — CMP14+EGFR
ALT: 19 IU/L (ref 0–44)
AST: 22 IU/L (ref 0–40)
Albumin: 4.3 g/dL (ref 3.8–4.8)
Alkaline Phosphatase: 76 IU/L (ref 47–123)
BUN/Creatinine Ratio: 8 — ABNORMAL LOW (ref 10–24)
BUN: 9 mg/dL (ref 8–27)
Bilirubin Total: 0.5 mg/dL (ref 0.0–1.2)
CO2: 24 mmol/L (ref 20–29)
Calcium: 9.8 mg/dL (ref 8.6–10.2)
Chloride: 100 mmol/L (ref 96–106)
Creatinine, Ser: 1.07 mg/dL (ref 0.76–1.27)
Globulin, Total: 2.4 g/dL (ref 1.5–4.5)
Glucose: 119 mg/dL — ABNORMAL HIGH (ref 70–99)
Potassium: 5.2 mmol/L (ref 3.5–5.2)
Sodium: 135 mmol/L (ref 134–144)
Total Protein: 6.7 g/dL (ref 6.0–8.5)
eGFR: 74 mL/min/1.73

## 2024-12-28 LAB — LIPID PANEL WITH LDL/HDL RATIO
Cholesterol, Total: 104 mg/dL (ref 100–199)
HDL: 34 mg/dL — ABNORMAL LOW
LDL Chol Calc (NIH): 52 mg/dL (ref 0–99)
LDL/HDL Ratio: 1.5 ratio (ref 0.0–3.6)
Triglycerides: 94 mg/dL (ref 0–149)
VLDL Cholesterol Cal: 18 mg/dL (ref 5–40)

## 2024-12-28 LAB — URINE CULTURE

## 2024-12-28 LAB — HEMOGLOBIN A1C
Est. average glucose Bld gHb Est-mCnc: 154 mg/dL
Hgb A1c MFr Bld: 7 % — ABNORMAL HIGH (ref 4.8–5.6)

## 2024-12-28 LAB — PSA: Prostate Specific Ag, Serum: 4.1 ng/mL — ABNORMAL HIGH (ref 0.0–4.0)

## 2024-12-28 LAB — MICROSCOPIC EXAMINATION
Bacteria, UA: NONE SEEN
Casts: NONE SEEN /LPF
Epithelial Cells (non renal): NONE SEEN /HPF (ref 0–10)

## 2024-12-30 ENCOUNTER — Ambulatory Visit: Payer: Self-pay | Admitting: Medical-Surgical

## 2025-06-26 ENCOUNTER — Ambulatory Visit

## 2025-06-26 ENCOUNTER — Ambulatory Visit: Admitting: Family Medicine
